# Patient Record
Sex: Female | Born: 1935 | Race: White | Hispanic: No | Marital: Single | Smoking: Never smoker
Health system: Southern US, Community
[De-identification: ages and names within clinical notes are randomized; demographics above are authoritative.]

## PROBLEM LIST (undated history)

## (undated) DIAGNOSIS — I4891 Unspecified atrial fibrillation: Secondary | ICD-10-CM

## (undated) DIAGNOSIS — E785 Hyperlipidemia, unspecified: Secondary | ICD-10-CM

## (undated) DIAGNOSIS — I10 Essential (primary) hypertension: Secondary | ICD-10-CM

## (undated) DIAGNOSIS — G4733 Obstructive sleep apnea (adult) (pediatric): Secondary | ICD-10-CM

---

## 2018-01-17 ENCOUNTER — Encounter (HOSPITAL_COMMUNITY): Payer: Self-pay | Admitting: Internal Medicine

## 2018-01-17 ENCOUNTER — Emergency Department (HOSPITAL_COMMUNITY): Payer: Medicare Other

## 2018-01-17 ENCOUNTER — Inpatient Hospital Stay (HOSPITAL_COMMUNITY)
Admission: EM | Admit: 2018-01-17 | Discharge: 2018-01-30 | DRG: 291 | Disposition: A | Discharge status: Skilled Nursing Facility | Payer: Medicare Other | Attending: Internal Medicine | Admitting: Internal Medicine

## 2018-01-17 DIAGNOSIS — I4819 Other persistent atrial fibrillation: Secondary | ICD-10-CM | POA: Diagnosis present

## 2018-01-17 DIAGNOSIS — I110 Hypertensive heart disease with heart failure: Secondary | ICD-10-CM | POA: Diagnosis not present

## 2018-01-17 DIAGNOSIS — Z79899 Other long term (current) drug therapy: Secondary | ICD-10-CM

## 2018-01-17 DIAGNOSIS — I6529 Occlusion and stenosis of unspecified carotid artery: Secondary | ICD-10-CM | POA: Diagnosis present

## 2018-01-17 DIAGNOSIS — Z7401 Bed confinement status: Secondary | ICD-10-CM

## 2018-01-17 DIAGNOSIS — I878 Other specified disorders of veins: Secondary | ICD-10-CM | POA: Diagnosis present

## 2018-01-17 DIAGNOSIS — I4891 Unspecified atrial fibrillation: Secondary | ICD-10-CM | POA: Diagnosis present

## 2018-01-17 DIAGNOSIS — Z6835 Body mass index (BMI) 35.0-35.9, adult: Secondary | ICD-10-CM

## 2018-01-17 DIAGNOSIS — I493 Ventricular premature depolarization: Secondary | ICD-10-CM | POA: Diagnosis present

## 2018-01-17 DIAGNOSIS — D649 Anemia, unspecified: Secondary | ICD-10-CM | POA: Diagnosis present

## 2018-01-17 DIAGNOSIS — I509 Heart failure, unspecified: Secondary | ICD-10-CM

## 2018-01-17 DIAGNOSIS — I10 Essential (primary) hypertension: Secondary | ICD-10-CM | POA: Diagnosis present

## 2018-01-17 DIAGNOSIS — I6930 Unspecified sequelae of cerebral infarction: Secondary | ICD-10-CM

## 2018-01-17 DIAGNOSIS — I2729 Other secondary pulmonary hypertension: Secondary | ICD-10-CM | POA: Diagnosis present

## 2018-01-17 DIAGNOSIS — I69392 Facial weakness following cerebral infarction: Secondary | ICD-10-CM

## 2018-01-17 DIAGNOSIS — Z8249 Family history of ischemic heart disease and other diseases of the circulatory system: Secondary | ICD-10-CM

## 2018-01-17 DIAGNOSIS — G8929 Other chronic pain: Secondary | ICD-10-CM | POA: Diagnosis present

## 2018-01-17 DIAGNOSIS — Z7901 Long term (current) use of anticoagulants: Secondary | ICD-10-CM

## 2018-01-17 DIAGNOSIS — Z885 Allergy status to narcotic agent status: Secondary | ICD-10-CM

## 2018-01-17 DIAGNOSIS — E785 Hyperlipidemia, unspecified: Secondary | ICD-10-CM | POA: Diagnosis present

## 2018-01-17 DIAGNOSIS — J90 Pleural effusion, not elsewhere classified: Secondary | ICD-10-CM | POA: Diagnosis present

## 2018-01-17 DIAGNOSIS — I69354 Hemiplegia and hemiparesis following cerebral infarction affecting left non-dominant side: Secondary | ICD-10-CM

## 2018-01-17 DIAGNOSIS — R001 Bradycardia, unspecified: Secondary | ICD-10-CM | POA: Clinically undetermined

## 2018-01-17 DIAGNOSIS — Z79891 Long term (current) use of opiate analgesic: Secondary | ICD-10-CM

## 2018-01-17 DIAGNOSIS — J9601 Acute respiratory failure with hypoxia: Secondary | ICD-10-CM | POA: Diagnosis present

## 2018-01-17 DIAGNOSIS — E669 Obesity, unspecified: Secondary | ICD-10-CM | POA: Diagnosis present

## 2018-01-17 DIAGNOSIS — I5082 Biventricular heart failure: Secondary | ICD-10-CM | POA: Diagnosis present

## 2018-01-17 DIAGNOSIS — K5900 Constipation, unspecified: Secondary | ICD-10-CM | POA: Diagnosis not present

## 2018-01-17 DIAGNOSIS — I5033 Acute on chronic diastolic (congestive) heart failure: Secondary | ICD-10-CM | POA: Diagnosis present

## 2018-01-17 DIAGNOSIS — G4733 Obstructive sleep apnea (adult) (pediatric): Secondary | ICD-10-CM | POA: Diagnosis present

## 2018-01-17 DIAGNOSIS — Z9981 Dependence on supplemental oxygen: Secondary | ICD-10-CM

## 2018-01-17 DIAGNOSIS — I080 Rheumatic disorders of both mitral and aortic valves: Secondary | ICD-10-CM | POA: Diagnosis present

## 2018-01-17 DIAGNOSIS — R0602 Shortness of breath: Secondary | ICD-10-CM | POA: Diagnosis not present

## 2018-01-17 DIAGNOSIS — E876 Hypokalemia: Secondary | ICD-10-CM | POA: Diagnosis present

## 2018-01-17 DIAGNOSIS — Z888 Allergy status to other drugs, medicaments and biological substances status: Secondary | ICD-10-CM

## 2018-01-17 HISTORY — DX: Hyperlipidemia, unspecified: E78.5

## 2018-01-17 HISTORY — DX: Essential (primary) hypertension: I10

## 2018-01-17 HISTORY — DX: Unspecified atrial fibrillation: I48.91

## 2018-01-17 HISTORY — DX: Obstructive sleep apnea (adult) (pediatric): G47.33

## 2018-01-17 LAB — CBC WITH DIFFERENTIAL/PLATELET
Abs Immature Granulocytes: 0.07 10*3/uL (ref 0.00–0.07)
Basophils Absolute: 0 10*3/uL (ref 0.0–0.1)
Basophils Relative: 0 %
Eosinophils Absolute: 0.4 10*3/uL (ref 0.0–0.5)
Eosinophils Relative: 4 %
HCT: 35.7 % — ABNORMAL LOW (ref 36.0–46.0)
Hemoglobin: 11 g/dL — ABNORMAL LOW (ref 12.0–15.0)
IMMATURE GRANULOCYTES: 1 %
Lymphocytes Relative: 14 %
Lymphs Abs: 1.3 10*3/uL (ref 0.7–4.0)
MCH: 27.2 pg (ref 26.0–34.0)
MCHC: 30.8 g/dL (ref 30.0–36.0)
MCV: 88.1 fL (ref 80.0–100.0)
Monocytes Absolute: 0.9 10*3/uL (ref 0.1–1.0)
Monocytes Relative: 10 %
Neutro Abs: 6.9 10*3/uL (ref 1.7–7.7)
Neutrophils Relative %: 71 %
Platelets: 239 10*3/uL (ref 150–400)
RBC: 4.05 MIL/uL (ref 3.87–5.11)
RDW: 14.2 % (ref 11.5–15.5)
WBC: 9.6 10*3/uL (ref 4.0–10.5)
nRBC: 0 % (ref 0.0–0.2)

## 2018-01-17 LAB — BASIC METABOLIC PANEL
ANION GAP: 12 (ref 5–15)
BUN: 15 mg/dL (ref 8–23)
CALCIUM: 8.5 mg/dL — AB (ref 8.9–10.3)
CO2: 24 mmol/L (ref 22–32)
Chloride: 107 mmol/L (ref 98–111)
Creatinine, Ser: 0.64 mg/dL (ref 0.44–1.00)
GFR calc Af Amer: >60 mL/min (ref >60)
GFR calc non Af Amer: >60 mL/min (ref >60)
Glucose, Bld: 85 mg/dL (ref 70–99)
Potassium: 3.8 mmol/L (ref 3.5–5.1)
Sodium: 143 mmol/L (ref 135–145)

## 2018-01-17 LAB — I-STAT TROPONIN, ED: Troponin i, poc: 0 ng/mL (ref 0.00–0.08)

## 2018-01-17 LAB — BRAIN NATRIURETIC PEPTIDE: B Natriuretic Peptide: 295.6 pg/mL — ABNORMAL HIGH (ref 0.0–100.0)

## 2018-01-17 MED ORDER — ATORVASTATIN CALCIUM 40 MG PO TABS
40.0000 mg | ORAL_TABLET | Freq: Every evening | ORAL | Status: DC
  Administered 2018-01-18 – 2018-01-29 (×10): 40 mg via ORAL
  Filled 2018-01-17 (×11): qty 1

## 2018-01-17 MED ORDER — SODIUM CHLORIDE 0.9% FLUSH
3.0000 mL | Freq: Two times a day (BID) | INTRAVENOUS | Status: DC
  Administered 2018-01-17 – 2018-01-28 (×19): 3 mL via INTRAVENOUS

## 2018-01-17 MED ORDER — ACETAMINOPHEN 650 MG RE SUPP: 650.0000 mg | Freq: Four times a day (QID) | RECTAL | Status: DC | PRN

## 2018-01-17 MED ORDER — ACETAMINOPHEN 325 MG PO TABS
650.0000 mg | ORAL_TABLET | Freq: Four times a day (QID) | ORAL | Status: DC | PRN
  Administered 2018-01-21 – 2018-01-29 (×2): 650 mg via ORAL
  Filled 2018-01-17 (×3): qty 2

## 2018-01-17 MED ORDER — FUROSEMIDE 10 MG/ML IJ SOLN
80.0000 mg | Freq: Once | INTRAMUSCULAR | Status: AC
  Administered 2018-01-17: 80 mg via INTRAVENOUS
  Filled 2018-01-17: qty 8

## 2018-01-17 MED ORDER — PANTOPRAZOLE SODIUM 40 MG PO TBEC
40.0000 mg | DELAYED_RELEASE_TABLET | Freq: Every day | ORAL | Status: DC
  Administered 2018-01-18 – 2018-01-30 (×13): 40 mg via ORAL
  Filled 2018-01-17 (×13): qty 1

## 2018-01-17 MED ORDER — POTASSIUM CHLORIDE CRYS ER 20 MEQ PO TBCR
20.0000 meq | EXTENDED_RELEASE_TABLET | Freq: Every day | ORAL | Status: DC
  Administered 2018-01-18 – 2018-01-20 (×3): 20 meq via ORAL
  Filled 2018-01-17 (×4): qty 1

## 2018-01-17 MED ORDER — FUROSEMIDE 10 MG/ML IJ SOLN
80.0000 mg | Freq: Two times a day (BID) | INTRAMUSCULAR | Status: DC
  Administered 2018-01-18 – 2018-01-20 (×6): 80 mg via INTRAVENOUS
  Filled 2018-01-17 (×7): qty 8

## 2018-01-17 MED ORDER — APIXABAN 2.5 MG PO TABS
2.5000 mg | ORAL_TABLET | Freq: Two times a day (BID) | ORAL | Status: DC
  Administered 2018-01-18 – 2018-01-21 (×8): 2.5 mg via ORAL
  Filled 2018-01-17 (×8): qty 1

## 2018-01-17 MED ORDER — PREGABALIN 25 MG PO CAPS
25.0000 mg | ORAL_CAPSULE | Freq: Two times a day (BID) | ORAL | Status: DC
  Administered 2018-01-18 – 2018-01-30 (×26): 25 mg via ORAL
  Filled 2018-01-17 (×26): qty 1

## 2018-01-17 MED ORDER — ALBUTEROL SULFATE (2.5 MG/3ML) 0.083% IN NEBU: 3.0000 mL | INHALATION_SOLUTION | RESPIRATORY_TRACT | Status: DC | PRN

## 2018-01-17 MED ORDER — OXYCODONE-ACETAMINOPHEN 10-325 MG PO TABS: 1.0000 | ORAL_TABLET | Freq: Two times a day (BID) | ORAL | Status: DC

## 2018-01-17 MED ORDER — AMITRIPTYLINE HCL 10 MG PO TABS
10.0000 mg | ORAL_TABLET | Freq: Every day | ORAL | Status: DC
  Administered 2018-01-18 – 2018-01-29 (×13): 10 mg via ORAL
  Filled 2018-01-17 (×13): qty 1

## 2018-01-17 MED ORDER — AMLODIPINE BESYLATE 5 MG PO TABS
5.0000 mg | ORAL_TABLET | Freq: Every day | ORAL | Status: DC
  Administered 2018-01-18 – 2018-01-22 (×5): 5 mg via ORAL
  Filled 2018-01-17 (×5): qty 1

## 2018-01-17 NOTE — H&P (Signed)
History and Physical    Margaret Collier WJX:[id number] DOB: Sep 17, 1931 DOA: 01/17/2018  PCP: System, Pcp Not In  Patient coming from: [facility] nursing facility  I have personally briefly reviewed patient's old medical records in [facility]  Chief Complaint: Shortness of breath  HPI: Margaret Collier is a 84 y.o. female with medical history significant for history of CVA with residual left-sided paralysis, atrial fibrillation on Eliquis, hypertension, hyperlipidemia, reported OSA who presents from her nursing facility with progressive dyspnea and peripheral edema not improved with oral Lasix.  Patient reportedly had a chest x-ray done at her nursing facility which was suggestive of pneumonia.  She was started on Levaquin yesterday however transferred to ED due to continued shortness of breath.  Patient reports about 1 week of progressive shortness of breath associated with dry cough.  She says she has been receiving Lasix and reports good urine output without significant improvement.  She has had worsening peripheral edema.  She denies any chest pain, palpitations, fever, chills, diaphoresis, abdominal pain, or dysuria.  ED Course:  Initial vitals showed BP 121/39, pulse 62, RR 15, temp 80F, SPO2 99% on room air.  Labs are notable for BNP 295.6, negative i-STAT troponin.  CBC and BMP were largely unremarkable.  Patient became hypoxic in the ED requiring supplemental oxygen with improvement.  Chest x-ray showed right hemidiaphragm elevation with possible small left pleural effusion.  She was given Lasix 80 mg once and the hospital service was consulted for suspected CHF exacerbation.  Review of Systems: As per HPI otherwise 10 point review of systems negative.    Past Medical History:  Diagnosis Date  . Atrial fibrillation (HCC)   . Hyperlipidemia   . Hypertension   . OSA (obstructive sleep apnea)     History reviewed. No pertinent surgical history.   reports that she has never  smoked. She has never used smokeless tobacco. No history on file for alcohol and drug.  Allergies  Allergen Reactions  . Chlorthalidone     On MAR  . Dilaudid [Hydromorphone Hcl]     On MAR  . Fosamax [Alendronate Sodium]     On MAR  . Zanaflex [Tizanidine Hcl]     On MAR    Family History  Problem Relation Age of Onset  . Heart disease Mother      Prior to Admission medications   Medication Sig Start Date End Date Taking? Authorizing Provider  albuterol (PROVENTIL HFA;VENTOLIN HFA) 108 (90 Base) MCG/ACT inhaler Inhale 2 puffs into the lungs every 4 (four) hours as needed for wheezing or shortness of breath.   Yes [provider]  amitriptyline (ELAVIL) 10 MG tablet Take 10 mg by mouth at bedtime.   Yes [provider]  amLODipine (NORVASC) 5 MG tablet Take 5 mg by mouth 2 (two) times daily.   Yes [provider]  apixaban (ELIQUIS) 2.5 MG TABS tablet Take 2.5 mg by mouth 2 (two) times daily.   Yes [provider]  atorvastatin (LIPITOR) 40 MG tablet Take 40 mg by mouth every evening.   Yes [provider]  b complex vitamins capsule Take 1 capsule by mouth daily.   Yes [provider]  calcium carbonate (TUMS - DOSED IN MG ELEMENTAL CALCIUM) 500 MG chewable tablet Chew 1 tablet by mouth 3 (three) times daily before meals.   Yes [provider]  chlorhexidine (PERIDEX) 0.12 % solution Use as directed 15 mLs in the mouth or throat 2 (two) times daily.  Yes [provider]  cholecalciferol (VITAMIN D3) 25 MCG (1000 UT) tablet Take 1,000 Units by mouth daily.   Yes [provider]  furosemide (LASIX) 40 MG tablet Take 40 mg by mouth daily.   Yes [provider]  ipratropium-albuterol (DUONEB) 0.5-2.5 (3) MG/3ML SOLN Take 3 mLs by nebulization 3 (three) times daily. x7 days.   Yes [provider]  levofloxacin (LEVAQUIN) 500 MG tablet Take 500 mg by mouth daily. x7 days.   Yes [provider]  oxyCODONE-acetaminophen (PERCOCET) 10-325 MG tablet Take 1 tablet by mouth every 12 (twelve) hours.   Yes [provider]  OXYGEN Inhale 2 L/min into the lungs at bedtime.   Yes [provider]  pantoprazole (PROTONIX) 40 MG tablet Take 40 mg by mouth daily.   Yes [provider]  potassium chloride SA (K-DUR,KLOR-CON) 20 MEQ tablet Take 20 mEq by mouth daily.   Yes [provider]  pregabalin (LYRICA) 25 MG capsule Take 25 mg by mouth 2 (two) times daily.   Yes [provider]    Physical Exam: Vitals:   01/17/18 1746 01/17/18 2000 01/17/18 2239 01/18/18 0019  BP: (!) 121/39 110/72 (!) 123/58 (!) 125/46  Pulse: 62 65 68 68  Resp: 15 16 18 17   Temp: 98 F (36.7 C)   98.3 F (36.8 C)  TempSrc: Oral   Oral  SpO2: 99% 100% 97% 98%  Weight:    90.8 kg  Height:    5\' 2"  (1.575 m)    Constitutional: NAD, calm, comfortable Eyes: PERRL, lids and conjunctivae normal ENMT: Mucous membranes are moist. Posterior pharynx clear of any exudate or lesions.Normal dentition.  Neck: normal, supple, no masses. Respiratory: clear to auscultation anteriorly, normal respiratory effort. No accessory muscle use.  Cardiovascular: Regular rate and rhythm, soft systolic murmur. +3 pitting edema both legs. Abdomen: no tenderness, no masses palpated. No hepatosplenomegaly. Bowel sounds positive.  Musculoskeletal: Left-sided paralysis, range of motion right side intact Skin: Chronic venous stasis changes Neurologic: Chronic left-sided paralysis with left facial droop, able to move right upper and lower extremities.  Sensation intact bilaterally. Psychiatric:. Alert and oriented x 3, tangential speech    Labs on Admission: I have personally reviewed following labs and imaging studies  CBC: Recent Labs  Lab 01/17/18 1647  WBC 9.6  NEUTROABS 6.9  HGB 11.0*  HCT 35.7*  MCV 88.1  PLT 239   Basic Metabolic Panel: Recent Labs  Lab  01/17/18 1647  NA 143  K 3.8  CL 107  CO2 24  GLUCOSE 85  BUN 15  CREATININE 0.64  CALCIUM 8.5*   GFR: Estimated Creatinine Clearance: 54.9 mL/min (by C-G formula based on SCr of 0.64 mg/dL). Liver Function Tests: No results for input(s): AST, ALT, ALKPHOS, BILITOT, PROT, ALBUMIN in the last 168 hours. No results for input(s): LIPASE, AMYLASE in the last 168 hours. No results for input(s): AMMONIA in the last 168 hours. Coagulation Profile: No results for input(s): INR, PROTIME in the last 168 hours. Cardiac Enzymes: No results for input(s): CKTOTAL, CKMB, CKMBINDEX, TROPONINI in the last 168 hours. BNP (last 3 results) No results for input(s): PROBNP in the last 8760 hours. HbA1C: No results for input(s): HGBA1C in the last 72 hours. CBG: No results for input(s): GLUCAP in the last 168 hours. Lipid Profile: No results for input(s): CHOL, HDL, LDLCALC, TRIG, CHOLHDL, LDLDIRECT in the last 72 hours. Thyroid Function Tests: No results for input(s): TSH, T4TOTAL, FREET4, T3FREE, THYROIDAB  in the last 72 hours. Anemia Panel: No results for input(s): VITAMINB12, FOLATE, FERRITIN, TIBC, IRON, RETICCTPCT in the last 72 hours. Urine analysis: No results found for: COLORURINE, APPEARANCEUR, LABSPEC, PHURINE, GLUCOSEU, HGBUR, BILIRUBINUR, KETONESUR, PROTEINUR, UROBILINOGEN, NITRITE, LEUKOCYTESUR  Radiological Exams on Admission: Dg Chest 2 View  Result Date: 01/17/2018 CLINICAL DATA:  Shortness of breath. EXAM: CHEST - 2 VIEW COMPARISON:  None. FINDINGS: Cardiomegaly. Normal pulmonary vascularity. Atherosclerotic calcification of the aortic arch. Possible small left pleural effusion. No consolidation or pneumothorax. Elevation of the right hemidiaphragm. No acute osseous abnormality. IMPRESSION: 1. Cardiomegaly.  Possible small left pleural effusion. Electronically Signed   By: Obie DredgeWilliam T Derry M.D.   On: 01/17/2018 17:45    EKG: Independently reviewed.  Sinus arrhythmia, rate 62 bpm,  IVCD, motion artifact  Assessment/Plan Principal Problem:   Acute exacerbation of CHF (congestive heart failure) (HCC) Active Problems:   Atrial fibrillation (HCC)   Hypertension   Hyperlipidemia   OSA (obstructive sleep apnea)   History of CVA (cerebrovascular accident) without residual deficits   Arlina RobesDorothy Almendariz is a 84 y.o. female with medical history significant for history of CVA with residual left-sided paralysis, atrial fibrillation on Eliquis, hypertension, hyperlipidemia, reported OSA who presents from her nursing facility with progressive dyspnea and peripheral edema.  Acute exacerbation of suspected CHF: Progressive shortness of peripheral edema suspicious for underlying CHF.  Likely also has chronic venous stasis.  No obvious infection, will hold antibiotics. -Continue IV Lasix 80 mg daily -Strict I&O's, daily weights -Echocardiogram -Monitor electrolytes and renal function  Atrial fibrillation on Eliquis: Currently rate controlled. -Continue Eliquis  Hypertension: -Continue amlodipine  Hyperlipidemia: -Continue atorvastatin  History of CVA with residual left-sided paralysis and chronic pain: -Continue home oxycodone-APAP, Lyrica  OSA: -CPAP nightly   DVT prophylaxis: Eliquis Code Status: Full code Family Communication: None present on admission Disposition Plan: Pending improvement in respiratory status and cardiac work-up Consults called: None Admission status: Observation   Darreld McleanVishal Guido Comp MD [facility] Pager [phone]  If 7PM-7AM, please contact night-coverage [email or web address] Password [email or web address]  01/18/2018, 2:00 AM

## 2018-01-17 NOTE — ED Provider Notes (Signed)
[facility] EMERGENCY DEPARTMENT Provider Note   CSN: [id number] Arrival date & time: 01/17/18  1640     History   Chief Complaint Chief Complaint  Patient presents with  . Shortness of Breath    HPI Margaret Collier is a 84 y.o. female.  The history is provided by the patient. No language interpreter was used.  Shortness of Breath  This is a new problem. The average episode lasts 2 days. Associated symptoms include cough, leg pain and leg swelling. Pertinent negatives include no fever, no sore throat, no ear pain, no sputum production, no wheezing, no chest pain, no vomiting, no abdominal pain and no rash. Treatments tried: daily lasix. The treatment provided no relief. Associated medical issues include heart failure and DVT (on blood thinner).    Past Medical History:  Diagnosis Date  . Atrial fibrillation (HCC)   . Hyperlipidemia   . Hypertension   . OSA (obstructive sleep apnea)     Patient Active Problem List   Diagnosis Date Noted  . Acute exacerbation of CHF (congestive heart failure) (HCC) 01/17/2018  . Atrial fibrillation (HCC) 01/17/2018  . Hypertension 01/17/2018  . Hyperlipidemia 01/17/2018  . OSA (obstructive sleep apnea) 01/17/2018  . History of CVA (cerebrovascular accident) without residual deficits 01/17/2018    OB History   No obstetric history on file.      Home Medications    Prior to Admission medications   Medication Sig Start Date End Date Taking? Authorizing Provider  albuterol (PROVENTIL HFA;VENTOLIN HFA) 108 (90 Base) MCG/ACT inhaler Inhale 2 puffs into the lungs every 4 (four) hours as needed for wheezing or shortness of breath.   Yes [provider]  amitriptyline (ELAVIL) 10 MG tablet Take 10 mg by mouth at bedtime.   Yes [provider]  amLODipine (NORVASC) 5 MG tablet Take 5 mg by mouth 2 (two) times daily.   Yes [provider]  apixaban (ELIQUIS) 2.5 MG TABS tablet Take 2.5 mg by  mouth 2 (two) times daily.   Yes [provider]  atorvastatin (LIPITOR) 40 MG tablet Take 40 mg by mouth every evening.   Yes [provider]  b complex vitamins capsule Take 1 capsule by mouth daily.   Yes [provider]  calcium carbonate (TUMS - DOSED IN MG ELEMENTAL CALCIUM) 500 MG chewable tablet Chew 1 tablet by mouth 3 (three) times daily before meals.   Yes [provider]  chlorhexidine (PERIDEX) 0.12 % solution Use as directed 15 mLs in the mouth or throat 2 (two) times daily.   Yes [provider]  cholecalciferol (VITAMIN D3) 25 MCG (1000 UT) tablet Take 1,000 Units by mouth daily.   Yes [provider]  furosemide (LASIX) 40 MG tablet Take 40 mg by mouth daily.   Yes [provider]  ipratropium-albuterol (DUONEB) 0.5-2.5 (3) MG/3ML SOLN Take 3 mLs by nebulization 3 (three) times daily. x7 days.   Yes [provider]  levofloxacin (LEVAQUIN) 500 MG tablet Take 500 mg by mouth daily. x7 days.   Yes [provider]  oxyCODONE-acetaminophen (PERCOCET) 10-325 MG tablet Take 1 tablet by mouth every 12 (twelve) hours.   Yes [provider]  OXYGEN Inhale 2 L/min into the lungs at bedtime.   Yes [provider]  pantoprazole (PROTONIX) 40 MG tablet Take 40 mg by mouth daily.   Yes [provider]  potassium chloride SA (K-DUR,KLOR-CON) 20 MEQ tablet Take 20 mEq by mouth daily.  Yes [provider]  pregabalin (LYRICA) 25 MG capsule Take 25 mg by mouth 2 (two) times daily.   Yes [provider]    Family History Family History  Problem Relation Age of Onset  . Heart disease Mother     Social History Social History   Tobacco Use  . Smoking status: Never Smoker  . Smokeless tobacco: Never Used  Substance Use Topics  . Alcohol use: Not on file  . Drug use: Not on file     Allergies   Chlorthalidone; Dilaudid [hydromorphone hcl]; Fosamax [alendronate  sodium]; and Zanaflex [tizanidine hcl]   Review of Systems Review of Systems  Constitutional: Positive for activity change. Negative for chills, fatigue and fever.  HENT: Negative for ear pain and sore throat.   Eyes: Negative for pain and visual disturbance.  Respiratory: Positive for cough and shortness of breath. Negative for sputum production and wheezing.   Cardiovascular: Positive for leg swelling. Negative for chest pain and palpitations.  Gastrointestinal: Negative for abdominal pain and vomiting.  Genitourinary: Negative for dysuria and hematuria.  Musculoskeletal: Negative for arthralgias and back pain.  Skin: Negative for color change and rash.  Neurological: Negative for seizures and syncope.  All other systems reviewed and are negative.    Physical Exam Updated Vital Signs BP (!) 123/58   Pulse 68   Temp 98 F (36.7 C) (Oral)   Resp 18   SpO2 97%   Physical Exam Vitals signs and nursing note reviewed.  Constitutional:      General: She is not in acute distress.    Appearance: She is well-developed.  HENT:     Head: Normocephalic and atraumatic.  Eyes:     Conjunctiva/sclera: Conjunctivae normal.  Neck:     Musculoskeletal: Neck supple.  Cardiovascular:     Rate and Rhythm: Normal rate and regular rhythm.     Heart sounds: No murmur.  Pulmonary:     Effort: Pulmonary effort is normal. No respiratory distress.     Breath sounds: Normal breath sounds.  Abdominal:     Palpations: Abdomen is soft.     Tenderness: There is no abdominal tenderness.  Musculoskeletal:     Right lower leg: 2+ Pitting Edema present.     Left lower leg: 2+ Pitting Edema present.  Skin:    General: Skin is warm and dry.  Neurological:     Mental Status: She is alert.      ED Treatments / Results  Labs (all labs ordered are listed, but only abnormal results are displayed) Labs Reviewed  BASIC METABOLIC PANEL - Abnormal; Notable for the following components:      Result  Value   Calcium 8.5 (*)    All other components within normal limits  CBC WITH DIFFERENTIAL/PLATELET - Abnormal; Notable for the following components:   Hemoglobin 11.0 (*)    HCT 35.7 (*)    All other components within normal limits  BRAIN NATRIURETIC PEPTIDE - Abnormal; Notable for the following components:   B Natriuretic Peptide 295.6 (*)    All other components within normal limits  BASIC METABOLIC PANEL  CBC  I-STAT TROPONIN, ED    EKG None  Radiology Dg Chest 2 View  Result Date: 01/17/2018 CLINICAL DATA:  Shortness of breath. EXAM: CHEST - 2 VIEW COMPARISON:  None. FINDINGS: Cardiomegaly. Normal pulmonary vascularity. Atherosclerotic calcification of the aortic arch. Possible small left pleural effusion. No consolidation or pneumothorax. Elevation of the right hemidiaphragm. No acute osseous abnormality.  IMPRESSION: 1. Cardiomegaly.  Possible small left pleural effusion. Electronically Signed   By: Obie Dredge M.D.   On: 01/17/2018 17:45    Procedures Procedures (including critical care time)  Medications Ordered in ED Medications  sodium chloride flush (NS) 0.9 % injection 3 mL (3 mLs Intravenous Given 01/17/18 2257)  acetaminophen (TYLENOL) tablet 650 mg (has no administration in time range)    Or  acetaminophen (TYLENOL) suppository 650 mg (has no administration in time range)  furosemide (LASIX) injection 80 mg (has no administration in time range)  albuterol (PROVENTIL) (2.5 MG/3ML) 0.083% nebulizer solution 3 mL (has no administration in time range)  amitriptyline (ELAVIL) tablet 10 mg (has no administration in time range)  amLODipine (NORVASC) tablet 5 mg (has no administration in time range)  apixaban (ELIQUIS) tablet 2.5 mg (has no administration in time range)  atorvastatin (LIPITOR) tablet 40 mg (has no administration in time range)  oxyCODONE-acetaminophen (PERCOCET) 10-325 MG per tablet 1 tablet (has no administration in time range)  pantoprazole  (PROTONIX) EC tablet 40 mg (has no administration in time range)  potassium chloride SA (K-DUR,KLOR-CON) CR tablet 20 mEq (has no administration in time range)  pregabalin (LYRICA) capsule 25 mg (has no administration in time range)  furosemide (LASIX) injection 80 mg (80 mg Intravenous Given 01/17/18 2236)     Initial Impression / Assessment and Plan / ED Course  I have reviewed the triage vital signs and the nursing notes.  Pertinent labs & imaging results that were available during my care of the patient were reviewed by me and considered in my medical decision making (see chart for details).    Patient is a 84 year old female with past medical history significant for CVA, A. fib, DVT on Xarelto presenting for respiratory distress and difficulty with shortness of breath for the past few days.  Physical exam shows that patient was hypoxic on room air to 88%, placed on 2 L nasal cannula with good result, bilateral lower extremity 2+ pitting edema.  Chest x-ray shows small pleural effusion and cardiomegaly.  BNP elevated.  Patient will be admitted for CHF exacerbation.  80 of IV Lasix was given while in the ED.  Final Clinical Impressions(s) / ED Diagnoses   Final diagnoses:  Acute on chronic congestive heart failure, unspecified heart failure type [facility])    ED Discharge Orders    None       Joaquin Courts, MD 01/17/18 1610    Blane Ohara, MD 01/19/18 Kandee Keen    Blane Ohara, MD 02/10/18 1534

## 2018-01-17 NOTE — ED Triage Notes (Addendum)
[facility] after staff report pt being SOB w/ increased LE edema x3 weeks. Pt is alert and oriented but occasionally becomes confused. Per facility, this is not the pt's baseline. Has hx of stroke w/ left sided paralysis. Pitting edema noted to both LE at triage. Pt also has a-fib. EMS v/s 97% 2lpm [place], hr 70, 119/51, cbg 134.

## 2018-01-17 NOTE — ED Notes (Signed)
Purewick placed on patient.

## 2018-01-18 ENCOUNTER — Other Ambulatory Visit: Payer: Self-pay

## 2018-01-18 ENCOUNTER — Encounter (HOSPITAL_COMMUNITY): Payer: Self-pay | Admitting: Internal Medicine

## 2018-01-18 ENCOUNTER — Other Ambulatory Visit (HOSPITAL_COMMUNITY): Payer: Medicare Other

## 2018-01-18 DIAGNOSIS — I5033 Acute on chronic diastolic (congestive) heart failure: Secondary | ICD-10-CM | POA: Diagnosis present

## 2018-01-18 DIAGNOSIS — R0602 Shortness of breath: Secondary | ICD-10-CM | POA: Diagnosis present

## 2018-01-18 DIAGNOSIS — D649 Anemia, unspecified: Secondary | ICD-10-CM | POA: Diagnosis present

## 2018-01-18 DIAGNOSIS — I4891 Unspecified atrial fibrillation: Secondary | ICD-10-CM | POA: Diagnosis not present

## 2018-01-18 DIAGNOSIS — Z9981 Dependence on supplemental oxygen: Secondary | ICD-10-CM | POA: Diagnosis not present

## 2018-01-18 DIAGNOSIS — E669 Obesity, unspecified: Secondary | ICD-10-CM | POA: Diagnosis present

## 2018-01-18 DIAGNOSIS — Z8673 Personal history of transient ischemic attack (TIA), and cerebral infarction without residual deficits: Secondary | ICD-10-CM | POA: Diagnosis not present

## 2018-01-18 DIAGNOSIS — I5043 Acute on chronic combined systolic (congestive) and diastolic (congestive) heart failure: Secondary | ICD-10-CM | POA: Diagnosis not present

## 2018-01-18 DIAGNOSIS — I878 Other specified disorders of veins: Secondary | ICD-10-CM | POA: Diagnosis present

## 2018-01-18 DIAGNOSIS — I6529 Occlusion and stenosis of unspecified carotid artery: Secondary | ICD-10-CM | POA: Diagnosis present

## 2018-01-18 DIAGNOSIS — I69392 Facial weakness following cerebral infarction: Secondary | ICD-10-CM | POA: Diagnosis not present

## 2018-01-18 DIAGNOSIS — J9601 Acute respiratory failure with hypoxia: Secondary | ICD-10-CM | POA: Diagnosis present

## 2018-01-18 DIAGNOSIS — I080 Rheumatic disorders of both mitral and aortic valves: Secondary | ICD-10-CM | POA: Diagnosis present

## 2018-01-18 DIAGNOSIS — J90 Pleural effusion, not elsewhere classified: Secondary | ICD-10-CM | POA: Diagnosis present

## 2018-01-18 DIAGNOSIS — I509 Heart failure, unspecified: Secondary | ICD-10-CM | POA: Diagnosis not present

## 2018-01-18 DIAGNOSIS — Z7401 Bed confinement status: Secondary | ICD-10-CM | POA: Diagnosis not present

## 2018-01-18 DIAGNOSIS — E785 Hyperlipidemia, unspecified: Secondary | ICD-10-CM | POA: Diagnosis present

## 2018-01-18 DIAGNOSIS — I493 Ventricular premature depolarization: Secondary | ICD-10-CM | POA: Diagnosis present

## 2018-01-18 DIAGNOSIS — E7800 Pure hypercholesterolemia, unspecified: Secondary | ICD-10-CM | POA: Diagnosis not present

## 2018-01-18 DIAGNOSIS — I4819 Other persistent atrial fibrillation: Secondary | ICD-10-CM | POA: Diagnosis present

## 2018-01-18 DIAGNOSIS — I5023 Acute on chronic systolic (congestive) heart failure: Secondary | ICD-10-CM | POA: Diagnosis not present

## 2018-01-18 DIAGNOSIS — I482 Chronic atrial fibrillation, unspecified: Secondary | ICD-10-CM | POA: Diagnosis not present

## 2018-01-18 DIAGNOSIS — E876 Hypokalemia: Secondary | ICD-10-CM | POA: Diagnosis present

## 2018-01-18 DIAGNOSIS — Z8249 Family history of ischemic heart disease and other diseases of the circulatory system: Secondary | ICD-10-CM | POA: Diagnosis not present

## 2018-01-18 DIAGNOSIS — Z6835 Body mass index (BMI) 35.0-35.9, adult: Secondary | ICD-10-CM | POA: Diagnosis not present

## 2018-01-18 DIAGNOSIS — G8929 Other chronic pain: Secondary | ICD-10-CM | POA: Diagnosis present

## 2018-01-18 DIAGNOSIS — I10 Essential (primary) hypertension: Secondary | ICD-10-CM | POA: Diagnosis not present

## 2018-01-18 DIAGNOSIS — I110 Hypertensive heart disease with heart failure: Secondary | ICD-10-CM | POA: Diagnosis present

## 2018-01-18 DIAGNOSIS — I5082 Biventricular heart failure: Secondary | ICD-10-CM | POA: Diagnosis present

## 2018-01-18 DIAGNOSIS — G4733 Obstructive sleep apnea (adult) (pediatric): Secondary | ICD-10-CM | POA: Diagnosis present

## 2018-01-18 DIAGNOSIS — I480 Paroxysmal atrial fibrillation: Secondary | ICD-10-CM | POA: Diagnosis not present

## 2018-01-18 DIAGNOSIS — I4811 Longstanding persistent atrial fibrillation: Secondary | ICD-10-CM | POA: Diagnosis not present

## 2018-01-18 DIAGNOSIS — K5900 Constipation, unspecified: Secondary | ICD-10-CM | POA: Diagnosis not present

## 2018-01-18 DIAGNOSIS — I69354 Hemiplegia and hemiparesis following cerebral infarction affecting left non-dominant side: Secondary | ICD-10-CM | POA: Diagnosis not present

## 2018-01-18 DIAGNOSIS — R001 Bradycardia, unspecified: Secondary | ICD-10-CM | POA: Diagnosis not present

## 2018-01-18 DIAGNOSIS — I2729 Other secondary pulmonary hypertension: Secondary | ICD-10-CM | POA: Diagnosis present

## 2018-01-18 LAB — BASIC METABOLIC PANEL
Anion gap: 14 (ref 5–15)
BUN: 14 mg/dL (ref 8–23)
CO2: 28 mmol/L (ref 22–32)
Calcium: 9 mg/dL (ref 8.9–10.3)
Chloride: 100 mmol/L (ref 98–111)
Creatinine, Ser: 0.76 mg/dL (ref 0.44–1.00)
GFR calc Af Amer: >60 mL/min (ref >60)
GFR calc non Af Amer: >60 mL/min (ref >60)
Glucose, Bld: 98 mg/dL (ref 70–99)
Potassium: 3.2 mmol/L — ABNORMAL LOW (ref 3.5–5.1)
Sodium: 142 mmol/L (ref 135–145)

## 2018-01-18 LAB — CBC
HCT: 34.9 % — ABNORMAL LOW (ref 36.0–46.0)
Hemoglobin: 10.9 g/dL — ABNORMAL LOW (ref 12.0–15.0)
MCH: 26.8 pg (ref 26.0–34.0)
MCHC: 31.2 g/dL (ref 30.0–36.0)
MCV: 86 fL (ref 80.0–100.0)
Platelets: 270 10*3/uL (ref 150–400)
RBC: 4.06 MIL/uL (ref 3.87–5.11)
RDW: 14.3 % (ref 11.5–15.5)
WBC: 9.5 10*3/uL (ref 4.0–10.5)
nRBC: 0 % (ref 0.0–0.2)

## 2018-01-18 LAB — MRSA PCR SCREENING: MRSA by PCR: NEGATIVE

## 2018-01-18 LAB — GLUCOSE, CAPILLARY
GLUCOSE-CAPILLARY: 94 mg/dL (ref 70–99)
Glucose-Capillary: 124 mg/dL — ABNORMAL HIGH (ref 70–99)
Glucose-Capillary: 115 mg/dL — ABNORMAL HIGH (ref 70–99)

## 2018-01-18 MED ORDER — OXYCODONE-ACETAMINOPHEN 5-325 MG PO TABS
1.0000 | ORAL_TABLET | Freq: Two times a day (BID) | ORAL | Status: DC
  Administered 2018-01-18 – 2018-01-30 (×26): 1 via ORAL
  Filled 2018-01-18 (×26): qty 1

## 2018-01-18 MED ORDER — OXYCODONE HCL 5 MG PO TABS
5.0000 mg | ORAL_TABLET | Freq: Two times a day (BID) | ORAL | Status: DC
  Administered 2018-01-18 – 2018-01-30 (×26): 5 mg via ORAL
  Filled 2018-01-18 (×26): qty 1

## 2018-01-18 NOTE — H&P (Signed)
History and Physical    Margaret Collier UEA:[id number] DOB: Dec 14, 1931 DOA: 01/17/2018  PCP: System, Pcp Not In  Patient coming from: Observation status to inpatient status  I have personally briefly reviewed patient's old medical records in [facility]  Chief Complaint: Shortness of breath  HPI: Margaret Collier is a 84 y.o. female with medical history significant for history of CVA with residual left-sided paralysis, atrial fibrillation on Eliquis, hypertension, hyperlipidemia, reported OSA who presents from her nursing facility with progressive dyspnea and peripheral edema not improved with oral Lasix.  Patient reportedly had a chest x-ray done at her nursing facility which was suggestive of pneumonia.  She was started on Levaquin yesterday however transferred to ED due to continued shortness of breath.  Patient reports about 1 week of progressive shortness of breath associated with dry cough.  She says she has been receiving Lasix and reports good urine output without significant improvement.  She has had worsening peripheral edema.  She denies any chest pain, palpitations, fever, chills, diaphoresis, abdominal pain, or dysuria.  ED Course:  Initial vitals showed BP 121/39, pulse 62, RR 15, temp 62F, SPO2 99% on room air.  Labs are notable for BNP 295.6, negative i-STAT troponin.  CBC and BMP were largely unremarkable.  Patient became hypoxic in the ED requiring supplemental oxygen with improvement.  Chest x-ray showed right hemidiaphragm elevation with possible small left pleural effusion.  She was given Lasix 80 mg once and the hospital service was consulted for suspected CHF exacerbation.  Observation course: Received IV Lasix but continues to have massive amounts of anasarca.  She is extremely edematous up to her thighs.  She also has doughy edema of the hips.  She will require IV Lasix for at least another 36 to 42 hours.  He is avidly drinking and I have started a fluid restriction  of 1200 mL per 24 hours.  Echocardiogram as noted in my previous progress note.  Review of Systems: As per HPI otherwise all other systems reviewed and  negative.    Past Medical History:  Diagnosis Date  . Atrial fibrillation (HCC)   . Hyperlipidemia   . Hypertension   . OSA (obstructive sleep apnea)     History reviewed. No pertinent surgical history.  Social History   Social History Narrative  . Not on file     reports that she has never smoked. She has never used smokeless tobacco. No history on file for alcohol and drug.  Allergies  Allergen Reactions  . Chlorthalidone     On MAR  . Dilaudid [Hydromorphone Hcl]     On MAR  . Fosamax [Alendronate Sodium]     On MAR  . Zanaflex [Tizanidine Hcl]     On MAR    Family History  Problem Relation Age of Onset  . Heart disease Mother     Prior to Admission medications   Medication Sig Start Date End Date Taking? Authorizing Provider  albuterol (PROVENTIL HFA;VENTOLIN HFA) 108 (90 Base) MCG/ACT inhaler Inhale 2 puffs into the lungs every 4 (four) hours as needed for wheezing or shortness of breath.   Yes [provider]  amitriptyline (ELAVIL) 10 MG tablet Take 10 mg by mouth at bedtime.   Yes [provider]  amLODipine (NORVASC) 5 MG tablet Take 5 mg by mouth 2 (two) times daily.   Yes [provider]  apixaban (ELIQUIS) 2.5 MG TABS tablet Take 2.5 mg by mouth 2 (two) times daily.   Yes  [provider]  atorvastatin (LIPITOR) 40 MG tablet Take 40 mg by mouth every evening.   Yes [provider]  b complex vitamins capsule Take 1 capsule by mouth daily.   Yes [provider]  calcium carbonate (TUMS - DOSED IN MG ELEMENTAL CALCIUM) 500 MG chewable tablet Chew 1 tablet by mouth 3 (three) times daily before meals.   Yes [provider]  chlorhexidine (PERIDEX) 0.12 % solution Use as directed 15 mLs in the mouth or throat 2 (two) times daily.   Yes [provider]  cholecalciferol (VITAMIN D3) 25 MCG (1000 UT) tablet Take 1,000 Units by mouth daily.   Yes [provider]  furosemide (LASIX) 40 MG tablet Take 40 mg by mouth daily.   Yes [provider]  ipratropium-albuterol (DUONEB) 0.5-2.5 (3) MG/3ML SOLN Take 3 mLs by nebulization 3 (three) times daily. x7 days.   Yes [provider]  levofloxacin (LEVAQUIN) 500 MG tablet Take 500 mg by mouth daily. x7 days.   Yes [provider]  oxyCODONE-acetaminophen (PERCOCET) 10-325 MG tablet Take 1 tablet by mouth every 12 (twelve) hours.   Yes [provider]  OXYGEN Inhale 2 L/min into the lungs at bedtime.   Yes [provider]  pantoprazole (PROTONIX) 40 MG tablet Take 40 mg by mouth daily.   Yes [provider]  potassium chloride SA (K-DUR,KLOR-CON) 20 MEQ tablet Take 20 mEq by mouth daily.   Yes [provider]  pregabalin (LYRICA) 25 MG capsule Take 25 mg by mouth 2 (two) times daily.   Yes [provider]    Physical Exam:  Constitutional: Dyspnea with minimal exertion, calm, comfortable Vitals:   01/18/18 0019 01/18/18 0425 01/18/18 0900 01/18/18 1130  BP: (!) 125/46 (!) 105/47 (!) 112/92 127/81  Pulse: 68 63  62  Resp: 17 14 15 15   Temp: 98.3 F (36.8 C) 98 F (36.7 C) 97.8 F (36.6 C) 98 F (36.7 C)  TempSrc: Oral Oral Oral Oral  SpO2: 98%  94% 95%  Weight: 90.8 kg     Height: 5\' 2"  (1.575 m)      Eyes: PERRL, lids and conjunctivae normal ENMT: Mucous membranes are moist. Posterior pharynx clear of any exudate or lesions.Normal dentition.  Neck: normal, supple, no masses, no thyromegaly Respiratory: Basilar rales bilaterally, no wheezing. Normal respiratory effort. No accessory muscle use.  Cardiovascular: Regular rate and rhythm, no murmurs / rubs / gallops.  3+ extremity edema with anasarca to the hips with doughy edema of the bilateral hips. 2+ pedal pulses. No carotid bruits.  Abdomen: no  tenderness, no masses palpated. No hepatosplenomegaly. Bowel sounds positive.  Musculoskeletal: no clubbing / cyanosis. No joint deformity upper and lower extremities. Good ROM, no contractures. Normal muscle tone.  Skin: no rashes, lesions, ulcers. No induration Neurologic: CN 2-12 grossly intact. Sensation intact, DTR normal. Strength 5/5 in all 4.  Psychiatric: Normal judgment and insight. Alert and oriented x 3. Normal mood.    Labs on Admission: I have personally reviewed following labs and imaging studies  CBC: Recent Labs  Lab 01/17/18 1647 01/18/18 0211  WBC 9.6 9.5  NEUTROABS 6.9  --   HGB 11.0* 10.9*  HCT 35.7* 34.9*  MCV 88.1 86.0  PLT 239 270   Basic Metabolic Panel: Recent Labs  Lab 01/17/18 1647 01/18/18 0211  NA 143 142  K 3.8 3.2*  CL 107 100  CO2 24 28  GLUCOSE 85 98  BUN 15 14  CREATININE 0.64 0.76  CALCIUM 8.5* 9.0   GFR: Estimated Creatinine Clearance: 54.9 mL/min (by C-G formula based on SCr of 0.76 mg/dL). CBG: Recent Labs  Lab 01/18/18 1132  GLUCAP 124*    Radiological Exams on Admission: Dg Chest 2 View  Result Date: 01/17/2018 CLINICAL DATA:  Shortness of breath. EXAM: CHEST - 2 VIEW COMPARISON:  None. FINDINGS: Cardiomegaly. Normal pulmonary vascularity. Atherosclerotic calcification of the aortic arch. Possible small left pleural effusion. No consolidation or pneumothorax. Elevation of the right hemidiaphragm. No acute osseous abnormality. IMPRESSION: 1. Cardiomegaly.  Possible small left pleural effusion. Electronically Signed   By: Obie Dredge M.D.   On: 01/17/2018 17:45    EKG: Independently reviewed.  Sinus rhythm at 62 bpm  Assessment/Plan Principal Problem:   Acute exacerbation of CHF (congestive heart failure) (HCC) Active Problems:   Atrial fibrillation (HCC)   Hypertension   Hyperlipidemia   OSA (obstructive sleep apnea)   History of CVA (cerebrovascular accident) without residual deficits   Keimora Swartout is a  84 y.o. female with medical history significant for history of CVA with residual left-sided paralysis, atrial fibrillation on Eliquis, hypertension, hyperlipidemia, reported OSA who presents from her nursing facility with progressive dyspnea and peripheral edema.  Acute exacerbation of suspected CHF: Progressive shortness of peripheral edema suspicious for underlying CHF.  Likely also has chronic venous stasis.  No obvious infection, will hold antibiotics. -Continue IV Lasix 80 mg q 12 hours -Strict I&O's, daily weights, fluid restriction 1553ml/24hrs -Echocardiogram D/C'd as recent echo done -Monitor electrolytes and renal function  Atrial fibrillation on Eliquis: Currently rate controlled. -Continue Eliquis  Hypertension: -Continue amlodipine  Hyperlipidemia: -Continue atorvastatin  History of CVA with residual left-sided paralysis and chronic pain: -Continue home oxycodone-APAP, Lyrica  OSA: -CPAP nightly   DVT prophylaxis: Eliquis Code Status: Full code Family Communication: None present on evaluation Disposition Plan: Pending improvement in respiratory status and cardiac work-up, likely 48 to 72 hours Consults called: None Admission status: Admit from OBS    Lahoma Crocker MD FACP [facility] Pager [phone]  If 7PM-7AM, please contact night-coverage [email or web address] Password [email or web address]  01/18/2018, 1:58 PM

## 2018-01-18 NOTE — Progress Notes (Signed)
[facility]:  Normal left ventricular size and systolic function with no appreciable segmental abnormality. Ejection fraction is estimated at 65-70% Diastolic function appears indeterminate Normal left ventricular wall thickness Mild mitral annular calcification. Mild mitral regurgitation. Mild AI Mild aortic regurgitation. Estimated RVSP 55 mm Hg.  Will cancel echo for today.

## 2018-01-18 NOTE — Plan of Care (Signed)
  Problem: Education: Goal: Knowledge of General Education information will improve Description Including pain rating scale, medication(s)/side effects and non-pharmacologic comfort measures Outcome: Progressing   Problem: Health Behavior/Discharge Planning: Goal: Ability to manage health-related needs will improve Outcome: Progressing   Problem: Clinical Measurements: Goal: Ability to maintain clinical measurements within normal limits will improve Outcome: Progressing Goal: Will remain free from infection Outcome: Progressing Goal: Diagnostic test results will improve Outcome: Progressing Goal: Respiratory complications will improve Outcome: Progressing Goal: Cardiovascular complication will be avoided Outcome: Progressing   Problem: Nutrition: Goal: Adequate nutrition will be maintained Outcome: Progressing   Problem: Coping: Goal: Level of anxiety will decrease Outcome: Progressing   Problem: Elimination: Goal: Will not experience complications related to bowel motility Outcome: Progressing Goal: Will not experience complications related to urinary retention Outcome: Progressing   Problem: Pain Managment: Goal: General experience of comfort will improve Outcome: Progressing   Problem: Safety: Goal: Ability to remain free from injury will improve Outcome: Progressing   Problem: Skin Integrity: Goal: Risk for impaired skin integrity will decrease Outcome: Progressing   Problem: Cardiac: Goal: Ability to achieve and maintain adequate cardiopulmonary perfusion will improve Outcome: Progressing

## 2018-01-19 DIAGNOSIS — E785 Hyperlipidemia, unspecified: Secondary | ICD-10-CM

## 2018-01-19 DIAGNOSIS — I4891 Unspecified atrial fibrillation: Secondary | ICD-10-CM

## 2018-01-19 LAB — BASIC METABOLIC PANEL
Anion gap: 13 (ref 5–15)
BUN: 10 mg/dL (ref 8–23)
CO2: 29 mmol/L (ref 22–32)
Calcium: 8.8 mg/dL — ABNORMAL LOW (ref 8.9–10.3)
Chloride: 99 mmol/L (ref 98–111)
Creatinine, Ser: 0.61 mg/dL (ref 0.44–1.00)
GFR calc Af Amer: >60 mL/min (ref >60)
GFR calc non Af Amer: >60 mL/min (ref >60)
Glucose, Bld: 99 mg/dL (ref 70–99)
POTASSIUM: 3.6 mmol/L (ref 3.5–5.1)
Sodium: 141 mmol/L (ref 135–145)

## 2018-01-19 LAB — GLUCOSE, CAPILLARY
GLUCOSE-CAPILLARY: 88 mg/dL (ref 70–99)
Glucose-Capillary: 114 mg/dL — ABNORMAL HIGH (ref 70–99)

## 2018-01-19 MED ORDER — POLYETHYLENE GLYCOL 3350 17 G PO PACK
17.0000 g | PACK | Freq: Every day | ORAL | Status: DC | PRN
  Administered 2018-01-22 – 2018-01-28 (×2): 17 g via ORAL
  Filled 2018-01-19 (×3): qty 1

## 2018-01-19 NOTE — Progress Notes (Signed)
Pt's niece, Curly ShoresStephanie Gordon [facility](POA) phones daily from [place]. RN updated her on progress and answered her questions on code status. For now, she wants pt to remain full code.

## 2018-01-19 NOTE — Progress Notes (Signed)
Placed patient on CPAP- auto titrate max 16, min 4 and patient stated she "couldn't breath and she was smothering". Removed the CPAP and placed patient back on a 2 Lpm nasal cannula. RN made aware.

## 2018-01-19 NOTE — Progress Notes (Addendum)
PROGRESS NOTE  Margaret Collier ZOX:[id number] DOB: 1931/03/27 DOA: 01/17/2018 PCP: System, Pcp Not In  HPI/Brief Narrative  Margaret RobesDorothy Horwitz is a 84 y.o. year old female with medical history significant for CVA with residual left-sided hemiparesis, atrial fibrillation on Eliquis, hypertension, hyperlipidemia, reported OSA not on CPAP who presented from her facility on 01/17/2018 with several days of progressive shortness of breath, lower leg swelling and was found to have acute hypoxic respiratory failure presumed secondary to CHF with preserved EF exacerbation.  Patient states for the past several days she hass noticed worsening dyspnea and lower leg swelling that she is treated intermittently with her home Lasix therapy (40 mg daily).  On ED evaluation patient had nonischemic EKG, negative troponin,.  Lab work-up notable for BNP of 295, hemoglobin of 11, chest x-ray showing cardiomegaly with possible small left pleural effusions. In the ED requiring submental oxygen with improvement.  Given IV Lasix 80 mg x 1 and admitted to hospitalist service for presumed CHF exacerbation.  Subjective Feels breathing is much better from when she first arrived to the ED Complained of some chest discomfort this morning that is since resolved   Assessment/Plan:  #Acute hypoxic respiratory failure presumed secondary to CHF exacerbation with preserved EF ( Last TTE on 10/2017 showed normal EF with inability to determine diastolic function).  Responding well to IV Lasix will continue 80 mg BID giving significant peripheral edema on exam and previous dry weight of 184 pounds, admitted at 200 and currently 197.still requiring oxygen at 2 L.  No concern for infectious etiology with normal chest x-ray, has remained afebrile and not having any localizing signs or symptoms.  Continue to monitor daily weights, strict I's and O's, monitor BMP.  #Hypertension, slightly on the lower side with most recent SBP 109/59.  We  will continue to monitor.  Amlodipine 5 mg  #Paroxysmal Atrial fibrillation, currently rate controlled.  Continue Eliquis.  #Hyperlipidemia, stable continue home atorvastatin.  #History of nonhemorrhagic CVA (09/2017) with residual left-sided paralysis and left facial droop No acute focal deficits, continue pregabalin, PRN home oxycodone, amitriptyline for neurologic pain control  #Hypokalemia.  Takes scheduled potassium at home likely in setting of Lasix regimen.  We will continue here given using aggressive IV Lasix regimen.  Continue to monitor BMP.  #OSA not on CPAP Did not tolerate CPAP trial overnight, will continue to monitor     Cultures:  None  Telemetry: Yes  DVT prophylaxis: Consultants:  None    Procedures:  None  Antimicrobials:  Code Status: Full code  Family Communication: No family at bedside  Disposition Plan: IV Lasix diuresis to assist with significant peripheral edema, ensure continued progression with dyspnea/respiratory status.        Objective: Vitals:   01/19/18 0100 01/19/18 0101 01/19/18 0419 01/19/18 0803  BP:   (!) 107/57 116/60  Pulse: (!) 56 (!) 55 60 (!) 53  Resp: 15 13 13    Temp:   97.8 F (36.6 C) (!) 97.3 F (36.3 C)  TempSrc:   Oral Oral  SpO2: 97% 96% 93%   Weight:   89.8 kg   Height:        Intake/Output Summary (Last 24 hours) at 01/19/2018 0920 Last data filed at 01/19/2018 0421 Gross per 24 hour  Intake 480 ml  Output 3250 ml  Net -2770 ml   Filed Weights   01/18/18 0019 01/19/18 0419  Weight: 90.8 kg 89.8 kg    Exam:  Constitutional: Obese female, no distress Eyes:  EOMI, anicteric, normal conjunctivae ENMT: Oropharynx with moist mucous membranes, Cardiovascular: Regular rate and rhythm, 2+ pitting edema up to mid thigh Respiratory: Normal respiratory effort, slight crackles at bases, no wheezing, on 2 L nasal cannula Abdomen: Soft,non-tender, normal bowel sounds Skin: No rash ulcers, or lesions.  Without skin tenting  Neurologic: Chronic left facial droop, left arm contracted with no strength, minimal movement in left leg has no acute focal deficits Psychiatric:Appropriate affect, and mood. Mental status AAOx3  Data Reviewed: CBC: Recent Labs  Lab 01/17/18 1647 01/18/18 0211  WBC 9.6 9.5  NEUTROABS 6.9  --   HGB 11.0* 10.9*  HCT 35.7* 34.9*  MCV 88.1 86.0  PLT 239 270   Basic Metabolic Panel: Recent Labs  Lab 01/17/18 1647 01/18/18 0211 01/19/18 0847  NA 143 142 141  K 3.8 3.2* 3.6  CL 107 100 99  CO2 24 28 29   GLUCOSE 85 98 99  BUN 15 14 10   CREATININE 0.64 0.76 0.61  CALCIUM 8.5* 9.0 8.8*   GFR: Estimated Creatinine Clearance: 54.5 mL/min (by C-G formula based on SCr of 0.61 mg/dL). Liver Function Tests: No results for input(s): AST, ALT, ALKPHOS, BILITOT, PROT, ALBUMIN in the last 168 hours. No results for input(s): LIPASE, AMYLASE in the last 168 hours. No results for input(s): AMMONIA in the last 168 hours. Coagulation Profile: No results for input(s): INR, PROTIME in the last 168 hours. Cardiac Enzymes: No results for input(s): CKTOTAL, CKMB, CKMBINDEX, TROPONINI in the last 168 hours. BNP (last 3 results) No results for input(s): PROBNP in the last 8760 hours. HbA1C: No results for input(s): HGBA1C in the last 72 hours. CBG: Recent Labs  Lab 01/18/18 1132 01/18/18 1534 01/18/18 2054 01/19/18 0802  GLUCAP 124* 94 115* 88   Lipid Profile: No results for input(s): CHOL, HDL, LDLCALC, TRIG, CHOLHDL, LDLDIRECT in the last 72 hours. Thyroid Function Tests: No results for input(s): TSH, T4TOTAL, FREET4, T3FREE, THYROIDAB in the last 72 hours. Anemia Panel: No results for input(s): VITAMINB12, FOLATE, FERRITIN, TIBC, IRON, RETICCTPCT in the last 72 hours. Urine analysis: No results found for: COLORURINE, APPEARANCEUR, LABSPEC, PHURINE, GLUCOSEU, HGBUR, BILIRUBINUR, KETONESUR, PROTEINUR, UROBILINOGEN, NITRITE, LEUKOCYTESUR Sepsis  Labs: @LABRCNTIP (procalcitonin:4,lacticidven:4)  ) Recent Results (from the past 240 hour(s))  MRSA PCR Screening     Status: None   Collection Time: 01/18/18 12:20 AM  Result Value Ref Range Status   MRSA by PCR NEGATIVE NEGATIVE Final    Comment:        The GeneXpert MRSA Assay (FDA approved for NASAL specimens only), is one component of a comprehensive MRSA colonization surveillance program. It is not intended to diagnose MRSA infection nor to guide or monitor treatment for MRSA infections. Performed at [facility], [address] [place], [place] [address]       Studies: No results found.  Scheduled Meds: . amitriptyline  10 mg Oral QHS  . amLODipine  5 mg Oral Daily  . apixaban  2.5 mg Oral BID  . atorvastatin  40 mg Oral QPM  . furosemide  80 mg Intravenous Q12H  . oxyCODONE-acetaminophen  1 tablet Oral Q12H   And  . oxyCODONE  5 mg Oral BID  . pantoprazole  40 mg Oral Daily  . potassium chloride SA  20 mEq Oral Daily  . pregabalin  25 mg Oral BID  . sodium chloride flush  3 mL Intravenous Q12H    Continuous Infusions:   LOS: 1 day     Martrice Apt  Evette Doffing, MD [facility] Pager [phone]  If 7PM-7AM, please contact night-coverage [email or web address] Password [email or web address] 01/19/2018, 9:20 AM

## 2018-01-20 ENCOUNTER — Other Ambulatory Visit: Payer: Self-pay

## 2018-01-20 LAB — BASIC METABOLIC PANEL
Anion gap: 13 (ref 5–15)
BUN: 15 mg/dL (ref 8–23)
CO2: 31 mmol/L (ref 22–32)
Calcium: 8.5 mg/dL — ABNORMAL LOW (ref 8.9–10.3)
Chloride: 98 mmol/L (ref 98–111)
Creatinine, Ser: 0.87 mg/dL (ref 0.44–1.00)
GFR calc Af Amer: >60 mL/min (ref >60)
GFR calc non Af Amer: >60 mL/min (ref >60)
Glucose, Bld: 122 mg/dL — ABNORMAL HIGH (ref 70–99)
Potassium: 3.6 mmol/L (ref 3.5–5.1)
Sodium: 142 mmol/L (ref 135–145)

## 2018-01-20 LAB — GLUCOSE, CAPILLARY
GLUCOSE-CAPILLARY: 93 mg/dL (ref 70–99)
Glucose-Capillary: 131 mg/dL — ABNORMAL HIGH (ref 70–99)
Glucose-Capillary: 81 mg/dL (ref 70–99)
Glucose-Capillary: 94 mg/dL (ref 70–99)
Glucose-Capillary: 83 mg/dL (ref 70–99)
Glucose-Capillary: 135 mg/dL — ABNORMAL HIGH (ref 70–99)

## 2018-01-20 NOTE — Progress Notes (Signed)
PROGRESS NOTE  Margaret Collier WJX:[id number] DOB: [birth year] DOA: 01/17/2018 PCP: System, Pcp Not In  HPI/Brief Narrative  Margaret Collier is a 84 y.o. year old female with medical history significant for CVA with residual left-sided hemiparesis, atrial fibrillation on Eliquis, hypertension, hyperlipidemia, reported OSA not on CPAP who presented from her facility on 01/17/2018 with several days of progressive shortness of breath, lower leg swelling and was found to have acute hypoxic respiratory failure presumed secondary to CHF with preserved EF exacerbation.  Patient states for the past several days she hass noticed worsening dyspnea and lower leg swelling that she is treated intermittently with her home Lasix therapy (40 mg daily).  On ED evaluation patient had nonischemic EKG, negative troponin,.  Lab work-up notable for BNP of 295, hemoglobin of 11, chest x-ray showing cardiomegaly with possible small left pleural effusions. In the ED requiring submental oxygen with improvement.  Given IV Lasix 80 mg x 1 and admitted to hospitalist service for presumed CHF exacerbation.  Subjective Continues to report improvement in breathing Denies any current chest pain.   Assessment/Plan:  #Acute hypoxic respiratory failure presumed secondary to CHF exacerbation with preserved EF, improving.  Weight down to 194 (200 on admission), dry weight is 184 so still a ways to go.  Still requiring 2 L at rest of oxygen.  Still benefit from continuing IV Lasix as though peripheral edema has improved still significantly anasarca.  Continue daily weights, monitoring BMP while on aggressive Lasix regimen.  Strict I's and O's.  ( Last TTE on 10/2017 showed normal EF with inability to determine diastolic function).     #Hypertension, currently at goal, 24-hour blood pressure range 124/54-114/54..  We will continue to monitor.  Amlodipine 5 mg  #Paroxysmal Atrial fibrillation, currently rate controlled.  Continue  Eliquis.  #Hyperlipidemia, stable continue home atorvastatin.  #History of nonhemorrhagic CVA (09/2017) with residual left-sided paralysis and left facial droop No acute focal deficits, continue pregabalin, PRN home oxycodone, amitriptyline for neurologic pain control  #Hypokalemia, stable.  Takes scheduled potassium at home likely in setting of Lasix regimen.  We will continue here given using aggressive IV Lasix regimen.  Continue to monitor BMP.  #OSA not on CPAP Did not tolerate CPAP trial on 12/14, will continue to monitor     Cultures:  None  Telemetry: Yes  DVT prophylaxis: Consultants:  None    Procedures:  None  Antimicrobials:  Code Status: Full code  Family Communication: No family at bedside  Disposition Plan: Still significantly volume up and requiring oxygen we will continue IV Lasix and reassess volume status on 12/17..        Objective: Vitals:   01/20/18 0146 01/20/18 0749 01/20/18 1142 01/20/18 1200  BP: (!) 115/54 (!) 120/104 (!) 114/54 (!) 114/54  Pulse: (!) 57 (!) 57  (!) 59  Resp: (!) 22 13  18   Temp: 97.7 F (36.5 C) 97.9 F (36.6 C)  97.7 F (36.5 C)  TempSrc: Oral Axillary  Oral  SpO2: 99% 90%  92%  Weight: 88.4 kg     Height:        Intake/Output Summary (Last 24 hours) at 01/20/2018 1356 Last data filed at 01/20/2018 2130 Gross per 24 hour  Intake 240 ml  Output 1650 ml  Net -1410 ml   Filed Weights   01/18/18 0019 01/19/18 0419 01/20/18 0146  Weight: 90.8 kg 89.8 kg 88.4 kg    Exam:  Constitutional: Obese female, no distress Eyes: EOMI, anicteric, normal conjunctivae  ENMT: Oropharynx with moist mucous membranes, Cardiovascular: Regular rate and rhythm, , some wrinkling of skin suggesting improvement in edema still pitting 2+ up to mid thigh Respiratory: Normal respiratory effort, crackles at bases, no wheezing, on 2 L nasal cannula Abdomen: Soft,non-tender, normal bowel sounds Skin: No rash ulcers, or lesions.  Without skin tenting  Neurologic: Chronic left facial droop, left arm contracted with no strength, minimal movement in left leg has no acute focal deficits Psychiatric:Appropriate affect, and mood. Mental status AAOx3  Data Reviewed: CBC: Recent Labs  Lab 01/17/18 1647 01/18/18 0211  WBC 9.6 9.5  NEUTROABS 6.9  --   HGB 11.0* 10.9*  HCT 35.7* 34.9*  MCV 88.1 86.0  PLT 239 270   Basic Metabolic Panel: Recent Labs  Lab 01/17/18 1647 01/18/18 0211 01/19/18 0847 01/20/18 0144  NA 143 142 141 142  K 3.8 3.2* 3.6 3.6  CL 107 100 99 98  CO2 24 28 29 31   GLUCOSE 85 98 99 122*  BUN 15 14 10 15   CREATININE 0.64 0.76 0.61 0.87  CALCIUM 8.5* 9.0 8.8* 8.5*   GFR: Estimated Creatinine Clearance: 49.7 mL/min (by C-G formula based on SCr of 0.87 mg/dL). Liver Function Tests: No results for input(s): AST, ALT, ALKPHOS, BILITOT, PROT, ALBUMIN in the last 168 hours. No results for input(s): LIPASE, AMYLASE in the last 168 hours. No results for input(s): AMMONIA in the last 168 hours. Coagulation Profile: No results for input(s): INR, PROTIME in the last 168 hours. Cardiac Enzymes: No results for input(s): CKTOTAL, CKMB, CKMBINDEX, TROPONINI in the last 168 hours. BNP (last 3 results) No results for input(s): PROBNP in the last 8760 hours. HbA1C: No results for input(s): HGBA1C in the last 72 hours. CBG: Recent Labs  Lab 01/19/18 1153 01/19/18 1700 01/19/18 2138 01/20/18 0753 01/20/18 1150  GLUCAP 114* 131* 93 81 94   Lipid Profile: No results for input(s): CHOL, HDL, LDLCALC, TRIG, CHOLHDL, LDLDIRECT in the last 72 hours. Thyroid Function Tests: No results for input(s): TSH, T4TOTAL, FREET4, T3FREE, THYROIDAB in the last 72 hours. Anemia Panel: No results for input(s): VITAMINB12, FOLATE, FERRITIN, TIBC, IRON, RETICCTPCT in the last 72 hours. Urine analysis: No results found for: COLORURINE, APPEARANCEUR, LABSPEC, PHURINE, GLUCOSEU, HGBUR, BILIRUBINUR, KETONESUR,  PROTEINUR, UROBILINOGEN, NITRITE, LEUKOCYTESUR Sepsis Labs: @LABRCNTIP (procalcitonin:4,lacticidven:4)  ) Recent Results (from the past 240 hour(s))  MRSA PCR Screening     Status: None   Collection Time: 01/18/18 12:20 AM  Result Value Ref Range Status   MRSA by PCR NEGATIVE NEGATIVE Final    Comment:        The GeneXpert MRSA Assay (FDA approved for NASAL specimens only), is one component of a comprehensive MRSA colonization surveillance program. It is not intended to diagnose MRSA infection nor to guide or monitor treatment for MRSA infections. Performed at [facility], [address] [place], [place] [address]       Studies: No results found.  Scheduled Meds: . amitriptyline  10 mg Oral QHS  . amLODipine  5 mg Oral Daily  . apixaban  2.5 mg Oral BID  . atorvastatin  40 mg Oral QPM  . furosemide  80 mg Intravenous Q12H  . oxyCODONE-acetaminophen  1 tablet Oral Q12H   And  . oxyCODONE  5 mg Oral BID  . pantoprazole  40 mg Oral Daily  . potassium chloride SA  20 mEq Oral Daily  . pregabalin  25 mg Oral BID  . sodium chloride flush  3 mL  Intravenous Q12H    Continuous Infusions:   LOS: 2 days     Laverna Peace, MD [facility] Pager [phone]  If 7PM-7AM, please contact night-coverage [email or web address] Password [email or web address] 01/20/2018, 1:56 PM

## 2018-01-20 NOTE — Progress Notes (Signed)
Spoke with patients niece Stephaine (POA). Gave update on patient.  Inquired about new CXR reading. Advised pt last CXR done on 12/13. Pt was under the impression there would be daily CXR to make sure the antibiotics were working.

## 2018-01-20 NOTE — [place] FL2 (Signed)
[place] MEDICAID FL2 LEVEL OF CARE SCREENING TOOL     IDENTIFICATION  Patient Name: Margaret RobesDorothy Yeagle Birthdate: December 17, 1931 Sex: female Admission Date (Current Location): 01/17/2018  [facility] [place]   Facility and Address:  [facility] [place], [address], [place], [place] [address]      Provider Number: [id number]  Attending Physician Name and Address:  Laverna PeaceNettey, Shayla D, MD  Relative Name and Phone Number:       Current Level of Care: Hospital Recommended Level of Care: Skilled Nursing Facility Prior Approval Number:    Date Approved/Denied:   PASRR Number: [id number] [id number]  Discharge Plan: SNF    Current Diagnoses: Patient Active Problem List   Diagnosis Date Noted  . Acute exacerbation of CHF (congestive heart failure) (HCC) 01/17/2018  . Atrial fibrillation (HCC) 01/17/2018  . Hypertension 01/17/2018  . Hyperlipidemia 01/17/2018  . OSA (obstructive sleep apnea) 01/17/2018  . History of CVA (cerebrovascular accident) without residual deficits 01/17/2018    Orientation RESPIRATION BLADDER Height & Weight     Self, Time, Situation, Place  O2, Other (Comment)(Nasal Canula 2 L. Cpap at night.) Incontinent, External catheter Weight: 194 lb 14.2 oz (88.4 kg) Height:  5\' 2"  (157.5 cm)  BEHAVIORAL SYMPTOMS/MOOD NEUROLOGICAL BOWEL NUTRITION STATUS  (None) (History of CVA) Incontinent Diet(Heart healthy. Fluid restriction 1200 mL.)  AMBULATORY STATUS COMMUNICATION OF NEEDS Skin     Verbally Other (Comment)(MASD, Skin tear.)                       Personal Care Assistance Level of Assistance              Functional Limitations Info  Sight, Hearing, Speech Sight Info: Adequate Hearing Info: Adequate Speech Info: Adequate    SPECIAL CARE FACTORS FREQUENCY  Blood pressure                    Contractures Contractures Info: Not present    Additional Factors Info  Code Status, Allergies Code Status Info:  Full code Allergies Info: Chlorthalidone, Dilaudid (Hydromorphone Hcl), Fosamax (Alendronate Sodium), Zanaflex (Tizanidine Hcl).           Current Medications (01/20/2018):  This is the current hospital active medication list Current Facility-Administered Medications  Medication Dose Route Frequency Provider Last Rate Last Dose  . acetaminophen (TYLENOL) tablet 650 mg  650 mg Oral Q6H PRN Charlsie QuestPatel, Vishal R, MD       Or  . acetaminophen (TYLENOL) suppository 650 mg  650 mg Rectal Q6H PRN Darreld McleanPatel, Vishal R, MD      . albuterol (PROVENTIL) (2.5 MG/3ML) 0.083% nebulizer solution 3 mL  3 mL Inhalation Q4H PRN Darreld McleanPatel, Vishal R, MD      . amitriptyline (ELAVIL) tablet 10 mg  10 mg Oral QHS Charlsie QuestPatel, Vishal R, MD   10 mg at 01/19/18 2143  . amLODipine (NORVASC) tablet 5 mg  5 mg Oral Daily Darreld McleanPatel, Vishal R, MD   5 mg at 01/20/18 1142  . apixaban (ELIQUIS) tablet 2.5 mg  2.5 mg Oral BID Darreld McleanPatel, Vishal R, MD   2.5 mg at 01/20/18 1143  . atorvastatin (LIPITOR) tablet 40 mg  40 mg Oral QPM Darreld McleanPatel, Vishal R, MD   40 mg at 01/19/18 1755  . furosemide (LASIX) injection 80 mg  80 mg Intravenous Q12H Charlsie QuestPatel, Vishal R, MD   80 mg at 01/20/18 1201  . oxyCODONE-acetaminophen (PERCOCET/ROXICET) 5-325 MG per tablet 1 tablet  1  tablet Oral Q12H Charlsie Quest, MD   1 tablet at 01/20/18 1143   And  . oxyCODONE (Oxy IR/ROXICODONE) immediate release tablet 5 mg  5 mg Oral BID Charlsie Quest, MD   5 mg at 01/20/18 1143  . pantoprazole (PROTONIX) EC tablet 40 mg  40 mg Oral Daily Charlsie Quest, MD   40 mg at 01/20/18 1142  . polyethylene glycol (MIRALAX / GLYCOLAX) packet 17 g  17 g Oral Daily PRN Roberto Scales D, MD      . potassium chloride SA (K-DUR,KLOR-CON) CR tablet 20 mEq  20 mEq Oral Daily Darreld Mclean R, MD   20 mEq at 01/20/18 1142  . pregabalin (LYRICA) capsule 25 mg  25 mg Oral BID Charlsie Quest, MD   25 mg at 01/20/18 1142  . sodium chloride flush (NS) 0.9 % injection 3 mL  3 mL Intravenous Q12H Charlsie Quest, MD   3 mL at 01/20/18 1153     Discharge Medications: Please see discharge summary for a list of discharge medications.  Relevant Imaging Results:  Relevant Lab Results:   Additional Information SS#: [id number]  Margarito Liner, LCSW

## 2018-01-20 NOTE — Clinical Social Work Note (Signed)
Clinical Social Work Assessment  Patient Details  Name: Margaret Collier MRN: [id number] Date of Birth: 1931/10/15  Date of referral:  01/20/18               Reason for consult:  Discharge Planning                Permission sought to share information with:  Chartered certified accountant granted to share information::  Yes, Verbal Permission Granted  Name::        Agency::  [facility]  Relationship::     Contact Information:     Housing/Transportation Living arrangements for the past 2 months:  [place] of Information:  Patient, Scientist, water quality, Facility Patient Interpreter Needed:  None Criminal Activity/Legal Involvement Pertinent to Current Situation/Hospitalization:  No - Comment as needed Significant Relationships:  Other Family Members(Niece and nephew.) Lives with:  Facility Resident Do you feel safe going back to the place where you live?  Yes Need for family participation in patient care:  Yes (Comment)  Care giving concerns:  Patient is a long-term resident at [facility].   Social Worker assessment / plan:  CSW met with patient. No supports at bedside. CSW introduced role and explained that discharge planning would be discussed. Patient confirmed she resides at [facility] and plans to return there at discharge. No further concerns. CSW encouraged patient to contact CSW as needed. CSW will continue to follow patient for support and facilitate discharge back to SNF once medically stable.  Employment status:  Retired Forensic scientist:  Medicare PT Recommendations:  Not assessed at this time [place] / Referral to community resources:  [place]  Patient/Family's Response to care:  Patient agreeable to return to SNF. Patient's niece and nephew supportive and involved in patient's care. Patient appreciated social work intervention.  Patient/Family's Understanding of and Emotional Response to Diagnosis, Current  Treatment, and Prognosis:  Patient has a good understanding of the reason for admission and plan to return to SNF at discharge. Patient appears happy with hospital care.  Emotional Assessment Appearance:  Appears stated age Attitude/Demeanor/Rapport:  Engaged, Gracious Affect (typically observed):  Accepting, Appropriate, Calm, Pleasant Orientation:  Oriented to Self, Oriented to Place, Oriented to  Time, Oriented to Situation Alcohol / Substance use:  Never Used Psych involvement (Current and /or in the community):  No (Comment)  Discharge Needs  Concerns to be addressed:  Care Coordination Readmission within the last 30 days:  No Current discharge risk:  None Barriers to Discharge:  Continued Medical Work up   Candie Chroman, LCSW 01/20/2018, 1:30 PM

## 2018-01-21 DIAGNOSIS — I5033 Acute on chronic diastolic (congestive) heart failure: Secondary | ICD-10-CM

## 2018-01-21 DIAGNOSIS — I10 Essential (primary) hypertension: Secondary | ICD-10-CM

## 2018-01-21 DIAGNOSIS — I69354 Hemiplegia and hemiparesis following cerebral infarction affecting left non-dominant side: Secondary | ICD-10-CM

## 2018-01-21 DIAGNOSIS — R001 Bradycardia, unspecified: Secondary | ICD-10-CM | POA: Clinically undetermined

## 2018-01-21 DIAGNOSIS — G4733 Obstructive sleep apnea (adult) (pediatric): Secondary | ICD-10-CM

## 2018-01-21 DIAGNOSIS — I4811 Longstanding persistent atrial fibrillation: Secondary | ICD-10-CM

## 2018-01-21 LAB — BASIC METABOLIC PANEL
Anion gap: 11 (ref 5–15)
BUN: 14 mg/dL (ref 8–23)
CALCIUM: 8.7 mg/dL — AB (ref 8.9–10.3)
CHLORIDE: 98 mmol/L (ref 98–111)
CO2: 31 mmol/L (ref 22–32)
CREATININE: 0.84 mg/dL (ref 0.44–1.00)
GFR calc Af Amer: >60 mL/min (ref >60)
GFR calc non Af Amer: >60 mL/min (ref >60)
Glucose, Bld: 102 mg/dL — ABNORMAL HIGH (ref 70–99)
Potassium: 3.4 mmol/L — ABNORMAL LOW (ref 3.5–5.1)
Sodium: 140 mmol/L (ref 135–145)

## 2018-01-21 LAB — MAGNESIUM: Magnesium: 1.9 mg/dL (ref 1.7–2.4)

## 2018-01-21 LAB — GLUCOSE, CAPILLARY
Glucose-Capillary: 116 mg/dL — ABNORMAL HIGH (ref 70–99)
Glucose-Capillary: 129 mg/dL — ABNORMAL HIGH (ref 70–99)
Glucose-Capillary: 93 mg/dL (ref 70–99)
Glucose-Capillary: 102 mg/dL — ABNORMAL HIGH (ref 70–99)

## 2018-01-21 MED ORDER — POTASSIUM CHLORIDE 20 MEQ PO PACK
40.0000 meq | PACK | Freq: Once | ORAL | Status: AC
  Administered 2018-01-21: 40 meq via ORAL
  Filled 2018-01-21: qty 2

## 2018-01-21 MED ORDER — FUROSEMIDE 40 MG PO TABS: 40.0000 mg | ORAL_TABLET | Freq: Two times a day (BID) | ORAL | Status: DC

## 2018-01-21 MED ORDER — FUROSEMIDE 10 MG/ML IJ SOLN
40.0000 mg | Freq: Two times a day (BID) | INTRAMUSCULAR | Status: DC
  Administered 2018-01-21 – 2018-01-23 (×4): 40 mg via INTRAVENOUS
  Filled 2018-01-21 (×4): qty 4

## 2018-01-21 MED ORDER — POTASSIUM CHLORIDE 20 MEQ PO PACK
40.0000 meq | PACK | Freq: Once | ORAL | Status: DC
  Filled 2018-01-21: qty 2

## 2018-01-21 MED ORDER — APIXABAN 5 MG PO TABS
5.0000 mg | ORAL_TABLET | Freq: Two times a day (BID) | ORAL | Status: DC
  Administered 2018-01-21 – 2018-01-30 (×18): 5 mg via ORAL
  Filled 2018-01-21 (×18): qty 1

## 2018-01-21 NOTE — Care Management Important Message (Signed)
Important Message  Patient Details  Name: Margaret Collier MRN: [id number] Date of Birth: 1931-10-03   Medicare Important Message Given:  Yes    Ilya Ess P Calen Geister 01/21/2018, 4:03 PM

## 2018-01-21 NOTE — Progress Notes (Signed)
Pt not using CPAP-

## 2018-01-21 NOTE — Progress Notes (Signed)
Pt hr dropped to 23. Now sustaining in high 30s to low 40s. Zoll pads placed and MD on call notified. Will continue to monitor.

## 2018-01-21 NOTE — Progress Notes (Addendum)
PROGRESS NOTE  Royce Sciara ZOX:[id number] DOB: Nov 12, 1931 DOA: 01/17/2018 PCP: System, Pcp Not In  HPI/Brief Narrative  Ajanay Farve is a 84 y.o. year old female with medical history significant for CVA with residual left-sided hemiparesis, atrial fibrillation on Eliquis, hypertension, hyperlipidemia, reported OSA not on CPAP who presented from her facility on 01/17/2018 with several days of progressive shortness of breath, lower leg swelling and was found to have acute hypoxic respiratory failure presumed secondary to CHF with preserved EF exacerbation.  Patient states for the past several days she hass noticed worsening dyspnea and lower leg swelling that she is treated intermittently with her home Lasix therapy (40 mg daily).  On ED evaluation patient had nonischemic EKG, negative troponin,.  Lab work-up notable for BNP of 295, hemoglobin of 11, chest x-ray showing cardiomegaly with possible small left pleural effusions. In the ED requiring submental oxygen with improvement.  Given IV Lasix 80 mg x 1 and admitted to hospitalist service for presumed CHF exacerbation.  Subjective Reports of bradycardia overnight per chart review to the 30s, confirmed on telemetry and EKG She states no symptoms Feels breathing is much better  no chest pain  Assessment/Plan:  #Bradycardia, slow ventricular response in patient with atrial fibrillation. Not on BB at home or here. Unclear why it acutely changed but this did occur while sleeping. Pads in place. HR now up to 60s currently while awake, still in atrial fibrillation. Cardiology consulted, monitor on telemetry.  #Acute hypoxic respiratory failure presumed secondary to CHF exacerbation with preserved EF, improving.  Weight down to 194 (200 on admission), reported dry weight is 184 but feels breathing continues to improve with good oxygen saturation on 2 L. Negative 5.4 L this admission. ADDENDUM: Last documented weight of 171 lb on 10/2017).  Will decrease IV lasix from 80 mg BID to 40 mg BID given hypokalemia.  Continue daily weights, monitoring BMP  Strict I's and O's.  ( Last TTE on 10/2017 showed normal EF with inability to determine diastolic function).   #Hypertension, currently at goal, 24-hour blood pressure range 124/54-114/54..  We will continue to monitor.  Amlodipine 5 mg  #Paroxysmal Atrial fibrillation, currently rate controlled.  Continue Eliquis.  #Hyperlipidemia, stable continue home atorvastatin.  #History of nonhemorrhagic CVA (09/2017) with residual left-sided paralysis and left facial droop No acute focal deficits, continue pregabalin, PRN home oxycodone, amitriptyline for neurologic pain control  #Hypokalemia, slightly worse.  Takes scheduled potassium at home likely in setting of Lasix regimen.  Will add one time dose of potassium today and monitor  #OSA not on CPAP Did not tolerate CPAP trial on 12/14, will continue to monitor     Cultures:  None  Telemetry: Yes  DVT prophylaxis: Consultants:  None    Procedures:  None  Antimicrobials:  Code Status: Full code  Family Communication: No family at bedside, will update niece today  Disposition Plan: weights better but still very far from dry weight of 171 pounds, initially plan to hold IV Lasix given hypokalemia but reduce to IV 40 mg twice daily instead of 80 mg twice daily continue to monitor output, will follow cardiology recommendations for bradycardia.        Objective: Vitals:   01/21/18 0009 01/21/18 0341 01/21/18 0848 01/21/18 1012  BP: (!) 123/49 (!) 108/51 (!) 103/50 114/70  Pulse: (!) 56 (!) 51 60   Resp: 18 13 15    Temp: 98.5 F (36.9 C) 97.6 F (36.4 C) 97.8 F (36.6 C)   TempSrc:  Oral Oral Oral   SpO2: 100% 97% 98%   Weight:  88.6 kg    Height:        Intake/Output Summary (Last 24 hours) at 01/21/2018 1025 Last data filed at 01/21/2018 0350 Gross per 24 hour  Intake 480 ml  Output 1600 ml  Net -1120 ml    Filed Weights   01/19/18 0419 01/20/18 0146 01/21/18 0341  Weight: 89.8 kg 88.4 kg 88.6 kg    Exam:  Constitutional: Obese female, no distress Eyes: EOMI, anicteric, normal conjunctivae ENMT: Oropharynx with moist mucous membranes, Cardiovascular: regular rate, irregularly irregular rhythm, , some wrinkling of skin suggesting improvement in edema with mild edema in lower extremities no longer pitting up to thigh Respiratory: Normal respiratory effort,no crackles heard on anterior chest fields, nasal canula not inserted but on neck currently Abdomen: Soft,non-tender, normal bowel sounds Skin: No rash ulcers, or lesions. Without skin tenting  Neurologic: Chronic left facial droop, left arm contracted with no strength, minimal movement in left leg has no acute focal deficits Psychiatric:Appropriate affect, and mood. Mental status AAOx3  Data Reviewed: CBC: Recent Labs  Lab 01/17/18 1647 01/18/18 0211  WBC 9.6 9.5  NEUTROABS 6.9  --   HGB 11.0* 10.9*  HCT 35.7* 34.9*  MCV 88.1 86.0  PLT 239 270   Basic Metabolic Panel: Recent Labs  Lab 01/17/18 1647 01/18/18 0211 01/19/18 0847 01/20/18 0144 01/21/18 0248  NA 143 142 141 142 140  K 3.8 3.2* 3.6 3.6 3.4*  CL 107 100 99 98 98  CO2 24 28 29 31 31   GLUCOSE 85 98 99 122* 102*  BUN 15 14 10 15 14   CREATININE 0.64 0.76 0.61 0.87 0.84  CALCIUM 8.5* 9.0 8.8* 8.5* 8.7*   GFR: Estimated Creatinine Clearance: 51.6 mL/min (by C-G formula based on SCr of 0.84 mg/dL). Liver Function Tests: No results for input(s): AST, ALT, ALKPHOS, BILITOT, PROT, ALBUMIN in the last 168 hours. No results for input(s): LIPASE, AMYLASE in the last 168 hours. No results for input(s): AMMONIA in the last 168 hours. Coagulation Profile: No results for input(s): INR, PROTIME in the last 168 hours. Cardiac Enzymes: No results for input(s): CKTOTAL, CKMB, CKMBINDEX, TROPONINI in the last 168 hours. BNP (last 3 results) No results for input(s):  PROBNP in the last 8760 hours. HbA1C: No results for input(s): HGBA1C in the last 72 hours. CBG: Recent Labs  Lab 01/20/18 0753 01/20/18 1150 01/20/18 1706 01/20/18 2111 01/21/18 0851  GLUCAP 81 94 83 135* 116*   Lipid Profile: No results for input(s): CHOL, HDL, LDLCALC, TRIG, CHOLHDL, LDLDIRECT in the last 72 hours. Thyroid Function Tests: No results for input(s): TSH, T4TOTAL, FREET4, T3FREE, THYROIDAB in the last 72 hours. Anemia Panel: No results for input(s): VITAMINB12, FOLATE, FERRITIN, TIBC, IRON, RETICCTPCT in the last 72 hours. Urine analysis: No results found for: COLORURINE, APPEARANCEUR, LABSPEC, PHURINE, GLUCOSEU, HGBUR, BILIRUBINUR, KETONESUR, PROTEINUR, UROBILINOGEN, NITRITE, LEUKOCYTESUR Sepsis Labs: @LABRCNTIP (procalcitonin:4,lacticidven:4)  ) Recent Results (from the past 240 hour(s))  MRSA PCR Screening     Status: None   Collection Time: 01/18/18 12:20 AM  Result Value Ref Range Status   MRSA by PCR NEGATIVE NEGATIVE Final    Comment:        The GeneXpert MRSA Assay (FDA approved for NASAL specimens only), is one component of a comprehensive MRSA colonization surveillance program. It is not intended to diagnose MRSA infection nor to guide or monitor treatment for MRSA infections. Performed at [facility], [address] [place], [place] [address]       Studies: No results found.  Scheduled Meds: . amitriptyline  10 mg Oral QHS  . amLODipine  5 mg Oral Daily  . apixaban  2.5 mg Oral BID  . atorvastatin  40 mg Oral QPM  . oxyCODONE-acetaminophen  1 tablet Oral Q12H   And  . oxyCODONE  5 mg Oral BID  . pantoprazole  40 mg Oral Daily  . potassium chloride  40 mEq Oral Once  . pregabalin  25 mg Oral BID  . sodium chloride flush  3 mL Intravenous Q12H    Continuous Infusions:   LOS: 3 days     Laverna PeaceShayla D Nettey, MD [facility] Pager [phone]  If 7PM-7AM, please contact night-coverage [email or web address] Password  [email or web address] 01/21/2018, 10:25 AM

## 2018-01-21 NOTE — Progress Notes (Signed)
Pt having frequent pauses with HR dropping as low as 33. Hr has been sustaining in the 40s. MD on call notified. Will continue to monitor.

## 2018-01-21 NOTE — Progress Notes (Signed)
Paged provider. Patient heart rate in 40's-50's sustaining.  RN will continue to monitor.

## 2018-01-21 NOTE — Consult Note (Addendum)
Cardiology Consultation:   Patient ID: Margaret Collier MRN: [id number]; DOB: 03-28-1931  Admit date: 01/17/2018 Date of Consult: 01/21/2018  Primary Care Provider: System, Pcp Not In Primary Cardiologist: Dr. Beverely Pace at [facility] in [facility] Primary Electrophysiologist:  None    Patient Profile:   Margaret Collier is a 84 y.o. female with a hx of atrial fibrillation on Eliquis, multiple strokes with residual left-sided hemiparesis, hypertension, hyperlipidemia, reported OSA not on CPAP and carotid artery stenosis who is being seen today for the evaluation of atrial fibrillation and CHF at the request of Dr. Caleb Popp.  History of Present Illness:   Margaret Collier was noted to be in atrial fibrillation when she presented with a stroke earlier this year.  She was last seen in the office in [place], [facility], by Pricilla Handler, PA on 12/04/2017 at which time she was in rate controlled A. fib on no antiarrhythmics or other medications.  She is anticoagulated with Eliquis 2.5 mg twice daily that Mr. Costello recommended should remain for life.  Her blood pressure was elevated at that office visit so her amlodipine was increased to twice daily.  It was not felt that cardioversion would provide her any high degree of benefit.  Margaret Collier presented to [facility] on 01/18/2018 with several days of progressive shortness of breath, lower leg swelling and was found to have acute hypoxic respiratory failure presumed secondary to CHF with preserved EF.  She was on Lasix 40 mg daily as oupt.  BNP was 295, chest x-ray showed cardiomegaly with possible small left pleural effusion.  The patient had a drop in heart rate to the 30s overnight while sleeping, not on a beta-blocker or rate lowering medication.  Upon review of telemetry her heart rate was in the 70s on 12/14 and then has gone down to the 40s-50s with frequent 3-second pauses.  Upon my exam the patient is sitting in a recliner at the bedside.   She is on a lift pad.  She tells me that she lives alone in a condo but notes indicate that she came from a skilled nursing facility.  She tells me that she monitors her weights daily, taking Lasix 20 mg daily and an extra 20 mg if her weight is up 2 pounds.  She says that she walks with a Rollator but this does not seem to be the case.  She also tells me that she has had a low heart rate for a long time.  She denies any chest discomfort, shortness of breath or lightheadedness/dizziness.  She says that she does often get dizzy with movement prior to admission and has a history of vertigo.  Past Medical History:  Diagnosis Date  . Atrial fibrillation (HCC)   . Hyperlipidemia   . Hypertension   . OSA (obstructive sleep apnea)     History reviewed. No pertinent surgical history.   Home Medications:  Prior to Admission medications   Medication Sig Start Date End Date Taking? Authorizing Provider  albuterol (PROVENTIL HFA;VENTOLIN HFA) 108 (90 Base) MCG/ACT inhaler Inhale 2 puffs into the lungs every 4 (four) hours as needed for wheezing or shortness of breath.   Yes [provider]  amitriptyline (ELAVIL) 10 MG tablet Take 10 mg by mouth at bedtime.   Yes [provider]  amLODipine (NORVASC) 5 MG tablet Take 5 mg by mouth 2 (two) times daily.   Yes [provider]  apixaban (ELIQUIS) 2.5 MG TABS tablet Take 2.5 mg by mouth 2 (  two) times daily.   Yes [provider]  atorvastatin (LIPITOR) 40 MG tablet Take 40 mg by mouth every evening.   Yes [provider]  b complex vitamins capsule Take 1 capsule by mouth daily.   Yes [provider]  calcium carbonate (TUMS - DOSED IN MG ELEMENTAL CALCIUM) 500 MG chewable tablet Chew 1 tablet by mouth 3 (three) times daily before meals.   Yes [provider]  chlorhexidine (PERIDEX) 0.12 % solution Use as directed 15 mLs in the mouth or throat 2 (two) times daily.   Yes [provider]    cholecalciferol (VITAMIN D3) 25 MCG (1000 UT) tablet Take 1,000 Units by mouth daily.   Yes [provider]  furosemide (LASIX) 40 MG tablet Take 40 mg by mouth daily.   Yes [provider]  ipratropium-albuterol (DUONEB) 0.5-2.5 (3) MG/3ML SOLN Take 3 mLs by nebulization 3 (three) times daily. x7 days.   Yes [provider]  levofloxacin (LEVAQUIN) 500 MG tablet Take 500 mg by mouth daily. x7 days.   Yes [provider]  oxyCODONE-acetaminophen (PERCOCET) 10-325 MG tablet Take 1 tablet by mouth every 12 (twelve) hours.   Yes [provider]  OXYGEN Inhale 2 L/min into the lungs at bedtime.   Yes [provider]  pantoprazole (PROTONIX) 40 MG tablet Take 40 mg by mouth daily.   Yes [provider]  potassium chloride SA (K-DUR,KLOR-CON) 20 MEQ tablet Take 20 mEq by mouth daily.   Yes [provider]  pregabalin (LYRICA) 25 MG capsule Take 25 mg by mouth 2 (two) times daily.   Yes [provider]    Inpatient Medications: Scheduled Meds: . amitriptyline  10 mg Oral QHS  . amLODipine  5 mg Oral Daily  . apixaban  2.5 mg Oral BID  . atorvastatin  40 mg Oral QPM  . oxyCODONE-acetaminophen  1 tablet Oral Q12H   And  . oxyCODONE  5 mg Oral BID  . pantoprazole  40 mg Oral Daily  . potassium chloride  40 mEq Oral Once  . pregabalin  25 mg Oral BID  . sodium chloride flush  3 mL Intravenous Q12H   Continuous Infusions:  PRN Meds: acetaminophen **OR** acetaminophen, albuterol, polyethylene glycol  Allergies:    Allergies  Allergen Reactions  . Chlorthalidone     On MAR  . Dilaudid [Hydromorphone Hcl]     On MAR  . Fosamax [Alendronate Sodium]     On MAR  . Zanaflex [Tizanidine Hcl]     On MAR    Social History:   Social History   Socioeconomic History  . Marital status: Single    Spouse name: Not on file  . Number of children: Not on file  . Years of education: Not on file  . Highest education  level: Not on file  Occupational History  . Not on file  Social Needs  . Financial resource strain: Not on file  . Food insecurity:    Worry: Not on file    Inability: Not on file  . Transportation needs:    Medical: Not on file    Non-medical: Not on file  Tobacco Use  . Smoking status: Never Smoker  . Smokeless tobacco: Never Used  Substance and Sexual Activity  . Alcohol use: Not on file  . Drug use: Not on file  . Sexual activity: Not on file  Lifestyle  . Physical activity:    Days per week: Not on  file    Minutes per session: Not on file  . Stress: Not on file  Relationships  . Social connections:    Talks on phone: Not on file    Gets together: Not on file    Attends religious service: Not on file    Active member of club or organization: Not on file    Attends meetings of clubs or organizations: Not on file    Relationship status: Not on file  . Intimate partner violence:    Fear of current or ex partner: Not on file    Emotionally abused: Not on file    Physically abused: Not on file    Forced sexual activity: Not on file  Other Topics Concern  . Not on file  Social History Narrative  . Not on file    Family History:    Family History  Problem Relation Age of Onset  . Heart disease Mother      ROS:  Please see the history of present illness.   All other ROS reviewed and negative.     Physical Exam/Data:   Vitals:   01/21/18 0341 01/21/18 0848 01/21/18 1012 01/21/18 1100  BP: (!) 108/51 (!) 103/50 114/70 100/69  Pulse: (!) 51 60  (!) 53  Resp: 13 15  20   Temp: 97.6 F (36.4 C) 97.8 F (36.6 C)  (!) 97.5 F (36.4 C)  TempSrc: Oral Oral  Oral  SpO2: 97% 98%  95%  Weight: 88.6 kg     Height:        Intake/Output Summary (Last 24 hours) at 01/21/2018 1302 Last data filed at 01/21/2018 1200 Gross per 24 hour  Intake 480 ml  Output 1600 ml  Net -1120 ml   Filed Weights   01/19/18 0419 01/20/18 0146 01/21/18 0341  Weight: 89.8 kg 88.4 kg  88.6 kg   Body mass index is 35.73 kg/m.  General: Obese, elderly female HEENT: normal Neck: no JVD Endocrine:  No thryomegaly Vascular: No carotid bruits; FA pulses 2+ bilaterally without bruits  Cardiac:  normal S1, S2; RRR; no murmur  Lungs:  clear to auscultation bilaterally, no wheezing, rhonchi or rales  Abd: soft, nontender, no hepatomegaly  Ext: 1+ pitting edema of the ankles and trace lower legs.  Nonpitting, dependent edema of the left elbow area.  Musculoskeletal:  No deformities, BUE and BLE strength normal and equal Skin: warm and dry  Neuro: Left facial droop, clear speech, left hemiparesis Psych:  Normal affect   EKG:  The EKG was personally reviewed and demonstrates:  Atrial fibrillation with slow ventricular response, 42 bpm, LAD, QTC 457 Telemetry:  Telemetry was personally reviewed and demonstrates: A. fib in the 40s-50s with frequent 3-second pauses  Relevant CV Studies:  Echocardiogram 10/07/2017 Summary Normal left ventricular size and systolic function with no appreciable segmental abnormality. Ejection fraction is estimated at 65-70% Diastolic function appears indeterminate Normal left ventricular wall thickness Mild mitral annular calcification. Mild mitral regurgitation. Mild AI Mild aortic regurgitation. Estimated RVSP 55 mm Hg.  Laboratory Data:  Chemistry Recent Labs  Lab 01/19/18 0847 01/20/18 0144 01/21/18 0248  NA 141 142 140  K 3.6 3.6 3.4*  CL 99 98 98  CO2 29 31 31   GLUCOSE 99 122* 102*  BUN 10 15 14   CREATININE 0.61 0.87 0.84  CALCIUM 8.8* 8.5* 8.7*  GFRNONAA >60 >60 >60  GFRAA >60 >60 >60  ANIONGAP 13 13 11     No results for input(s): PROT, ALBUMIN, AST,  ALT, ALKPHOS, BILITOT in the last 168 hours. Hematology Recent Labs  Lab 01/17/18 1647 01/18/18 0211  WBC 9.6 9.5  RBC 4.05 4.06  HGB 11.0* 10.9*  HCT 35.7* 34.9*  MCV 88.1 86.0  MCH 27.2 26.8  MCHC 30.8 31.2  RDW 14.2 14.3  PLT 239 270   Cardiac EnzymesNo  results for input(s): TROPONINI in the last 168 hours.  Recent Labs  Lab 01/17/18 1850  TROPIPOC 0.00    BNP Recent Labs  Lab 01/17/18 1647  BNP 295.6*    DDimer No results for input(s): DDIMER in the last 168 hours.  Radiology/Studies:  Dg Chest 2 View  Result Date: 01/17/2018 CLINICAL DATA:  Shortness of breath. EXAM: CHEST - 2 VIEW COMPARISON:  None. FINDINGS: Cardiomegaly. Normal pulmonary vascularity. Atherosclerotic calcification of the aortic arch. Possible small left pleural effusion. No consolidation or pneumothorax. Elevation of the right hemidiaphragm. No acute osseous abnormality. IMPRESSION: 1. Cardiomegaly.  Possible small left pleural effusion. Electronically Signed   By: Obie DredgeWilliam T Derry M.D.   On: 01/17/2018 17:45    Assessment and Plan:   Persistent atrial fibrillation -First noted in September of this year in the setting of stroke.  Rates have been controlled, not on antiarrhythmic or other medications for rate. -The patient is anticoagulated with Eliquis 2.5 mg twice daily.  The patient is over 84 years of age, but her renal function is normal and her weight is above 60 kg, therefore she should be on 5 mg twice daily.  -She continues to be in A. fib with slow ventricular response 40s-50s  Bradycardia -Heart rate was noted to be down in the 30s overnight while sleeping.   -Patient has OSA but did not tolerate CPAP.  May be contributing to nocturnal bradycardia. -She is mildly hypokalemic with potassium 3.4.  Will check magnesium.  Recommend keeping potassium around 4 and magnesium around 2. -Review of medication list does not reveal any offending medications to cause bradycardia -The patient has left-sided hemiparesis related to stroke and is essentially total care.  She does not appear to have any significant symptoms with the little amount of activity that she does. No current indication for pacemaker.  -Can check thyroid function.  Acute on chronic diastolic  CHF -EF 65-70% by echo in 10/2017 in [place] -BNP 295.6- likely underrepresented due to large body habitus.  -Chest x-ray with cardiomegaly and possible small left pleural effusion -Patient has been diuresed with Lasix 80 mg IV twice daily, currently on hold.  She has had good urine output with net -5.4 L overall fluid balance since admission.  Weight is down from 200 pounds to 195 pounds. -Patient had acute respiratory failure, now improved on nasal cannula. -Wt in September was ~78 kg. Now 88.6 kg. Continue to diurese until fluid status improves and Wt is down some more.  -Needs daily wts at SNF and diuretics as indicated for increased wt.   Hypertension -Management with amlodipine 5 mg daily.  Blood pressure is well controlled  Hyperlipidemia -On atorvastatin 40 mg daily  Carotid artery stenosis -Right ICA 40-59%, left ICA 1-39% by ultrasound 10/06/2017      For questions or updates, please contact [facility] Please consult [email or web address] for contact info under     Signed, Berton BonJanine Hammond, NP  01/21/2018 1:02 PM  I have seen and examined the patient along with Berton BonJanine Hammond, NP.   I have reviewed the chart, notes and new data.  I agree with NP's note.  Key new  complaints: She clearly has memory difficulties and I do not think we can rely too much on her symptom report.  She thinks that she still gets in and out of bed by herself, weighs himself every day, prepares her own meals and goes by herself to the bathroom.  As far as I can tell she has not been able to do that since she had her stroke. Key examination changes: Dense left hemiparesis.  Obesity limits her exam but there is evidence of substantial volume gain that is improving (she has residual leg edema but now with some skin wrinkles).  Very difficult to see her jugular veins.  Weight is reported as 88.6 kg today, which is 10 kg higher than it was at the discharge from [facility] after her stroke. Key new  findings / data: Creatinine 0.84, mild hypokalemia, mild anemia.   PLAN: CHF: She appears to still have substantial volume overload, although improving since admission. Even making allowances for different facilities and a 42-month period during which she could have gained real weight due to inactivity, a 10 kg weight difference compared to her last hospital discharge suggest that she is still markedly volume overloaded. Continue diuresis. AFib w slow VR: She appears to be asymptomatic, her heart rate requirements are lower since she is essentially bedbound.  Pacemaker is not indicated at this time unless she has symptomatic pauses in excess of 3 seconds or asymptomatic pauses over 5 seconds.  Avoid all medications with negative chronotropic effect such as beta-blockers, verapamil, diltiazem.  Also use caution with medications that could slow the heart rate such as cholinesterase inhibitors and certain glaucoma drops.  Notes normal TSH in August, but would be worthwhile rechecking. Anticoagulation: I do not think the current dose of apixaban is appropriate.  The only reason to put on a low-dose would be advanced age, but she has large body habitus and normal renal function.  The right dose of apixaban would be 5 mg twice daily in my opinion.  Thurmon Fair, MD, [facility] [phone] 01/21/2018, 3:22 PM

## 2018-01-22 DIAGNOSIS — K766 Portal hypertension: Secondary | ICD-10-CM

## 2018-01-22 DIAGNOSIS — E7800 Pure hypercholesterolemia, unspecified: Secondary | ICD-10-CM

## 2018-01-22 DIAGNOSIS — I2721 Secondary pulmonary arterial hypertension: Secondary | ICD-10-CM

## 2018-01-22 LAB — BASIC METABOLIC PANEL
Anion gap: 12 (ref 5–15)
BUN: 17 mg/dL (ref 8–23)
CO2: 31 mmol/L (ref 22–32)
Calcium: 8.7 mg/dL — ABNORMAL LOW (ref 8.9–10.3)
Chloride: 100 mmol/L (ref 98–111)
Creatinine, Ser: 0.74 mg/dL (ref 0.44–1.00)
GFR calc Af Amer: >60 mL/min (ref >60)
GFR calc non Af Amer: >60 mL/min (ref >60)
Glucose, Bld: 105 mg/dL — ABNORMAL HIGH (ref 70–99)
Potassium: 4 mmol/L (ref 3.5–5.1)
Sodium: 143 mmol/L (ref 135–145)

## 2018-01-22 LAB — GLUCOSE, CAPILLARY
GLUCOSE-CAPILLARY: 109 mg/dL — AB (ref 70–99)
Glucose-Capillary: 113 mg/dL — ABNORMAL HIGH (ref 70–99)
Glucose-Capillary: 102 mg/dL — ABNORMAL HIGH (ref 70–99)
Glucose-Capillary: 95 mg/dL (ref 70–99)

## 2018-01-22 LAB — TSH: TSH: 3.075 u[IU]/mL (ref 0.350–4.500)

## 2018-01-22 MED ORDER — HYDROCODONE-ACETAMINOPHEN 5-325 MG PO TABS: 2.0000 | ORAL_TABLET | Freq: Once | ORAL | Status: DC

## 2018-01-22 NOTE — Progress Notes (Signed)
Progress Note  Patient Name: Margaret RobesDorothy Kulesza Date of Encounter: 01/22/2018  Primary Cardiologist: No primary care provider on file.   Subjective   No CV complaints. Net diuresis 830 mL/24h, 6.2L since admission. However, reported weight up 1.4 kg since yesterday and equal to admission - doubt accuracy. BUN and creat remain low,  Inpatient Medications    Scheduled Meds: . amitriptyline  10 mg Oral QHS  . amLODipine  5 mg Oral Daily  . apixaban  5 mg Oral BID  . atorvastatin  40 mg Oral QPM  . furosemide  40 mg Intravenous BID  . HYDROcodone-acetaminophen  2 tablet Oral Once  . oxyCODONE-acetaminophen  1 tablet Oral Q12H   And  . oxyCODONE  5 mg Oral BID  . pantoprazole  40 mg Oral Daily  . potassium chloride  40 mEq Oral Once  . pregabalin  25 mg Oral BID  . sodium chloride flush  3 mL Intravenous Q12H   Continuous Infusions:  PRN Meds: acetaminophen **OR** acetaminophen, albuterol, polyethylene glycol   Vital Signs    Vitals:   01/21/18 2300 01/22/18 0352 01/22/18 0458 01/22/18 0800  BP: (!) 114/46 (!) 119/46  (!) (P) 124/56  Pulse:    (!) (P) 52  Resp: 13 14  (P) 17  Temp: 97.6 F (36.4 C) (!) 97.5 F (36.4 C)  (P) 97.7 F (36.5 C)  TempSrc: Axillary Oral  (P) Oral  SpO2:    (P) 98%  Weight:   90 kg   Height:        Intake/Output Summary (Last 24 hours) at 01/22/2018 0850 Last data filed at 01/22/2018 0624 Gross per 24 hour  Intake 720 ml  Output 1550 ml  Net -830 ml   Filed Weights   01/20/18 0146 01/21/18 0341 01/22/18 0458  Weight: 88.4 kg 88.6 kg 90 kg    Telemetry    AFib with controlled rate, sometimes slow. "Pauses" and "asystole" related to near-isoelectric PVCs  - Personally Reviewed  ECG    AFib with slow ventricular rate - Personally Reviewed  Physical Exam  Obese GEN: No acute distress.  Sleepy, easily awoken Neck: No JVD Cardiac: irrregular, no murmurs, rubs, or gallops.  Respiratory: Clear to auscultation  bilaterally. GI: Soft, nontender, non-distended  MS: No edema; No deformity. Neuro:  L hemiparesis Psych: Normal affect   Labs    Chemistry Recent Labs  Lab 01/20/18 0144 01/21/18 0248 01/22/18 0129  NA 142 140 143  K 3.6 3.4* 4.0  CL 98 98 100  CO2 31 31 31   GLUCOSE 122* 102* 105*  BUN 15 14 17   CREATININE 0.87 0.84 0.74  CALCIUM 8.5* 8.7* 8.7*  GFRNONAA >60 >60 >60  GFRAA >60 >60 >60  ANIONGAP 13 11 12      Hematology Recent Labs  Lab 01/17/18 1647 01/18/18 0211  WBC 9.6 9.5  RBC 4.05 4.06  HGB 11.0* 10.9*  HCT 35.7* 34.9*  MCV 88.1 86.0  MCH 27.2 26.8  MCHC 30.8 31.2  RDW 14.2 14.3  PLT 239 270    Cardiac EnzymesNo results for input(s): TROPONINI in the last 168 hours.  Recent Labs  Lab 01/17/18 1850  TROPIPOC 0.00     BNP Recent Labs  Lab 01/17/18 1647  BNP 295.6*     DDimer No results for input(s): DDIMER in the last 168 hours.   Radiology    No results found.  Cardiac Studies   Echocardiogram 10/07/2017 Summary Normal left ventricular size and systolic function  with no appreciable segmental abnormality. Ejection fraction is estimated at 65-70% Diastolic function appears indeterminate Normal left ventricular wall thickness Mild mitral annular calcification. Mild mitral regurgitation. Mild AI Mild aortic regurgitation. Estimated RVSP 55 mm Hg.  Patient Profile     84 y.o. female with severe L hemiparesis following ischemic CVA, persistent atrial fibrillation with slow ventricular response and acute on chronic diastolic HF with mostly R heart failure findings  Assessment & Plan    1. CHF, R>L: related to pulmonary HTN, in turn probably related to obesity and OSA. Continue diuresis. Needs accurate weights. Will need daily weights after DC as well. 2. AFib: bradycardia is not symptomatic. Many of the reported pauses are artifactual due to near-isoelectric PVCs that are not recognized as QRS complexes by the monitor. None of the pauses  is severe enough to warrant pacemaker implantation at this time.  TSH normal. Avoid beta-blockers, verapamil, diltiazem and non-cardiac drugs with negative chronotropic effect (e.g. cholinesterase inhibitors). Appropriate Eliquis dose is 5 mg BID. CHADSVasc at least 8 (age 22, gender, HTN, CHF, PAD, CVA 2). 3. Moderate RICA stenosis: on statin. 4. HTN: controlled     For questions or updates, please contact [facility] Please consult [email or web address] for contact info under        Signed, Thurmon Fair, MD  01/22/2018, 8:50 AM

## 2018-01-22 NOTE — Progress Notes (Signed)
Pt wanted pain medication. After Pain med was ordered she was asleep.

## 2018-01-22 NOTE — Progress Notes (Signed)
PROGRESS NOTE    Margaret Collier  ZOX:[id number] DOB: Jun 14, 1931 DOA: 01/17/2018 PCP: System, Pcp Not In    Brief Narrative:   84 y.o. year old female with medical history significant for CVA with residual left-sided hemiparesis, atrial fibrillation on Eliquis, hypertension, hyperlipidemia, reported OSA not on CPAP who presented from her facility on 01/17/2018 with several days of progressive shortness of breath, lower leg swelling and was found to have acute hypoxic respiratory failure presumed secondary to CHF with preserved EF exacerbation.  Patient states for the past several days she hass noticed worsening dyspnea and lower leg swelling that she is treated intermittently with her home Lasix therapy (40 mg daily).  On ED evaluation patient had nonischemic EKG, negative troponin,.  Lab work-up notable for BNP of 295, hemoglobin of 11, chest x-ray showing cardiomegaly with possible small left pleural effusions. In the ED requiring submental oxygen with improvement.  Given IV Lasix 80 mg x 1 and admitted to hospitalist service for presumed CHF exacerbation.  Assessment & Plan:   Principal Problem:   Acute exacerbation of CHF (congestive heart failure) (HCC) Active Problems:   Atrial fibrillation (HCC)   Hypertension   Hyperlipidemia   OSA (obstructive sleep apnea)   History of CVA (cerebrovascular accident) without residual deficits   Bradycardia  #Bradycardia, slow ventricular response in patient with atrial fibrillation. Not on BB at home or here.  -Cardilogy following -Noted to be worse at night. Pt with known OSA, refusing CPAP, suspect leading to worsening bradycardia at night.  #Acute hypoxic respiratory failure presumed secondary to CHF exacerbation with preserved EF, improving.  Cardiology following. Continuing to diurese. Negative 6.2 L this admission. -Renal function stable -Continue to monitor I/o. Repeat bmet in AM  #Hypertension, currently at goal, controlled.  Continue norvasc as tolerated  #Paroxysmal Atrial fibrillation, currently rate controlled.  Continue Eliquis. No evidence of acute bleed  #Hyperlipidemia, stable continue home atorvastatin. Stable at this time  #History of nonhemorrhagic CVA (09/2017) with residual left-sided paralysis and left facial droop -No acute focal deficits, continue pregabalin, PRN home oxycodone, amitriptyline for neurologic pain control -Stable at present  #Hypokalemia, slightly worse.   -Suspect secondary to diuresis -Repeat bmet in AM, continue to replace as needed  #OSA not on CPAP Not tolerating CPAP  DVT prophylaxis: Eliquis Code Status: Full Family Communication: Pt in room, family not at bedside Disposition Plan: Uncertain at this time  Consultants:   Cardiology  Procedures:     Antimicrobials: Anti-infectives (From admission, onward)   None       Subjective: Feels better. Complaining of continued chronic pain  Objective: Vitals:   01/22/18 0352 01/22/18 0458 01/22/18 0800 01/22/18 1212  BP: (!) 119/46  (!) 124/56 121/63  Pulse:   (!) 52 (!) 58  Resp: 14  17 18   Temp: (!) 97.5 F (36.4 C)  97.7 F (36.5 C) 97.7 F (36.5 C)  TempSrc: Oral  Oral Axillary  SpO2:   98% 96%  Weight:  90 kg    Height:        Intake/Output Summary (Last 24 hours) at 01/22/2018 1530 Last data filed at 01/22/2018 1300 Gross per 24 hour  Intake 960 ml  Output 2275 ml  Net -1315 ml   Filed Weights   01/20/18 0146 01/21/18 0341 01/22/18 0458  Weight: 88.4 kg 88.6 kg 90 kg    Examination:  General exam: Appears calm and comfortable  Respiratory system: Clear to auscultation. Respiratory effort normal. Cardiovascular system: S1 & S2  heard, RRR Gastrointestinal system: Abdomen is nondistended, soft and nontender. No organomegaly or masses felt. Normal bowel sounds heard. Central nervous system: Alert and oriented. No focal neurological deficits. Extremities: Symmetric 5 x 5  power. Skin: No rashes, lesions Psychiatry: Judgement and insight appear normal. Mood & affect appropriate.   Data Reviewed: I have personally reviewed following labs and imaging studies  CBC: Recent Labs  Lab 01/17/18 1647 01/18/18 0211  WBC 9.6 9.5  NEUTROABS 6.9  --   HGB 11.0* 10.9*  HCT 35.7* 34.9*  MCV 88.1 86.0  PLT 239 270   Basic Metabolic Panel: Recent Labs  Lab 01/18/18 0211 01/19/18 0847 01/20/18 0144 01/21/18 0248 01/22/18 0129  NA 142 141 142 140 143  K 3.2* 3.6 3.6 3.4* 4.0  CL 100 99 98 98 100  CO2 28 29 31 31 31   GLUCOSE 98 99 122* 102* 105*  BUN 14 10 15 14 17   CREATININE 0.76 0.61 0.87 0.84 0.74  CALCIUM 9.0 8.8* 8.5* 8.7* 8.7*  MG  --   --   --  1.9  --    GFR: Estimated Creatinine Clearance: 54.6 mL/min (by C-G formula based on SCr of 0.74 mg/dL). Liver Function Tests: No results for input(s): AST, ALT, ALKPHOS, BILITOT, PROT, ALBUMIN in the last 168 hours. No results for input(s): LIPASE, AMYLASE in the last 168 hours. No results for input(s): AMMONIA in the last 168 hours. Coagulation Profile: No results for input(s): INR, PROTIME in the last 168 hours. Cardiac Enzymes: No results for input(s): CKTOTAL, CKMB, CKMBINDEX, TROPONINI in the last 168 hours. BNP (last 3 results) No results for input(s): PROBNP in the last 8760 hours. HbA1C: No results for input(s): HGBA1C in the last 72 hours. CBG: Recent Labs  Lab 01/21/18 1131 01/21/18 1731 01/21/18 2145 01/22/18 0804 01/22/18 1142  GLUCAP 129* 93 102* 113* 102*   Lipid Profile: No results for input(s): CHOL, HDL, LDLCALC, TRIG, CHOLHDL, LDLDIRECT in the last 72 hours. Thyroid Function Tests: Recent Labs    01/22/18 0129  TSH 3.075   Anemia Panel: No results for input(s): VITAMINB12, FOLATE, FERRITIN, TIBC, IRON, RETICCTPCT in the last 72 hours. Sepsis Labs: No results for input(s): PROCALCITON, LATICACIDVEN in the last 168 hours.  Recent Results (from the past 240 hour(s))   MRSA PCR Screening     Status: None   Collection Time: 01/18/18 12:20 AM  Result Value Ref Range Status   MRSA by PCR NEGATIVE NEGATIVE Final    Comment:        The GeneXpert MRSA Assay (FDA approved for NASAL specimens only), is one component of a comprehensive MRSA colonization surveillance program. It is not intended to diagnose MRSA infection nor to guide or monitor treatment for MRSA infections. Performed at [facility], [address] [place], [place] [address]      Radiology Studies: No results found.  Scheduled Meds: . amitriptyline  10 mg Oral QHS  . amLODipine  5 mg Oral Daily  . apixaban  5 mg Oral BID  . atorvastatin  40 mg Oral QPM  . furosemide  40 mg Intravenous BID  . HYDROcodone-acetaminophen  2 tablet Oral Once  . oxyCODONE-acetaminophen  1 tablet Oral Q12H   And  . oxyCODONE  5 mg Oral BID  . pantoprazole  40 mg Oral Daily  . potassium chloride  40 mEq Oral Once  . pregabalin  25 mg Oral BID  . sodium chloride flush  3 mL Intravenous Q12H  Continuous Infusions:   LOS: 4 days   Rickey Barbara, MD [facility] Pager On Amion  If 7PM-7AM, please contact night-coverage 01/22/2018, 3:30 PM

## 2018-01-22 NOTE — Progress Notes (Signed)
Pt's niece P.O.A. will be here from [place] on Friday 20th. She was updated on plan of care: lasix, CPap H/S and daily mobilizing with hoyer lift. Niece made aware of strict fluid restriction during hospital stay. If not regulated pt requests multiple drinks from multiple staff members.

## 2018-01-23 DIAGNOSIS — Z8673 Personal history of transient ischemic attack (TIA), and cerebral infarction without residual deficits: Secondary | ICD-10-CM

## 2018-01-23 DIAGNOSIS — I509 Heart failure, unspecified: Secondary | ICD-10-CM

## 2018-01-23 LAB — BASIC METABOLIC PANEL
Anion gap: 12 (ref 5–15)
BUN: 16 mg/dL (ref 8–23)
CO2: 30 mmol/L (ref 22–32)
CREATININE: 0.64 mg/dL (ref 0.44–1.00)
Calcium: 8.6 mg/dL — ABNORMAL LOW (ref 8.9–10.3)
Chloride: 101 mmol/L (ref 98–111)
GFR calc non Af Amer: >60 mL/min (ref >60)
Glucose, Bld: 102 mg/dL — ABNORMAL HIGH (ref 70–99)
Potassium: 3.4 mmol/L — ABNORMAL LOW (ref 3.5–5.1)
Sodium: 143 mmol/L (ref 135–145)

## 2018-01-23 LAB — GLUCOSE, CAPILLARY
Glucose-Capillary: 85 mg/dL (ref 70–99)
Glucose-Capillary: 87 mg/dL (ref 70–99)
Glucose-Capillary: 126 mg/dL — ABNORMAL HIGH (ref 70–99)
Glucose-Capillary: 97 mg/dL (ref 70–99)

## 2018-01-23 MED ORDER — POTASSIUM CHLORIDE CRYS ER 20 MEQ PO TBCR: 40.0000 meq | EXTENDED_RELEASE_TABLET | Freq: Once | ORAL | Status: DC

## 2018-01-23 MED ORDER — POTASSIUM CHLORIDE 20 MEQ/15ML (10%) PO SOLN
40.0000 meq | Freq: Once | ORAL | Status: AC
  Administered 2018-01-23: 40 meq via ORAL
  Filled 2018-01-23: qty 30

## 2018-01-23 NOTE — Progress Notes (Signed)
Progress Note  Patient Name: Margaret RobesDorothy Collier Date of Encounter: 01/23/2018  Primary Cardiologist: No primary care provider on file. New  Subjective   Still refusing CPAP. Continues to have bradycardia (persistent atrial fibrillation with slow ventricular response), mostly in the 50s, sometimes down into the low 40s especially at night.  Asymptomatic. Another liter of net diuresis since yesterday (total of 7 L since admission). No weight recorded today.  Inpatient Medications    Scheduled Meds: . amitriptyline  10 mg Oral QHS  . amLODipine  5 mg Oral Daily  . apixaban  5 mg Oral BID  . atorvastatin  40 mg Oral QPM  . furosemide  40 mg Intravenous BID  . HYDROcodone-acetaminophen  2 tablet Oral Once  . oxyCODONE-acetaminophen  1 tablet Oral Q12H   And  . oxyCODONE  5 mg Oral BID  . pantoprazole  40 mg Oral Daily  . pregabalin  25 mg Oral BID  . sodium chloride flush  3 mL Intravenous Q12H   Continuous Infusions:  PRN Meds: acetaminophen **OR** acetaminophen, albuterol, polyethylene glycol   Vital Signs    Vitals:   01/23/18 0251 01/23/18 0755 01/23/18 1039 01/23/18 1150  BP: (!) 96/54 (!) 106/50 (!) 100/55 (!) 121/54  Pulse: (!) 54 (!) 50  (!) 55  Resp: 13 15  14   Temp: 98.3 F (36.8 C) 97.6 F (36.4 C)  97.7 F (36.5 C)  TempSrc: Axillary Oral  Oral  SpO2: 98% 98%  97%  Weight:      Height:        Intake/Output Summary (Last 24 hours) at 01/23/2018 1203 Last data filed at 01/23/2018 0931 Gross per 24 hour  Intake 483 ml  Output 1925 ml  Net -1442 ml   Filed Weights   01/20/18 0146 01/21/18 0341 01/22/18 0458  Weight: 88.4 kg 88.6 kg 90 kg    Telemetry    Atrial fibrillation with slow ventricular response- Personally Reviewed  ECG    No new tracing- Personally Reviewed  Physical Exam  Obesity makes for difficult exam GEN: No acute distress.   Neck: No JVD Cardiac:  Irregular and slow, no murmurs, rubs, or gallops.  Respiratory: Clear to  auscultation bilaterally. GI: Soft, nontender, non-distended  MS: No edema; No deformity. Neuro:  Nonfocal  Psych: Normal affect   Labs    Chemistry Recent Labs  Lab 01/21/18 0248 01/22/18 0129 01/23/18 0201  NA 140 143 143  K 3.4* 4.0 3.4*  CL 98 100 101  CO2 31 31 30   GLUCOSE 102* 105* 102*  BUN 14 17 16   CREATININE 0.84 0.74 0.64  CALCIUM 8.7* 8.7* 8.6*  GFRNONAA >60 >60 >60  GFRAA >60 >60 >60  ANIONGAP 11 12 12      Hematology Recent Labs  Lab 01/17/18 1647 01/18/18 0211  WBC 9.6 9.5  RBC 4.05 4.06  HGB 11.0* 10.9*  HCT 35.7* 34.9*  MCV 88.1 86.0  MCH 27.2 26.8  MCHC 30.8 31.2  RDW 14.2 14.3  PLT 239 270    Cardiac EnzymesNo results for input(s): TROPONINI in the last 168 hours.  Recent Labs  Lab 01/17/18 1850  TROPIPOC 0.00     BNP Recent Labs  Lab 01/17/18 1647  BNP 295.6*     DDimer No results for input(s): DDIMER in the last 168 hours.   Radiology    No results found.  Cardiac Studies   Echocardiogram 10/07/2017 Summary Normal left ventricular size and systolic function with no appreciable segmental abnormality.  Ejection fraction is estimated at 65-70%. Diastolic function appears indeterminate. Normal left ventricular wall thickness Mild mitral annular calcification. Mild mitral regurgitation. Mild aortic regurgitation. Estimated RVSP 55 mm Hg.  Patient Profile     84 y.o. female with severe L hemiparesis following ischemic CVA, persistent atrial fibrillation with slow ventricular response and acute on chronic diastolic HF with mostly R heart failure findings  Assessment & Plan    1. CHF, R>L: related to pulmonary HTN, in turn probably related to obesity and OSA. Continue diuresis.  She requests drinks from multiple staff members and is not understand the need to comply with fluid restriction.  Hopefully all of her intake is been recorded.  We have to rely on accurate daily weights, unfortunately this have to be obtained with the bed  since she cannot stand on a scale.  Will need daily weights after DC as well. 2. AFib:  Slow ventricular response asymptomatic.. Many of the reported pauses are artifactual due to near-isoelectric PVCs that are not recognized as QRS complexes by the monitor. None of the pauses is severe enough to warrant pacemaker implantation at this time.  TSH normal. Avoid beta-blockers, verapamil, diltiazem and non-cardiac drugs with negative chronotropic effect (e.g. cholinesterase inhibitors). Appropriate Eliquis dose is 5 mg BID. CHADSVasc at least 8 (age 36, gender, HTN, CHF, PAD, CVA 2). 3. Moderate RICA stenosis: on statin.      For questions or updates, please contact [facility] Please consult [email or web address] for contact info under        Signed, Thurmon FairMihai Lyric Rossano, MD  01/23/2018, 12:03 PM

## 2018-01-23 NOTE — Progress Notes (Signed)
PROGRESS NOTE    Margaret RobesDorothy Hedgepeth  ZOX:[id number] DOB: 11/22/1931 DOA: 01/17/2018 PCP: System, Pcp Not In    Brief Narrative:   84 y.o. year old female with medical history significant for CVA with residual left-sided hemiparesis, atrial fibrillation on Eliquis, hypertension, hyperlipidemia, reported OSA not on CPAP who presented from her facility on 01/17/2018 with several days of progressive shortness of breath, lower leg swelling and was found to have acute hypoxic respiratory failure presumed secondary to CHF with preserved EF exacerbation.  Patient states for the past several days she hass noticed worsening dyspnea and lower leg swelling that she is treated intermittently with her home Lasix therapy (40 mg daily).  On ED evaluation patient had nonischemic EKG, negative troponin,.  Lab work-up notable for BNP of 295, hemoglobin of 11, chest x-ray showing cardiomegaly with possible small left pleural effusions. In the ED requiring submental oxygen with improvement.  Given IV Lasix 80 mg x 1 and admitted to hospitalist service for presumed CHF exacerbation.  Assessment & Plan:   Principal Problem:   Acute exacerbation of CHF (congestive heart failure) (HCC) Active Problems:   Atrial fibrillation (HCC)   Hypertension   Hyperlipidemia   OSA (obstructive sleep apnea)   History of CVA (cerebrovascular accident) without residual deficits   Bradycardia  #Bradycardia, slow ventricular response in patient with atrial fibrillation. Not on BB at home or here.  -Cardilogy following -Noted to be worse at night. Pt with known OSA, refusing CPAP, suspect leading to worsening bradycardia at night. -Asymptomatic. Discussed with Cardiology today. Cont to monitor for now  #Acute hypoxic respiratory failure presumed secondary to CHF exacerbation with preserved EF, improving.  Cardiology following. Continuing to diurese. Negative 7.2 L this admission. -Renal function remains stable -Continue to  monitor I/o. Repeat bmet in AM  #Hypertension, currently at goal, controlled. Had been continued on norvasc, on hold today secondary to soft BP  #Paroxysmal Atrial fibrillation, currently rate controlled.  Continue Eliquis. No evidence of acute bleed at this time  #Hyperlipidemia, stable continue home atorvastatin. Stable at this time  #History of nonhemorrhagic CVA (09/2017) with residual left-sided paralysis and left facial droop -No acute focal deficits, continue pregabalin, PRN home oxycodone, amitriptyline for neurologic pain control -Remains stable at present  #Hypokalemia, slightly worse.   -Suspect secondary to diuresis -Repeat bmet in AM, continue to replace as needed  #OSA not on CPAP Has not been tolerating CPAP  DVT prophylaxis: Eliquis Code Status: Full Family Communication: Pt in room, family not at bedside Disposition Plan: Uncertain at this time  Consultants:   Cardiology  Procedures:     Antimicrobials: Anti-infectives (From admission, onward)   None      Subjective: Without complaints. Still with leg swelling  Objective: Vitals:   01/23/18 0251 01/23/18 0755 01/23/18 1039 01/23/18 1150  BP: (!) 96/54 (!) 106/50 (!) 100/55 (!) 121/54  Pulse: (!) 54 (!) 50  (!) 55  Resp: 13 15  14   Temp: 98.3 F (36.8 C) 97.6 F (36.4 C)  97.7 F (36.5 C)  TempSrc: Axillary Oral  Oral  SpO2: 98% 98%  97%  Weight:      Height:        Intake/Output Summary (Last 24 hours) at 01/23/2018 1429 Last data filed at 01/23/2018 1300 Gross per 24 hour  Intake 945 ml  Output 1200 ml  Net -255 ml   Filed Weights   01/20/18 0146 01/21/18 0341 01/22/18 0458  Weight: 88.4 kg 88.6 kg 90 kg  Examination: General exam: Awake, laying in bed, in nad Respiratory system: Normal respiratory effort, no wheezing Cardiovascular system: regular rate, s1, s2 Gastrointestinal system: Soft, nondistended, positive BS Central nervous system: CN2-12 grossly intact,  strength intact Extremities: Perfused, no clubbing, BLE edema Skin: Normal skin turgor, no notable skin lesions seen Psychiatry: Mood normal // no visual hallucinations   Data Reviewed: I have personally reviewed following labs and imaging studies  CBC: Recent Labs  Lab 01/17/18 1647 01/18/18 0211  WBC 9.6 9.5  NEUTROABS 6.9  --   HGB 11.0* 10.9*  HCT 35.7* 34.9*  MCV 88.1 86.0  PLT 239 270   Basic Metabolic Panel: Recent Labs  Lab 01/19/18 0847 01/20/18 0144 01/21/18 0248 01/22/18 0129 01/23/18 0201  NA 141 142 140 143 143  K 3.6 3.6 3.4* 4.0 3.4*  CL 99 98 98 100 101  CO2 29 31 31 31 30   GLUCOSE 99 122* 102* 105* 102*  BUN 10 15 14 17 16   CREATININE 0.61 0.87 0.84 0.74 0.64  CALCIUM 8.8* 8.5* 8.7* 8.7* 8.6*  MG  --   --  1.9  --   --    GFR: Estimated Creatinine Clearance: 54.6 mL/min (by C-G formula based on SCr of 0.64 mg/dL). Liver Function Tests: No results for input(s): AST, ALT, ALKPHOS, BILITOT, PROT, ALBUMIN in the last 168 hours. No results for input(s): LIPASE, AMYLASE in the last 168 hours. No results for input(s): AMMONIA in the last 168 hours. Coagulation Profile: No results for input(s): INR, PROTIME in the last 168 hours. Cardiac Enzymes: No results for input(s): CKTOTAL, CKMB, CKMBINDEX, TROPONINI in the last 168 hours. BNP (last 3 results) No results for input(s): PROBNP in the last 8760 hours. HbA1C: No results for input(s): HGBA1C in the last 72 hours. CBG: Recent Labs  Lab 01/22/18 1142 01/22/18 1632 01/22/18 2112 01/23/18 0757 01/23/18 1147  GLUCAP 102* 109* 95 85 87   Lipid Profile: No results for input(s): CHOL, HDL, LDLCALC, TRIG, CHOLHDL, LDLDIRECT in the last 72 hours. Thyroid Function Tests: Recent Labs    01/22/18 0129  TSH 3.075   Anemia Panel: No results for input(s): VITAMINB12, FOLATE, FERRITIN, TIBC, IRON, RETICCTPCT in the last 72 hours. Sepsis Labs: No results for input(s): PROCALCITON, LATICACIDVEN in the  last 168 hours.  Recent Results (from the past 240 hour(s))  MRSA PCR Screening     Status: None   Collection Time: 01/18/18 12:20 AM  Result Value Ref Range Status   MRSA by PCR NEGATIVE NEGATIVE Final    Comment:        The GeneXpert MRSA Assay (FDA approved for NASAL specimens only), is one component of a comprehensive MRSA colonization surveillance program. It is not intended to diagnose MRSA infection nor to guide or monitor treatment for MRSA infections. Performed at [facility], [address], [place], [place] [address]      Radiology Studies: No results found.  Scheduled Meds: . amitriptyline  10 mg Oral QHS  . amLODipine  5 mg Oral Daily  . apixaban  5 mg Oral BID  . atorvastatin  40 mg Oral QPM  . furosemide  40 mg Intravenous BID  . HYDROcodone-acetaminophen  2 tablet Oral Once  . oxyCODONE-acetaminophen  1 tablet Oral Q12H   And  . oxyCODONE  5 mg Oral BID  . pantoprazole  40 mg Oral Daily  . pregabalin  25 mg Oral BID  . sodium chloride flush  3 mL Intravenous Q12H  Continuous Infusions:   LOS: 5 days   Rickey BarbaraStephen Chiu, MD [facility] Pager On Amion  If 7PM-7AM, please contact night-coverage 01/23/2018, 2:29 PM

## 2018-01-23 NOTE — Discharge Instructions (Signed)

## 2018-01-24 DIAGNOSIS — I4819 Other persistent atrial fibrillation: Secondary | ICD-10-CM

## 2018-01-24 LAB — GLUCOSE, CAPILLARY
GLUCOSE-CAPILLARY: 113 mg/dL — AB (ref 70–99)
Glucose-Capillary: 79 mg/dL (ref 70–99)
Glucose-Capillary: 92 mg/dL (ref 70–99)
Glucose-Capillary: 80 mg/dL (ref 70–99)

## 2018-01-24 LAB — BASIC METABOLIC PANEL
Anion gap: 12 (ref 5–15)
BUN: 18 mg/dL (ref 8–23)
CO2: 30 mmol/L (ref 22–32)
Calcium: 8.9 mg/dL (ref 8.9–10.3)
Chloride: 100 mmol/L (ref 98–111)
Creatinine, Ser: 0.8 mg/dL (ref 0.44–1.00)
GFR calc Af Amer: >60 mL/min (ref >60)
GFR calc non Af Amer: >60 mL/min (ref >60)
Glucose, Bld: 93 mg/dL (ref 70–99)
Potassium: 3.8 mmol/L (ref 3.5–5.1)
Sodium: 142 mmol/L (ref 135–145)

## 2018-01-24 MED ORDER — FUROSEMIDE 10 MG/ML IJ SOLN
80.0000 mg | Freq: Two times a day (BID) | INTRAMUSCULAR | Status: DC
  Administered 2018-01-24 – 2018-01-27 (×6): 80 mg via INTRAVENOUS
  Filled 2018-01-24 (×6): qty 8

## 2018-01-24 MED ORDER — SPIRONOLACTONE 25 MG PO TABS: 25.0000 mg | ORAL_TABLET | Freq: Every day | ORAL | Status: DC

## 2018-01-24 MED ORDER — SPIRONOLACTONE 12.5 MG HALF TABLET
12.5000 mg | ORAL_TABLET | Freq: Every day | ORAL | Status: DC
  Administered 2018-01-24 – 2018-01-30 (×7): 12.5 mg via ORAL
  Filled 2018-01-24 (×7): qty 1

## 2018-01-24 NOTE — Progress Notes (Signed)
OT Cancellation Note  Patient Details Name: Margaret RobesDorothy Collier MRN: [id number] DOB: [birth year]   Cancelled Treatment:    Reason Eval/Treat Not Completed: OT screened, no needs identified, will sign off(Pt with baseline dependence in ADL and mobility.)  Evern BioMayberry, [name] Lynn 01/24/2018, 11:57 AM  Martie RoundJulie Contrina Orona, OTR/L Acute Rehabilitation Services Pager: [phone] Office: [phone]

## 2018-01-24 NOTE — Progress Notes (Signed)
Progress Note  Patient Name: Margaret RobesDorothy Collier Date of Encounter: 01/24/2018  Primary Cardiologist: No primary care provider on file. new  Subjective   Alert, comfortable up in chair. Dry cough. No progress with diuresis last 24 h. Net neutral diuresis last 24 h. If weight is accurate, down 10 lb since admission, but still about 18 lb above her DC weight in September. BP lower.  Inpatient Medications    Scheduled Meds: . amitriptyline  10 mg Oral QHS  . apixaban  5 mg Oral BID  . atorvastatin  40 mg Oral QPM  . furosemide  80 mg Intravenous BID  . HYDROcodone-acetaminophen  2 tablet Oral Once  . oxyCODONE-acetaminophen  1 tablet Oral Q12H   And  . oxyCODONE  5 mg Oral BID  . pantoprazole  40 mg Oral Daily  . pregabalin  25 mg Oral BID  . sodium chloride flush  3 mL Intravenous Q12H  . spironolactone  12.5 mg Oral Daily   Continuous Infusions:  PRN Meds: acetaminophen **OR** acetaminophen, albuterol, polyethylene glycol   Vital Signs    Vitals:   01/23/18 2311 01/24/18 0314 01/24/18 0459 01/24/18 0745  BP: (!) 107/53 (!) 114/43  96/74  Pulse: (!) 48 (!) 53  (!) 52  Resp: 13 17  17   Temp: 97.7 F (36.5 C) 98 F (36.7 C)  (!) 97.4 F (36.3 C)  TempSrc: Oral Oral  Oral  SpO2: 100% 100%  94%  Weight:   86.4 kg   Height:        Intake/Output Summary (Last 24 hours) at 01/24/2018 0829 Last data filed at 01/24/2018 0315 Gross per 24 hour  Intake 705 ml  Output 1050 ml  Net -345 ml   Filed Weights   01/21/18 0341 01/22/18 0458 01/24/18 0459  Weight: 88.6 kg 90 kg 86.4 kg    Telemetry    AFib with mostly slow ventricular rate 35 minimum at night, 50s-60s during the day - Personally Reviewed  ECG    No new tracing - Personally Reviewed  Physical Exam  Alert GEN: No acute distress.   Neck: 6-8 cm JVD Cardiac: RRR, no murmurs, rubs, or gallops.  Respiratory: Clear to auscultation bilaterally. GI: Soft, nontender, non-distended  MS: Softer 2+  bilateral edema; No deformity. Neuro:  L hemiparesis Psych: Normal affect   Labs    Chemistry Recent Labs  Lab 01/22/18 0129 01/23/18 0201 01/24/18 0318  NA 143 143 142  K 4.0 3.4* 3.8  CL 100 101 100  CO2 31 30 30   GLUCOSE 105* 102* 93  BUN 17 16 18   CREATININE 0.74 0.64 0.80  CALCIUM 8.7* 8.6* 8.9  GFRNONAA >60 >60 >60  GFRAA >60 >60 >60  ANIONGAP 12 12 12      Hematology Recent Labs  Lab 01/17/18 1647 01/18/18 0211  WBC 9.6 9.5  RBC 4.05 4.06  HGB 11.0* 10.9*  HCT 35.7* 34.9*  MCV 88.1 86.0  MCH 27.2 26.8  MCHC 30.8 31.2  RDW 14.2 14.3  PLT 239 270    Cardiac EnzymesNo results for input(s): TROPONINI in the last 168 hours.  Recent Labs  Lab 01/17/18 1850  TROPIPOC 0.00     BNP Recent Labs  Lab 01/17/18 1647  BNP 295.6*     DDimer No results for input(s): DDIMER in the last 168 hours.   Radiology    No results found.  Cardiac Studies   Echocardiogram 10/07/2017 Summary Normal left ventricular size and systolic function with no appreciable  segmental abnormality. Ejection fraction is estimated at 65-70% Diastolic function appears indeterminate Normal left ventricular wall thickness Mild mitral annular calcification. Mild mitral regurgitation. Mild AI Mild aortic regurgitation. Estimated RVSP 55 mm Hg.  Patient Profile     84 y.o. female  with severe L hemiparesis following ischemic CVA, persistentatrial fibrillationwith slow ventricular response and acute on chronic diastolic HF with mostly R heart failure findings  Assessment & Plan    1. CHF, R>L:related to pulmonary HTN, in turn probably related to obesity and OSA. Increase loop diuretic, add low dose spironolactone.  Stop amlodipine to allow BP to rise.  Will need daily weights after DC as well. 2. AFib: Slow ventricular response asymptomatic. None of the pauses is severe enough to warrant pacemaker implantation at this time. TSH normal. Avoid beta-blockers, verapamil, diltiazem  and non-cardiac drugs with negative chronotropic effect (e.g. cholinesterase inhibitors). Appropriate Eliquis dose is 5 mg BID. CHADSVasc at least 8 (age 25, gender, HTN, CHF, PAD, CVA 2). 3. Moderate RICA stenosis:on statin. 4. Hemiparesis p CVA     For questions or updates, please contact [facility] Please consult [email or web address] for contact info under        Signed, Thurmon FairMihai Joani Cosma, MD  01/24/2018, 8:29 AM

## 2018-01-24 NOTE — Care Management Important Message (Signed)
Important Message  Patient Details  Name: Margaret RobesDorothy Kamiya MRN: [id number] Date of Birth: 10/12/1931   Medicare Important Message Given:  Yes    Metta Koranda P Colson Barco 01/24/2018, 3:41 PM

## 2018-01-24 NOTE — Plan of Care (Signed)
  Problem: Education: Goal: Knowledge of General Education information will improve Description: Including pain rating scale, medication(s)/side effects and non-pharmacologic comfort measures Outcome: Progressing   Problem: Health Behavior/Discharge Planning: Goal: Ability to manage health-related needs will improve Outcome: Progressing   Problem: Clinical Measurements: Goal: Ability to maintain clinical measurements within normal limits will improve Outcome: Progressing Goal: Will remain free from infection Outcome: Progressing Goal: Diagnostic test results will improve Outcome: Progressing Goal: Respiratory complications will improve Outcome: Progressing Goal: Cardiovascular complication will be avoided Outcome: Progressing   Problem: Nutrition: Goal: Adequate nutrition will be maintained Outcome: Progressing   Problem: Coping: Goal: Level of anxiety will decrease Outcome: Progressing   Problem: Elimination: Goal: Will not experience complications related to bowel motility Outcome: Progressing Goal: Will not experience complications related to urinary retention Outcome: Progressing   Problem: Pain Managment: Goal: General experience of comfort will improve Outcome: Progressing   Problem: Safety: Goal: Ability to remain free from injury will improve Outcome: Progressing   Problem: Skin Integrity: Goal: Risk for impaired skin integrity will decrease Outcome: Progressing   Problem: Education: Goal: Ability to demonstrate management of disease process will improve Outcome: Progressing Goal: Ability to verbalize understanding of medication therapies will improve Outcome: Progressing Goal: Individualized Educational Video(s) Outcome: Progressing   Problem: Activity: Goal: Capacity to carry out activities will improve Outcome: Progressing   Problem: Cardiac: Goal: Ability to achieve and maintain adequate cardiopulmonary perfusion will improve Outcome:  Progressing   

## 2018-01-24 NOTE — Progress Notes (Signed)
PT Cancellation Note  Patient Details Name: Margaret RobesDorothy Collier MRN: [id number] DOB: May 11, 1931   Cancelled Treatment:    Reason Eval/Treat Not Completed: PT screened, no needs identified, will sign off. Pt from long term care and is dependent with all mobility and uses hoyer lift at facility.    Angelina OkCary W [facility] 01/24/2018, 11:10 AM Skip Mayerary Napolean Sia PT Acute Rehabilitation Services Pager [phone] Office [phone]

## 2018-01-24 NOTE — Clinical Social Work Note (Signed)
CSW continues to follow for discharge. Per MD, patient is a potential weekend discharge. Notified SNF admissions coordinator.  Charlynn CourtSarah Lakin Romer, CSW [phone]

## 2018-01-24 NOTE — Progress Notes (Signed)
PROGRESS NOTE    Margaret RobesDorothy Collier  ZDG:[id number] DOB: [birth year] DOA: 01/17/2018 PCP: System, Pcp Not In    Brief Narrative:   84 y.o. year old female with medical history significant for CVA with residual left-sided hemiparesis, atrial fibrillation on Eliquis, hypertension, hyperlipidemia, reported OSA not on CPAP who presented from her facility on 01/17/2018 with several days of progressive shortness of breath, lower leg swelling and was found to have acute hypoxic respiratory failure presumed secondary to CHF with preserved EF exacerbation.  Patient states for the past several days she hass noticed worsening dyspnea and lower leg swelling that she is treated intermittently with her home Lasix therapy (40 mg daily).  On ED evaluation patient had nonischemic EKG, negative troponin,.  Lab work-up notable for BNP of 295, hemoglobin of 11, chest x-ray showing cardiomegaly with possible small left pleural effusions. In the ED requiring submental oxygen with improvement.  Given IV Lasix 80 mg x 1 and admitted to hospitalist service for presumed CHF exacerbation.  Assessment & Plan:   Principal Problem:   Acute exacerbation of CHF (congestive heart failure) (HCC) Active Problems:   Atrial fibrillation (HCC)   Hypertension   Hyperlipidemia   OSA (obstructive sleep apnea)   History of CVA (cerebrovascular accident) without residual deficits   Bradycardia  #Bradycardia, slow ventricular response in patient with atrial fibrillation. Not on BB at home or here.  -Cardilogy following -Noted to be worse at night. Pt with known OSA, refusing CPAP, suspect leading to worsening bradycardia at night. -HR remains stable, asymptomatic, monitoring on tele  #Acute hypoxic respiratory failure presumed secondary to CHF exacerbation with preserved EF, improving.  Cardiology following. Continuing to diurese. Negative 7.7 L this admission. -Still 18lbs over dry wt -previous dry wt noted to be  78kg -Renal function remains stable -Continue to monitor I/o. Repeat bmet in AM -Spironolactone added per Cardiology  #Hypertension, currently at goal, controlled. Had been continued on norvasc, on hold today secondary to soft BP  #Paroxysmal Atrial fibrillation, currently rate controlled.  Continue Eliquis. Without evidence of bleeding  #Hyperlipidemia, stable continue home atorvastatin. Stable at this time  #History of nonhemorrhagic CVA (09/2017) with residual left-sided paralysis and left facial droop -No acute focal deficits, continue pregabalin, PRN home oxycodone, amitriptyline for neurologic pain control -Remains stable at present  #Hypokalemia, slightly worse.   -Suspect secondary to diuresis -Stable. Repeat bmet in AM  #OSA not on CPAP Has not been tolerating CPAP  DVT prophylaxis: Eliquis Code Status: Full Family Communication: Pt in room, family not at bedside Disposition Plan: Uncertain at this time  Consultants:   Cardiology  Procedures:     Antimicrobials: Anti-infectives (From admission, onward)   None      Subjective: Swollen legs this AM, denies sob  Objective: Vitals:   01/24/18 0314 01/24/18 0459 01/24/18 0745 01/24/18 1202  BP: (!) 114/43  96/74 (!) 121/53  Pulse: (!) 53  (!) 52 (!) 47  Resp: 17  17 16   Temp: 98 F (36.7 C)  (!) 97.4 F (36.3 C) 97.8 F (36.6 C)  TempSrc: Oral  Oral Oral  SpO2: 100%  94% 97%  Weight:  86.4 kg    Height:        Intake/Output Summary (Last 24 hours) at 01/24/2018 1503 Last data filed at 01/24/2018 1200 Gross per 24 hour  Intake 300 ml  Output 1550 ml  Net -1250 ml   Filed Weights   01/21/18 0341 01/22/18 0458 01/24/18 0459  Weight: 88.6 kg  90 kg 86.4 kg    Examination: General exam: Conversant, in no acute distress Respiratory system: normal chest rise, clear, no audible wheezing Cardiovascular system: regular rhythm, s1-s2 Gastrointestinal system: Nondistended, nontender, pos  BS Central nervous system: No seizures, no tremors Extremities: No cyanosis, no joint deformities, BLE edema Skin: No rashes, no pallor Psychiatry: Affect normal // no auditory hallucinations   Data Reviewed: I have personally reviewed following labs and imaging studies  CBC: Recent Labs  Lab 01/17/18 1647 01/18/18 0211  WBC 9.6 9.5  NEUTROABS 6.9  --   HGB 11.0* 10.9*  HCT 35.7* 34.9*  MCV 88.1 86.0  PLT 239 270   Basic Metabolic Panel: Recent Labs  Lab 01/20/18 0144 01/21/18 0248 01/22/18 0129 01/23/18 0201 01/24/18 0318  NA 142 140 143 143 142  K 3.6 3.4* 4.0 3.4* 3.8  CL 98 98 100 101 100  CO2 31 31 31 30 30   GLUCOSE 122* 102* 105* 102* 93  BUN 15 14 17 16 18   CREATININE 0.87 0.84 0.74 0.64 0.80  CALCIUM 8.5* 8.7* 8.7* 8.6* 8.9  MG  --  1.9  --   --   --    GFR: Estimated Creatinine Clearance: 53.4 mL/min (by C-G formula based on SCr of 0.8 mg/dL). Liver Function Tests: No results for input(s): AST, ALT, ALKPHOS, BILITOT, PROT, ALBUMIN in the last 168 hours. No results for input(s): LIPASE, AMYLASE in the last 168 hours. No results for input(s): AMMONIA in the last 168 hours. Coagulation Profile: No results for input(s): INR, PROTIME in the last 168 hours. Cardiac Enzymes: No results for input(s): CKTOTAL, CKMB, CKMBINDEX, TROPONINI in the last 168 hours. BNP (last 3 results) No results for input(s): PROBNP in the last 8760 hours. HbA1C: No results for input(s): HGBA1C in the last 72 hours. CBG: Recent Labs  Lab 01/23/18 1147 01/23/18 1712 01/23/18 2121 01/24/18 0800 01/24/18 1151  GLUCAP 87 126* 97 79 92   Lipid Profile: No results for input(s): CHOL, HDL, LDLCALC, TRIG, CHOLHDL, LDLDIRECT in the last 72 hours. Thyroid Function Tests: Recent Labs    01/22/18 0129  TSH 3.075   Anemia Panel: No results for input(s): VITAMINB12, FOLATE, FERRITIN, TIBC, IRON, RETICCTPCT in the last 72 hours. Sepsis Labs: No results for input(s): PROCALCITON,  LATICACIDVEN in the last 168 hours.  Recent Results (from the past 240 hour(s))  MRSA PCR Screening     Status: None   Collection Time: 01/18/18 12:20 AM  Result Value Ref Range Status   MRSA by PCR NEGATIVE NEGATIVE Final    Comment:        The GeneXpert MRSA Assay (FDA approved for NASAL specimens only), is one component of a comprehensive MRSA colonization surveillance program. It is not intended to diagnose MRSA infection nor to guide or monitor treatment for MRSA infections. Performed at [facility], [address] [place], [place] [address]      Radiology Studies: No results found.  Scheduled Meds: . amitriptyline  10 mg Oral QHS  . apixaban  5 mg Oral BID  . atorvastatin  40 mg Oral QPM  . furosemide  80 mg Intravenous BID  . HYDROcodone-acetaminophen  2 tablet Oral Once  . oxyCODONE-acetaminophen  1 tablet Oral Q12H   And  . oxyCODONE  5 mg Oral BID  . pantoprazole  40 mg Oral Daily  . pregabalin  25 mg Oral BID  . sodium chloride flush  3 mL Intravenous Q12H  . spironolactone  12.5  mg Oral Daily   Continuous Infusions:   LOS: 6 days   Rickey Barbara, MD [facility] Pager On Amion  If 7PM-7AM, please contact night-coverage 01/24/2018, 3:03 PM

## 2018-01-25 LAB — BASIC METABOLIC PANEL
Anion gap: 12 (ref 5–15)
BUN: 18 mg/dL (ref 8–23)
CO2: 30 mmol/L (ref 22–32)
Calcium: 8.7 mg/dL — ABNORMAL LOW (ref 8.9–10.3)
Chloride: 100 mmol/L (ref 98–111)
Creatinine, Ser: 0.75 mg/dL (ref 0.44–1.00)
GFR calc Af Amer: >60 mL/min (ref >60)
GFR calc non Af Amer: >60 mL/min (ref >60)
Glucose, Bld: 88 mg/dL (ref 70–99)
Potassium: 3.5 mmol/L (ref 3.5–5.1)
Sodium: 142 mmol/L (ref 135–145)

## 2018-01-25 LAB — GLUCOSE, CAPILLARY
Glucose-Capillary: 89 mg/dL (ref 70–99)
Glucose-Capillary: 91 mg/dL (ref 70–99)
Glucose-Capillary: 98 mg/dL (ref 70–99)
Glucose-Capillary: 96 mg/dL (ref 70–99)

## 2018-01-25 MED ORDER — LACTULOSE 10 GM/15ML PO SOLN
10.0000 g | ORAL | Status: AC
  Administered 2018-01-25: 10 g via ORAL
  Filled 2018-01-25: qty 15

## 2018-01-25 NOTE — Progress Notes (Signed)
   Progress Note  Patient Name: Margaret RobesDorothy Collier Date of Encounter: 01/25/2018  Primary Cardiologist: [facility]  Subjective   No CP or dyspnea  Inpatient Medications    Scheduled Meds: . amitriptyline  10 mg Oral QHS  . apixaban  5 mg Oral BID  . atorvastatin  40 mg Oral QPM  . furosemide  80 mg Intravenous BID  . HYDROcodone-acetaminophen  2 tablet Oral Once  . oxyCODONE-acetaminophen  1 tablet Oral Q12H   And  . oxyCODONE  5 mg Oral BID  . pantoprazole  40 mg Oral Daily  . pregabalin  25 mg Oral BID  . sodium chloride flush  3 mL Intravenous Q12H  . spironolactone  12.5 mg Oral Daily   Continuous Infusions:  PRN Meds: acetaminophen **OR** acetaminophen, albuterol, polyethylene glycol   Vital Signs    Vitals:   01/24/18 2325 01/25/18 0329 01/25/18 0500 01/25/18 0825  BP: (!) 115/97 (!) 107/43  (!) 115/43  Pulse: (!) 55 (!) 48  (!) 38  Resp: 16 13  15   Temp: 97.7 F (36.5 C) 97.7 F (36.5 C)  98.1 F (36.7 C)  TempSrc: Oral Oral  Oral  SpO2: 98% 100%  91%  Weight:   86.9 kg   Height:        Intake/Output Summary (Last 24 hours) at 01/25/2018 1120 Last data filed at 01/24/2018 2329 Gross per 24 hour  Intake 450 ml  Output 2300 ml  Net -1850 ml   Filed Weights   01/22/18 0458 01/24/18 0459 01/25/18 0500  Weight: 90 kg 86.4 kg 86.9 kg    Telemetry    Atrial fibrillation - Personally Reviewed   Physical Exam   GEN: No acute distress.  Frail Neck: No JVD Cardiac: irregular Respiratory: Clear to auscultation bilaterally. GI: Soft, nontender, non-distended  MS: No edema Neuro:   Left hemiparesis Psych: Normal affect   Labs    Chemistry Recent Labs  Lab 01/23/18 0201 01/24/18 0318 01/25/18 0126  NA 143 142 142  K 3.4* 3.8 3.5  CL 101 100 100  CO2 30 30 30   GLUCOSE 102* 93 88  BUN 16 18 18   CREATININE 0.64 0.80 0.75  CALCIUM 8.6* 8.9 8.7*  GFRNONAA >60 >60 >60  GFRAA >60 >60 >60  ANIONGAP 12 12 12      Patient Profile     84 y.o. female with severe L hemiparesis following ischemic CVA, persistentatrial fibrillationwith slow ventricular response and acute on chronic diastolic HF with mostly R heart failure findings.   Assessment & Plan    1 acute on chronic diastolic congestive heart failure-I/O-2350. Wt 86.9 kg.  Volume status appears to be improving.  She still remains 10 pounds above dry weight.  Continue Lasix at present dose.  Follow renal function.  2 Atrial fibrillation-patient remains in atrial fibrillation.  She is bradycardic but predominantly at night.  No plans for pacemaker.  Continue apixaban.  3 Prior CVA-residual left hemiparesis.  Continue apixaban.  4 ICA stenosis-continue statin.  For questions or updates, please contact [facility] Please consult [email or web address] for contact info under        Signed, Olga MillersBrian Crenshaw, MD  01/25/2018, 11:20 AM

## 2018-01-25 NOTE — Progress Notes (Signed)
Pt placed on CPAP for the night with 2lpm oxygen- Pt tolerating well with nasal mask.

## 2018-01-25 NOTE — Progress Notes (Signed)
PROGRESS NOTE    Margaret Collier  ZOX:[id number] DOB: 10-01-1931 DOA: 01/17/2018 PCP: System, Pcp Not In    Brief Narrative:   84 y.o. year old female with medical history significant for CVA with residual left-sided hemiparesis, atrial fibrillation on Eliquis, hypertension, hyperlipidemia, reported OSA not on CPAP who presented from her facility on 01/17/2018 with several days of progressive shortness of breath, lower leg swelling and was found to have acute hypoxic respiratory failure presumed secondary to CHF with preserved EF exacerbation.  Patient states for the past several days she hass noticed worsening dyspnea and lower leg swelling that she is treated intermittently with her home Lasix therapy (40 mg daily).  On ED evaluation patient had nonischemic EKG, negative troponin,.  Lab work-up notable for BNP of 295, hemoglobin of 11, chest x-ray showing cardiomegaly with possible small left pleural effusions. In the ED requiring submental oxygen with improvement.  Given IV Lasix 80 mg x 1 and admitted to hospitalist service for presumed CHF exacerbation.  Assessment & Plan:   Principal Problem:   Acute exacerbation of CHF (congestive heart failure) (HCC) Active Problems:   Atrial fibrillation (HCC)   Hypertension   Hyperlipidemia   OSA (obstructive sleep apnea)   History of CVA (cerebrovascular accident) without residual deficits   Bradycardia  #Bradycardia, slow ventricular response in patient with atrial fibrillation. Not on BB at home or here.  -Cardilogy following -Noted to be worse at night. Pt with known OSA, refusing CPAP, suspect leading to worsening bradycardia at night. -HR remains stable, asymptomatic, monitoring on tele. HR in the 50's when seen today  #Acute hypoxic respiratory failure presumed secondary to CHF exacerbation with preserved EF, improving.  Cardiology following. Continuing to diurese. Negative 9.7 L this admission. -Wt remains largely unchanged. No  BM in 7 days. Question additional wt in form of stool. See below -previous dry wt noted to be 78kg -Renal function remains stable -Continue to monitor I/o. Repeat bmet in AM -Spironolactone added per Cardiology -Continue IV lasix as per Cardiology recs  #Hypertension, currently at goal, controlled. Had been continued on norvasc, now on hold secondary to soft BP -BP currently within normal range  #Paroxysmal Atrial fibrillation, currently rate controlled.  Continue Eliquis. Without evidence of bleeding  #Hyperlipidemia, stable continue home atorvastatin. Stable at this time  #History of nonhemorrhagic CVA (09/2017) with residual left-sided paralysis and left facial droop -No acute focal deficits, continue pregabalin, PRN home oxycodone, amitriptyline for neurologic pain control -Presently stable  #Hypokalemia, slightly worse.   -Suspect secondary to diuresis -Currently stable Will repeat bmet in AM  #OSA not on CPAP -Continue to encourage CPAP as tolerated  #Constipation -No significant bowel movement in nearly 7 days -Will give tral of lactulose  DVT prophylaxis: Eliquis Code Status: Full Family Communication: Pt in room, family not at bedside Disposition Plan: Uncertain at this time  Consultants:   Cardiology  Procedures:     Antimicrobials: Anti-infectives (From admission, onward)   None      Subjective: Still with swollen legs. Reports constipation  Objective: Vitals:   01/25/18 0329 01/25/18 0500 01/25/18 0825 01/25/18 1115  BP: (!) 107/43  (!) 115/43 (!) 108/53  Pulse: (!) 48  (!) 38   Resp: 13  15 12   Temp: 97.7 F (36.5 C)  98.1 F (36.7 C) 98 F (36.7 C)  TempSrc: Oral  Oral Oral  SpO2: 100%  91% 95%  Weight:  86.9 kg    Height:  Intake/Output Summary (Last 24 hours) at 01/25/2018 1517 Last data filed at 01/25/2018 1300 Gross per 24 hour  Intake 810 ml  Output 2750 ml  Net -1940 ml   Filed Weights   01/22/18 0458 01/24/18  0459 01/25/18 0500  Weight: 90 kg 86.4 kg 86.9 kg    Examination: General exam: Conversant, in no acute distress Respiratory system: normal chest rise, clear, no audible wheezing Cardiovascular system: regular rhythm, s1-s2 Gastrointestinal system: Mildly distended, nontender, pos BS Central nervous system: No seizures, no tremors Extremities: No cyanosis, no joint deformities, BLE edema, improving Skin: No rashes, no pallor Psychiatry: Affect normal // no auditory hallucinations   Data Reviewed: I have personally reviewed following labs and imaging studies  CBC: No results for input(s): WBC, NEUTROABS, HGB, HCT, MCV, PLT in the last 168 hours. Basic Metabolic Panel: Recent Labs  Lab 01/21/18 0248 01/22/18 0129 01/23/18 0201 01/24/18 0318 01/25/18 0126  NA 140 143 143 142 142  K 3.4* 4.0 3.4* 3.8 3.5  CL 98 100 101 100 100  CO2 31 31 30 30 30   GLUCOSE 102* 105* 102* 93 88  BUN 14 17 16 18 18   CREATININE 0.84 0.74 0.64 0.80 0.75  CALCIUM 8.7* 8.7* 8.6* 8.9 8.7*  MG 1.9  --   --   --   --    GFR: Estimated Creatinine Clearance: 53.6 mL/min (by C-G formula based on SCr of 0.75 mg/dL). Liver Function Tests: No results for input(s): AST, ALT, ALKPHOS, BILITOT, PROT, ALBUMIN in the last 168 hours. No results for input(s): LIPASE, AMYLASE in the last 168 hours. No results for input(s): AMMONIA in the last 168 hours. Coagulation Profile: No results for input(s): INR, PROTIME in the last 168 hours. Cardiac Enzymes: No results for input(s): CKTOTAL, CKMB, CKMBINDEX, TROPONINI in the last 168 hours. BNP (last 3 results) No results for input(s): PROBNP in the last 8760 hours. HbA1C: No results for input(s): HGBA1C in the last 72 hours. CBG: Recent Labs  Lab 01/24/18 1151 01/24/18 1654 01/24/18 2131 01/25/18 0831 01/25/18 1204  GLUCAP 92 80 113* 89 91   Lipid Profile: No results for input(s): CHOL, HDL, LDLCALC, TRIG, CHOLHDL, LDLDIRECT in the last 72 hours. Thyroid  Function Tests: No results for input(s): TSH, T4TOTAL, FREET4, T3FREE, THYROIDAB in the last 72 hours. Anemia Panel: No results for input(s): VITAMINB12, FOLATE, FERRITIN, TIBC, IRON, RETICCTPCT in the last 72 hours. Sepsis Labs: No results for input(s): PROCALCITON, LATICACIDVEN in the last 168 hours.  Recent Results (from the past 240 hour(s))  MRSA PCR Screening     Status: None   Collection Time: 01/18/18 12:20 AM  Result Value Ref Range Status   MRSA by PCR NEGATIVE NEGATIVE Final    Comment:        The GeneXpert MRSA Assay (FDA approved for NASAL specimens only), is one component of a comprehensive MRSA colonization surveillance program. It is not intended to diagnose MRSA infection nor to guide or monitor treatment for MRSA infections. Performed at [facility], [address] [place], [place] [address]      Radiology Studies: No results found.  Scheduled Meds: . amitriptyline  10 mg Oral QHS  . apixaban  5 mg Oral BID  . atorvastatin  40 mg Oral QPM  . furosemide  80 mg Intravenous BID  . HYDROcodone-acetaminophen  2 tablet Oral Once  . oxyCODONE-acetaminophen  1 tablet Oral Q12H   And  . oxyCODONE  5 mg Oral BID  .  pantoprazole  40 mg Oral Daily  . pregabalin  25 mg Oral BID  . sodium chloride flush  3 mL Intravenous Q12H  . spironolactone  12.5 mg Oral Daily   Continuous Infusions:   LOS: 7 days   Rickey BarbaraStephen Chiu, MD [facility] Pager On Amion  If 7PM-7AM, please contact night-coverage 01/25/2018, 3:17 PM

## 2018-01-26 LAB — BASIC METABOLIC PANEL
Anion gap: 17 — ABNORMAL HIGH (ref 5–15)
BUN: 18 mg/dL (ref 8–23)
CALCIUM: 8.9 mg/dL (ref 8.9–10.3)
CO2: 27 mmol/L (ref 22–32)
Chloride: 98 mmol/L (ref 98–111)
Creatinine, Ser: 0.99 mg/dL (ref 0.44–1.00)
GFR calc Af Amer: >60 mL/min (ref >60)
GFR calc non Af Amer: 52 mL/min — ABNORMAL LOW (ref >60)
Glucose, Bld: 95 mg/dL (ref 70–99)
Potassium: 3.3 mmol/L — ABNORMAL LOW (ref 3.5–5.1)
Sodium: 142 mmol/L (ref 135–145)

## 2018-01-26 LAB — GLUCOSE, CAPILLARY
Glucose-Capillary: 82 mg/dL (ref 70–99)
Glucose-Capillary: 99 mg/dL (ref 70–99)
Glucose-Capillary: 102 mg/dL — ABNORMAL HIGH (ref 70–99)
Glucose-Capillary: 109 mg/dL — ABNORMAL HIGH (ref 70–99)

## 2018-01-26 MED ORDER — POTASSIUM CHLORIDE 20 MEQ/15ML (10%) PO SOLN
40.0000 meq | Freq: Once | ORAL | Status: AC
  Administered 2018-01-26: 40 meq via ORAL
  Filled 2018-01-26: qty 30

## 2018-01-26 MED ORDER — POTASSIUM CHLORIDE CRYS ER 20 MEQ PO TBCR: 40.0000 meq | EXTENDED_RELEASE_TABLET | Freq: Once | ORAL | Status: DC

## 2018-01-26 MED ORDER — MAGNESIUM CITRATE PO SOLN
1.0000 | Freq: Once | ORAL | Status: DC
  Filled 2018-01-26: qty 296

## 2018-01-26 NOTE — Progress Notes (Signed)
PROGRESS NOTE    Margaret RobesDorothy Collier  WUJ:[id number] DOB: 1931/02/26 DOA: 01/17/2018 PCP: System, Pcp Not In    Brief Narrative:   84 y.o. year old female with medical history significant for CVA with residual left-sided hemiparesis, atrial fibrillation on Eliquis, hypertension, hyperlipidemia, reported OSA not on CPAP who presented from her facility on 01/17/2018 with several days of progressive shortness of breath, lower leg swelling and was found to have acute hypoxic respiratory failure presumed secondary to CHF with preserved EF exacerbation.  Patient states for the past several days she hass noticed worsening dyspnea and lower leg swelling that she is treated intermittently with her home Lasix therapy (40 mg daily).  On ED evaluation patient had nonischemic EKG, negative troponin,.  Lab work-up notable for BNP of 295, hemoglobin of 11, chest x-ray showing cardiomegaly with possible small left pleural effusions. In the ED requiring submental oxygen with improvement.  Given IV Lasix 80 mg x 1 and admitted to hospitalist service for presumed CHF exacerbation.  Assessment & Plan:   Principal Problem:   Acute exacerbation of CHF (congestive heart failure) (HCC) Active Problems:   Atrial fibrillation (HCC)   Hypertension   Hyperlipidemia   OSA (obstructive sleep apnea)   History of CVA (cerebrovascular accident) without residual deficits   Bradycardia  #Bradycardia, slow ventricular response in patient with atrial fibrillation. Not on BB at home or here.  -Cardilogy following -Noted to be worse at night. Pt with known OSA, refusing CPAP, suspect leading to worsening bradycardia at night. -HR remains stable, asymptomatic, monitoring on tele. HR remains stable at this time  #Acute hypoxic respiratory failure presumed secondary to CHF exacerbation with preserved EF, improving.  Cardiology following. Continuing to diurese. Negative 9.7 L this admission. -Wt remains largely unchanged. No  BM in 7 days. Question additional wt in form of stool. See below -previous dry wt noted to be 78kg -Renal function remains stable -Continue to monitor I/o. Repeat bmet in AM -Spironolactone added per Cardiology -Continue IV lasix as per Cardiology recs -Possible transition to PO tomorrow per Cardiolgy   #Hypertension, currently at goal, controlled. Had been continued on norvasc, now on hold secondary to soft BP -BP remains stable at this time  #Paroxysmal Atrial fibrillation, currently rate controlled.  Continue Eliquis. Without evidence of bleeding  #Hyperlipidemia, stable continue home atorvastatin. Stable at this time  #History of nonhemorrhagic CVA (09/2017) with residual left-sided paralysis and left facial droop -No acute focal deficits, continue pregabalin, PRN home oxycodone, amitriptyline for neurologic pain control -Remains stable  #Hypokalemia, slightly worse.   -Suspect secondary to diuresis -Currently stable Will repeat bmet in AM  #OSA not on CPAP -Continue to encourage CPAP as tolerated  #Constipation -No significant bowel movement in almost one week -no results with lactulose -Give tiral of mg citrate  DVT prophylaxis: Eliquis Code Status: Full Family Communication: Pt in room, family not at bedside Disposition Plan: Uncertain at this time  Consultants:   Cardiology  Procedures:     Antimicrobials: Anti-infectives (From admission, onward)   None      Subjective: Still feeling constipated  Objective: Vitals:   01/26/18 0746 01/26/18 1030 01/26/18 1142 01/26/18 1146  BP: (!) 113/42   (!) 109/40  Pulse: (!) 54 63 (!) 52 (!) 58  Resp: 19 15 19 12   Temp: 97.9 F (36.6 C)  (!) 97.5 F (36.4 C)   TempSrc: Oral  Oral   SpO2: 97% 96% 93% (!) 89%  Weight:  Height:        Intake/Output Summary (Last 24 hours) at 01/26/2018 1544 Last data filed at 01/26/2018 1323 Gross per 24 hour  Intake 1090 ml  Output 3450 ml  Net -2360 ml    Filed Weights   01/24/18 0459 01/25/18 0500 01/26/18 0625  Weight: 86.4 kg 86.9 kg 84.1 kg    Examination: General exam: Awake, laying in bed, in nad Respiratory system: Normal respiratory effort, no wheezing Cardiovascular system: regular rate, s1, s2 Gastrointestinal system: Soft, nondistended, positive BS Central nervous system: CN2-12 grossly intact, strength intact Extremities: Perfused, no clubbing, BLE edema Skin: Normal skin turgor, no notable skin lesions seen Psychiatry: Mood normal // no visual hallucinations   Data Reviewed: I have personally reviewed following labs and imaging studies  CBC: No results for input(s): WBC, NEUTROABS, HGB, HCT, MCV, PLT in the last 168 hours. Basic Metabolic Panel: Recent Labs  Lab 01/21/18 0248 01/22/18 0129 01/23/18 0201 01/24/18 0318 01/25/18 0126 01/26/18 0312  NA 140 143 143 142 142 142  K 3.4* 4.0 3.4* 3.8 3.5 3.3*  CL 98 100 101 100 100 98  CO2 31 31 30 30 30 27   GLUCOSE 102* 105* 102* 93 88 95  BUN 14 17 16 18 18 18   CREATININE 0.84 0.74 0.64 0.80 0.75 0.99  CALCIUM 8.7* 8.7* 8.6* 8.9 8.7* 8.9  MG 1.9  --   --   --   --   --    GFR: Estimated Creatinine Clearance: 42.5 mL/min (by C-G formula based on SCr of 0.99 mg/dL). Liver Function Tests: No results for input(s): AST, ALT, ALKPHOS, BILITOT, PROT, ALBUMIN in the last 168 hours. No results for input(s): LIPASE, AMYLASE in the last 168 hours. No results for input(s): AMMONIA in the last 168 hours. Coagulation Profile: No results for input(s): INR, PROTIME in the last 168 hours. Cardiac Enzymes: No results for input(s): CKTOTAL, CKMB, CKMBINDEX, TROPONINI in the last 168 hours. BNP (last 3 results) No results for input(s): PROBNP in the last 8760 hours. HbA1C: No results for input(s): HGBA1C in the last 72 hours. CBG: Recent Labs  Lab 01/25/18 1204 01/25/18 1629 01/25/18 2155 01/26/18 0801 01/26/18 1200  GLUCAP 91 98 96 82 99   Lipid Profile: No  results for input(s): CHOL, HDL, LDLCALC, TRIG, CHOLHDL, LDLDIRECT in the last 72 hours. Thyroid Function Tests: No results for input(s): TSH, T4TOTAL, FREET4, T3FREE, THYROIDAB in the last 72 hours. Anemia Panel: No results for input(s): VITAMINB12, FOLATE, FERRITIN, TIBC, IRON, RETICCTPCT in the last 72 hours. Sepsis Labs: No results for input(s): PROCALCITON, LATICACIDVEN in the last 168 hours.  Recent Results (from the past 240 hour(s))  MRSA PCR Screening     Status: None   Collection Time: 01/18/18 12:20 AM  Result Value Ref Range Status   MRSA by PCR NEGATIVE NEGATIVE Final    Comment:        The GeneXpert MRSA Assay (FDA approved for NASAL specimens only), is one component of a comprehensive MRSA colonization surveillance program. It is not intended to diagnose MRSA infection nor to guide or monitor treatment for MRSA infections. Performed at [facility], [address], [place], [place] [address]      Radiology Studies: No results found.  Scheduled Meds: . amitriptyline  10 mg Oral QHS  . apixaban  5 mg Oral BID  . atorvastatin  40 mg Oral QPM  . furosemide  80 mg Intravenous BID  . HYDROcodone-acetaminophen  2 tablet Oral  Once  . magnesium citrate  1 Bottle Oral Once  . oxyCODONE-acetaminophen  1 tablet Oral Q12H   And  . oxyCODONE  5 mg Oral BID  . pantoprazole  40 mg Oral Daily  . potassium chloride  40 mEq Oral Once  . pregabalin  25 mg Oral BID  . sodium chloride flush  3 mL Intravenous Q12H  . spironolactone  12.5 mg Oral Daily   Continuous Infusions:   LOS: 8 days   Rickey BarbaraStephen Detria Cummings, MD [facility] Pager On Amion  If 7PM-7AM, please contact night-coverage 01/26/2018, 3:44 PM

## 2018-01-26 NOTE — Progress Notes (Signed)
   Progress Note  Patient Name: Margaret RobesDorothy Collier Date of Encounter: 01/26/2018  Primary Cardiologist[facility]  Subjective   Denies CP or dyspnea  Inpatient Medications    Scheduled Meds: . amitriptyline  10 mg Oral QHS  . apixaban  5 mg Oral BID  . atorvastatin  40 mg Oral QPM  . furosemide  80 mg Intravenous BID  . HYDROcodone-acetaminophen  2 tablet Oral Once  . oxyCODONE-acetaminophen  1 tablet Oral Q12H   And  . oxyCODONE  5 mg Oral BID  . pantoprazole  40 mg Oral Daily  . pregabalin  25 mg Oral BID  . sodium chloride flush  3 mL Intravenous Q12H  . spironolactone  12.5 mg Oral Daily   Continuous Infusions:  PRN Meds: acetaminophen **OR** acetaminophen, albuterol, polyethylene glycol   Vital Signs    Vitals:   01/26/18 0215 01/26/18 0355 01/26/18 0625 01/26/18 0746  BP: 129/66 (!) 116/48  (!) 113/42  Pulse: (!) 48 (!) 56 (!) 56 (!) 54  Resp: 14 15 19 19   Temp: 98.4 F (36.9 C) 97.8 F (36.6 C)  97.9 F (36.6 C)  TempSrc: Axillary Oral  Oral  SpO2: 100% 96% 93% 97%  Weight:   84.1 kg   Height:        Intake/Output Summary (Last 24 hours) at 01/26/2018 1031 Last data filed at 01/26/2018 0824 Gross per 24 hour  Intake 790 ml  Output 2900 ml  Net -2110 ml   Filed Weights   01/24/18 0459 01/25/18 0500 01/26/18 0625  Weight: 86.4 kg 86.9 kg 84.1 kg    Telemetry    Atrial fibrillation with CVR - Personally Reviewed   Physical Exam   GEN: WD frail Neck: supple Cardiac: irregular with normal rate Respiratory: CTA GI: Soft, NT/ND MS: No edema Neuro:   Left hemiparesis; no new findings   Labs    Chemistry Recent Labs  Lab 01/24/18 0318 01/25/18 0126 01/26/18 0312  NA 142 142 142  K 3.8 3.5 3.3*  CL 100 100 98  CO2 30 30 27   GLUCOSE 93 88 95  BUN 18 18 18   CREATININE 0.80 0.75 0.99  CALCIUM 8.9 8.7* 8.9  GFRNONAA >60 >60 52*  GFRAA >60 >60 >60  ANIONGAP 12 12 17*     Patient Profile     84 y.o. female with severe  L hemiparesis following ischemic CVA, persistentatrial fibrillationwith slow ventricular response and acute on chronic diastolic HF with mostly R heart failure findings.   Assessment & Plan    1 acute on chronic diastolic congestive heart failure-I/O-1760 (-11660 since admission). Wt 84.1 kg.  Patient feels much better but not back to baseline.  We are nearing dry weight.  We will continue IV Lasix today.  Likely can transition to oral tomorrow.  Continue fluid restriction and low-sodium diet.  2 Atrial fibrillation-patient remains in atrial fibrillation.  She is bradycardic but predominantly at night.  No plans for pacemaker.  Continue apixaban.  3 Prior CVA-residual left hemiparesis.  Continue apixaban.  4 ICA stenosis-continue statin.  5 hypokalemia-supplement  For questions or updates, please contact [facility] Please consult [email or web address] for contact info under        Signed, Olga MillersBrian Crenshaw, MD  01/26/2018, 10:31 AM

## 2018-01-26 NOTE — Progress Notes (Signed)
Nurses aid told that patient had moderate to large amount of BM yesterday by another Nurses aid. Dr. Rhona Leavenshiu made aware of it and hold magnesium citrus at this time. HS McDonald's CorporationLee RN

## 2018-01-27 DIAGNOSIS — I480 Paroxysmal atrial fibrillation: Secondary | ICD-10-CM

## 2018-01-27 LAB — BASIC METABOLIC PANEL
ANION GAP: 9 (ref 5–15)
BUN: 20 mg/dL (ref 8–23)
CO2: 32 mmol/L (ref 22–32)
Calcium: 8.9 mg/dL (ref 8.9–10.3)
Chloride: 101 mmol/L (ref 98–111)
Creatinine, Ser: 0.85 mg/dL (ref 0.44–1.00)
GFR calc Af Amer: >60 mL/min (ref >60)
GFR calc non Af Amer: >60 mL/min (ref >60)
Glucose, Bld: 90 mg/dL (ref 70–99)
POTASSIUM: 3.7 mmol/L (ref 3.5–5.1)
Sodium: 142 mmol/L (ref 135–145)

## 2018-01-27 LAB — GLUCOSE, CAPILLARY
Glucose-Capillary: 80 mg/dL (ref 70–99)
Glucose-Capillary: 82 mg/dL (ref 70–99)
Glucose-Capillary: 89 mg/dL (ref 70–99)
Glucose-Capillary: 91 mg/dL (ref 70–99)

## 2018-01-27 MED ORDER — FUROSEMIDE 80 MG PO TABS
80.0000 mg | ORAL_TABLET | Freq: Two times a day (BID) | ORAL | Status: DC
  Administered 2018-01-27 – 2018-01-30 (×6): 80 mg via ORAL
  Filled 2018-01-27 (×6): qty 1

## 2018-01-27 NOTE — Progress Notes (Signed)
Progress Note  Patient Name: Margaret Collier Date of Encounter: 01/27/2018  Primary Cardiologist: [facility]  Subjective   She feels better today and denies any chest pain or shortness of breath.  Inpatient Medications    Scheduled Meds: . amitriptyline  10 mg Oral QHS  . apixaban  5 mg Oral BID  . atorvastatin  40 mg Oral QPM  . furosemide  80 mg Intravenous BID  . HYDROcodone-acetaminophen  2 tablet Oral Once  . magnesium citrate  1 Bottle Oral Once  . oxyCODONE-acetaminophen  1 tablet Oral Q12H   And  . oxyCODONE  5 mg Oral BID  . pantoprazole  40 mg Oral Daily  . pregabalin  25 mg Oral BID  . sodium chloride flush  3 mL Intravenous Q12H  . spironolactone  12.5 mg Oral Daily   Continuous Infusions:  PRN Meds: acetaminophen **OR** acetaminophen, albuterol, polyethylene glycol   Vital Signs    Vitals:   01/26/18 1645 01/26/18 1944 01/27/18 0500 01/27/18 0741  BP: (!) 123/52 (!) 114/54  (!) 116/42  Pulse: (!) 58 65    Resp: 16 17    Temp: 97.9 F (36.6 C) 97.7 F (36.5 C)  (!) 97.4 F (36.3 C)  TempSrc: Oral Oral  Oral  SpO2: 98% 98%  94%  Weight:   86.1 kg   Height:        Intake/Output Summary (Last 24 hours) at 01/27/2018 0939 Last data filed at 01/26/2018 2202 Gross per 24 hour  Intake 320 ml  Output 2000 ml  Net -1680 ml   Filed Weights   01/25/18 0500 01/26/18 0625 01/27/18 0500  Weight: 86.9 kg 84.1 kg 86.1 kg   Telemetry    Atrial fibrillation with CVR - Personally Reviewed  Physical Exam   GEN: WD frail Neck: supple Cardiac: irregular with normal rate Respiratory: CTA GI: Soft, NT/ND MS: No edema Neuro:   Left hemiparesis; no new findings   Labs    Chemistry Recent Labs  Lab 01/25/18 0126 01/26/18 0312 01/27/18 0225  NA 142 142 142  K 3.5 3.3* 3.7  CL 100 98 101  CO2 30 27 32  GLUCOSE 88 95 90  BUN 18 18 20   CREATININE 0.75 0.99 0.85  CALCIUM 8.7* 8.9 8.9  GFRNONAA >60 52* >60  GFRAA >60 >60 >60    ANIONGAP 12 17* 9     Patient Profile     84 y.o. female with severe L hemiparesis following ischemic CVA, persistentatrial fibrillationwith slow ventricular response and acute on chronic diastolic HF with mostly R heart failure findings.   Assessment & Plan    1 acute on chronic diastolic congestive heart failure-I/O-1680 (-13 L since admission). Wt 86 kg.  Patient feels much better but not back to baseline. She is euvolemic, will switch to oral Lasix today, I would start 80 in the morning and 40 in the afternoon milligrams p.o., previously she was only on 40 daily at home with no spironolactone.  Continue fluid restriction and low-sodium diet.  She appears deconditioned, I will start physical therapy today.  2 Atrial fibrillation-patient remains in atrial fibrillation.  She is bradycardic but predominantly at night.  No plans for pacemaker.  Continue apixaban.  3 Prior CVA-residual left hemiparesis.  Continue apixaban.  4 ICA stenosis-continue statin.  5 hypokalemia-supplement  For questions or updates, please contact [facility] Please consult [email or web address] for contact info under     Signed, Tobias AlexanderKatarina Jahmeir Geisen, MD  01/27/2018, 9:39  AM

## 2018-01-27 NOTE — Progress Notes (Signed)
PT Cancellation Note/Screen  Patient Details Name: Margaret RobesDorothy Tench MRN: [id number] DOB: [birth year]   Cancelled Treatment:    Reason Eval/Treat Not Completed: Other (comment).  We appreciate the PT order, however, pt is dependent in all of her care and mobility at baseline.  She is not appropriate to be seen acutely.  See PT note from 01/24/18  She is appropriate for return to SNF at discharge.    Thanks, Rollene Rotundaebecca B. Temeca Somma, PT, DPT  Acute Rehabilitation [phone] pager [phone] office      Lurena JoinerRebecca B Patrizia Paule 01/27/2018, 12:18 PM

## 2018-01-27 NOTE — Progress Notes (Addendum)
PROGRESS NOTE    Margaret Collier  ZOX:[id number] DOB: 1931/05/31 DOA: 01/17/2018 PCP: System, Pcp Not In    Brief Narrative:   84 y.o. year old female with medical history significant for CVA with residual left-sided hemiparesis, atrial fibrillation on Eliquis, hypertension, hyperlipidemia, reported OSA not on CPAP who presented from her facility on 01/17/2018 with several days of progressive shortness of breath, lower leg swelling and was found to have acute hypoxic respiratory failure presumed secondary to CHF with preserved EF exacerbation.  Patient states for the past several days she hass noticed worsening dyspnea and lower leg swelling that she is treated intermittently with her home Lasix therapy (40 mg daily).  On ED evaluation patient had nonischemic EKG, negative troponin,.  Lab work-up notable for BNP of 295, hemoglobin of 11, chest x-ray showing cardiomegaly with possible small left pleural effusions. In the ED requiring submental oxygen with improvement.  Given IV Lasix 80 mg x 1 and admitted to hospitalist service for presumed CHF exacerbation.  Assessment & Plan:   Principal Problem:   Acute exacerbation of CHF (congestive heart failure) (HCC) Active Problems:   Atrial fibrillation (HCC)   Hypertension   Hyperlipidemia   OSA (obstructive sleep apnea)   History of CVA (cerebrovascular accident) without residual deficits   Bradycardia  #Bradycardia, slow ventricular response in patient with atrial fibrillation. Not on BB at home or here.  -Cardilogy following -Noted to be worse at night. Pt with known OSA, refusing CPAP, suspect leading to worsening bradycardia at night. -HR remains stable, asymptomatic, monitoring on tele. HR remains stable at this time  #Acute hypoxic respiratory failure presumed secondary to CHF exacerbation with preserved EF, improving.  Cardiology following. Continuing to diurese. Negative 9.7 L this admission. -Wt remains largely unchanged. No  BM in 7 days. Question additional wt in form of stool. See below -previous dry wt noted to be 78kg -Renal function remains stable -Continue to monitor I/o. Repeat bmet in AM -Spironolactone added per Cardiology -Lasix transitioned to PO today. Will repeat bmet in AM  #Hypertension, currently at goal, controlled. Had been continued on norvasc, now on hold secondary to soft BP -BP stable currently   #Paroxysmal Atrial fibrillation, currently rate controlled.  Continue Eliquis. Without evidence of bleeding  #Hyperlipidemia, stable continue home atorvastatin. Stable at this time  #History of nonhemorrhagic CVA (09/2017) with residual left-sided paralysis and left facial droop -No acute focal deficits, continue pregabalin, PRN home oxycodone, amitriptyline for neurologic pain control -Remains stable  #Hypokalemia, slightly worse.   -Suspect secondary to diuresis -Currently stable Will repeat bmet in AM  #OSA not on CPAP -Continue to encourage CPAP as tolerated  #Constipation -No significant bowel movement in almost one week -no results with lactulose -Give tiral of mg citrate  DVT prophylaxis: Eliquis Code Status: Full Family Communication: Pt in room, family not at bedside Disposition Plan: Uncertain at this time  Consultants:   Cardiology  Procedures:     Antimicrobials: Anti-infectives (From admission, onward)   None      Subjective: Without sob. No complaints  Objective: Vitals:   01/26/18 1944 01/27/18 0500 01/27/18 0741 01/27/18 1119  BP: (!) 114/54  (!) 116/42 (!) 110/51  Pulse: 65   (!) 58  Resp: 17   14  Temp: 97.7 F (36.5 C)  (!) 97.4 F (36.3 C) 98 F (36.7 C)  TempSrc: Oral  Oral Oral  SpO2: 98%  94% 93%  Weight:  86.1 kg    Height:  Intake/Output Summary (Last 24 hours) at 01/27/2018 1627 Last data filed at 01/27/2018 1217 Gross per 24 hour  Intake 850 ml  Output 1000 ml  Net -150 ml   Filed Weights   01/25/18 0500  01/26/18 0625 01/27/18 0500  Weight: 86.9 kg 84.1 kg 86.1 kg    Examination: General exam: Awake, laying in bed, in nad Respiratory system: Normal respiratory effort, no wheezing Cardiovascular system: regular rate, s1, s2  Data Reviewed: I have personally reviewed following labs and imaging studies  CBC: No results for input(s): WBC, NEUTROABS, HGB, HCT, MCV, PLT in the last 168 hours. Basic Metabolic Panel: Recent Labs  Lab 01/21/18 0248  01/23/18 0201 01/24/18 0318 01/25/18 0126 01/26/18 0312 01/27/18 0225  NA 140   < > 143 142 142 142 142  K 3.4*   < > 3.4* 3.8 3.5 3.3* 3.7  CL 98   < > 101 100 100 98 101  CO2 31   < > 30 30 30 27  32  GLUCOSE 102*   < > 102* 93 88 95 90  BUN 14   < > 16 18 18 18 20   CREATININE 0.84   < > 0.64 0.80 0.75 0.99 0.85  CALCIUM 8.7*   < > 8.6* 8.9 8.7* 8.9 8.9  MG 1.9  --   --   --   --   --   --    < > = values in this interval not displayed.   GFR: Estimated Creatinine Clearance: 50.2 mL/min (by C-G formula based on SCr of 0.85 mg/dL). Liver Function Tests: No results for input(s): AST, ALT, ALKPHOS, BILITOT, PROT, ALBUMIN in the last 168 hours. No results for input(s): LIPASE, AMYLASE in the last 168 hours. No results for input(s): AMMONIA in the last 168 hours. Coagulation Profile: No results for input(s): INR, PROTIME in the last 168 hours. Cardiac Enzymes: No results for input(s): CKTOTAL, CKMB, CKMBINDEX, TROPONINI in the last 168 hours. BNP (last 3 results) No results for input(s): PROBNP in the last 8760 hours. HbA1C: No results for input(s): HGBA1C in the last 72 hours. CBG: Recent Labs  Lab 01/26/18 1640 01/26/18 2149 01/27/18 0019 01/27/18 0746 01/27/18 1154  GLUCAP 102* 109* 91 80 89   Lipid Profile: No results for input(s): CHOL, HDL, LDLCALC, TRIG, CHOLHDL, LDLDIRECT in the last 72 hours. Thyroid Function Tests: No results for input(s): TSH, T4TOTAL, FREET4, T3FREE, THYROIDAB in the last 72 hours. Anemia  Panel: No results for input(s): VITAMINB12, FOLATE, FERRITIN, TIBC, IRON, RETICCTPCT in the last 72 hours. Sepsis Labs: No results for input(s): PROCALCITON, LATICACIDVEN in the last 168 hours.  Recent Results (from the past 240 hour(s))  MRSA PCR Screening     Status: None   Collection Time: 01/18/18 12:20 AM  Result Value Ref Range Status   MRSA by PCR NEGATIVE NEGATIVE Final    Comment:        The GeneXpert MRSA Assay (FDA approved for NASAL specimens only), is one component of a comprehensive MRSA colonization surveillance program. It is not intended to diagnose MRSA infection nor to guide or monitor treatment for MRSA infections. Performed at [facility], [address] [place], [place] [address]      Radiology Studies: No results found.  Scheduled Meds: . amitriptyline  10 mg Oral QHS  . apixaban  5 mg Oral BID  . atorvastatin  40 mg Oral QPM  . furosemide  80 mg Oral BID  . HYDROcodone-acetaminophen  2  tablet Oral Once  . magnesium citrate  1 Bottle Oral Once  . oxyCODONE-acetaminophen  1 tablet Oral Q12H   And  . oxyCODONE  5 mg Oral BID  . pantoprazole  40 mg Oral Daily  . pregabalin  25 mg Oral BID  . sodium chloride flush  3 mL Intravenous Q12H  . spironolactone  12.5 mg Oral Daily   Continuous Infusions:   LOS: 9 days   Rickey BarbaraStephen Chiu, MD [facility] Pager On Amion  If 7PM-7AM, please contact night-coverage 01/27/2018, 4:27 PM

## 2018-01-27 NOTE — Clinical Social Work Note (Signed)
CSW continues to follow for discharge needs. Per MD, patient may be ready tomorrow. CSW notified SNF admissions coordinator.  Charlynn CourtSarah Jaleeya Mcnelly, CSW [phone]

## 2018-01-28 DIAGNOSIS — I482 Chronic atrial fibrillation, unspecified: Secondary | ICD-10-CM

## 2018-01-28 LAB — BASIC METABOLIC PANEL
Anion gap: 13 (ref 5–15)
BUN: 21 mg/dL (ref 8–23)
CO2: 27 mmol/L (ref 22–32)
Calcium: 8.9 mg/dL (ref 8.9–10.3)
Chloride: 100 mmol/L (ref 98–111)
Creatinine, Ser: 0.73 mg/dL (ref 0.44–1.00)
GFR calc Af Amer: >60 mL/min (ref >60)
GFR calc non Af Amer: >60 mL/min (ref >60)
Glucose, Bld: 95 mg/dL (ref 70–99)
Potassium: 3.7 mmol/L (ref 3.5–5.1)
Sodium: 140 mmol/L (ref 135–145)

## 2018-01-28 MED ORDER — FUROSEMIDE 40 MG PO TABS: 40.0000 mg | ORAL_TABLET | Freq: Every day | ORAL | 0 refills | Status: DC

## 2018-01-28 MED ORDER — FUROSEMIDE 80 MG PO TABS: 80.0000 mg | ORAL_TABLET | Freq: Every day | ORAL | 0 refills | Status: DC

## 2018-01-28 MED ORDER — POLYETHYLENE GLYCOL 3350 17 G PO PACK: 17.0000 g | PACK | Freq: Every day | ORAL | 0 refills | Status: AC | PRN

## 2018-01-28 MED ORDER — OXYCODONE-ACETAMINOPHEN 10-325 MG PO TABS: 1.0000 | ORAL_TABLET | Freq: Two times a day (BID) | ORAL | 0 refills | Status: DC

## 2018-01-28 MED ORDER — SPIRONOLACTONE 25 MG PO TABS: 12.5000 mg | ORAL_TABLET | Freq: Every day | ORAL | 0 refills | Status: AC

## 2018-01-28 MED ORDER — APIXABAN 5 MG PO TABS: 5.0000 mg | ORAL_TABLET | Freq: Two times a day (BID) | ORAL | 0 refills | Status: AC

## 2018-01-28 NOTE — Progress Notes (Addendum)
Clinical Social Worker following patient for support and discharge needs. CSW spoke with [facility] and they stated they are able to take patient back at facility as long as discharge summary is in by 12. Admission coordinator stated if summary is not finished by 12 they will not accept patient back due to there pharmacy being closed. Tresa EndoKelly stated that because  summary was not received by the time requested patient is unable to come back to facility until Thursday December 26. Facility stated that family will be unable to bring patients medication from a different  pharmacy because it needs to come from the facilities pyxis so they are able to administer it.  MD and RNCM aware.    Marrianne MoodAshley Oddis Westling, MSW,  [employer] [phone]

## 2018-01-28 NOTE — Discharge Summary (Signed)
Physician Discharge Summary  Margaret Collier ZOX:[id number] DOB: Oct 22, 1931 DOA: 01/17/2018  PCP: System, Pcp Not In  Admit date: 01/17/2018 Discharge date: 01/28/2018  Admitted From: SNF Disposition:  SNF  Recommendations for Outpatient Follow-up:  1. Follow up with PCP in 1-2 weeks 2. Follow up bmet in one week, to be followed up by PCP  Wt on discharge: 81.9kg  Discharge Condition:Improved CODE STATUS:Full Diet recommendation: Heart healthy, low sodium   Brief/Interim Summary: 84 y.o.year old femalewith medical history significant for CVA with residual left-sided hemiparesis, atrial fibrillation on Eliquis, hypertension, hyperlipidemia, reported OSA not on CPAP who presented from her facility on 12/13/2019with several days of progressive shortness of breath, lower leg swelling and was found to have acute hypoxic respiratory failure presumed secondary to CHF with preserved EF exacerbation.  Patient states for the past several days she hass noticed worsening dyspnea and lower leg swelling that she is treated intermittently with her home Lasix therapy (40 mg daily).  On ED evaluation patient had nonischemic EKG, negative troponin,. Lab work-up notable for BNP of 295, hemoglobin of 11, chest x-ray showing cardiomegaly with possible small left pleural effusions. In the ED requiring submental oxygen with improvement. Given IV Lasix 80 mg x 1 and admitted to hospitalist service for presumed CHF exacerbation.  Discharge Diagnoses:  Principal Problem:   Acute exacerbation of CHF (congestive heart failure) (HCC) Active Problems:   Atrial fibrillation (HCC)   Hypertension   Hyperlipidemia   OSA (obstructive sleep apnea)   History of CVA (cerebrovascular accident) without residual deficits   Bradycardia  #Bradycardia, slow ventricular response in patient with atrial fibrillation. Not on BB at home or here.  -[facility] following -Noted to be worse at night. Pt with known OSA,  refusing CPAP, suspect leading to worsening bradycardia at night. -HR remains stable, asymptomatic, monitoring on tele. HR remains stable at this time  #Acute hypoxic respiratory failure presumed secondary to CHF exacerbation with preserved EF, improving.  Cardiology following. Continuing to diurese. Negative 9.7 L this admission. -Wt remains largely unchanged. No BM in 7 days. Question additional wt in form of stool. See below -previous dry wt noted to be 78kg -Wt on discharge 81.9 -Renal function remains stable -Spironolactone added per Cardiology -Lasix transitioned to PO 80mg  in AM and 40mg  in PM  #Hypertension, currently at goal, controlled. Had been continued on norvasc, now on hold secondary to soft BP -BP stable currently with current regimen  #Paroxysmal Atrial fibrillation, currently rate controlled.  Continued Eliquis. Without evidence of bleeding  #Hyperlipidemia, stable continue home atorvastatin. Stable at this time  #History of nonhemorrhagic CVA (09/2017) with residual left-sided paralysis and left facial droop -No acute focal deficits, continue pregabalin, PRN home oxycodone, amitriptyline for neurologic pain control -Remains stable  #Hypokalemia, slightly worse.   -Suspect secondary to diuresis -Remained stable  #OSA not on CPAP -States not completing sleep study as outpatient -Recommend following up with outpatient sleep study  #Constipation -No significant bowel movement in almost one week -Improved with cathartics    Discharge Instructions   Allergies as of 01/28/2018      Reactions   Chlorthalidone    On MAR   Dilaudid [hydromorphone Hcl]    On MAR   Fosamax [alendronate Sodium]    On MAR   Zanaflex [tizanidine Hcl]    On MAR      Medication List    STOP taking these medications   amLODipine 5 MG tablet Commonly known as:  NORVASC   levofloxacin 500  MG tablet Commonly known as:  LEVAQUIN     TAKE these medications   albuterol  108 (90 Base) MCG/ACT inhaler Commonly known as:  PROVENTIL HFA;VENTOLIN HFA Inhale 2 puffs into the lungs every 4 (four) hours as needed for wheezing or shortness of breath.   amitriptyline 10 MG tablet Commonly known as:  ELAVIL Take 10 mg by mouth at bedtime.   apixaban 5 MG Tabs tablet Commonly known as:  ELIQUIS Take 1 tablet (5 mg total) by mouth 2 (two) times daily. What changed:    medication strength  how much to take   atorvastatin 40 MG tablet Commonly known as:  LIPITOR Take 40 mg by mouth every evening.   b complex vitamins capsule Take 1 capsule by mouth daily.   calcium carbonate 500 MG chewable tablet Commonly known as:  TUMS - dosed in mg elemental calcium Chew 1 tablet by mouth 3 (three) times daily before meals.   chlorhexidine 0.12 % solution Commonly known as:  PERIDEX Use as directed 15 mLs in the mouth or throat 2 (two) times daily.   cholecalciferol 25 MCG (1000 UT) tablet Commonly known as:  VITAMIN D3 Take 1,000 Units by mouth daily.   furosemide 80 MG tablet Commonly known as:  LASIX Take 1 tablet (80 mg total) by mouth daily. What changed:    medication strength  how much to take   furosemide 40 MG tablet Commonly known as:  LASIX Take 1 tablet (40 mg total) by mouth at bedtime. What changed:  You were already taking a medication with the same name, and this prescription was added. Make sure you understand how and when to take each.   ipratropium-albuterol 0.5-2.5 (3) MG/3ML Soln Commonly known as:  DUONEB Take 3 mLs by nebulization 3 (three) times daily. x7 days.   oxyCODONE-acetaminophen 10-325 MG tablet Commonly known as:  PERCOCET Take 1 tablet by mouth every 12 (twelve) hours.   OXYGEN Inhale 2 L/min into the lungs at bedtime.   pantoprazole 40 MG tablet Commonly known as:  PROTONIX Take 40 mg by mouth daily.   polyethylene glycol packet Commonly known as:  MIRALAX / GLYCOLAX Take 17 g by mouth daily as needed for  moderate constipation.   potassium chloride SA 20 MEQ tablet Commonly known as:  K-DUR,KLOR-CON Take 20 mEq by mouth daily.   pregabalin 25 MG capsule Commonly known as:  LYRICA Take 25 mg by mouth 2 (two) times daily.   spironolactone 25 MG tablet Commonly known as:  ALDACTONE Take 0.5 tablets (12.5 mg total) by mouth daily. Start taking on:  January 29, 2018       Contact information for follow-up providers    Azalee CourseMeng, Hao, [place] Follow up on 02/19/2018.   Specialties:  Cardiology, Radiology Why:  Your follow up appointment will be on 02/19/2018 at 2 pm Contact information: [address] [place] [address] [phone]            Contact information for after-discharge care    Destination    [facility] .   Service:  Skilled Nursing Contact information: [address] [place] [id number] [phone]                 Allergies  Allergen Reactions  . Chlorthalidone     On MAR  . Dilaudid [Hydromorphone Hcl]     On MAR  . Fosamax [Alendronate Sodium]     On MAR  . Zanaflex [Tizanidine Hcl]  On MAR    Consultations:  Cardiology  Procedures/Studies: Dg Chest 2 View  Result Date: 01/17/2018 CLINICAL DATA:  Shortness of breath. EXAM: CHEST - 2 VIEW COMPARISON:  None. FINDINGS: Cardiomegaly. Normal pulmonary vascularity. Atherosclerotic calcification of the aortic arch. Possible small left pleural effusion. No consolidation or pneumothorax. Elevation of the right hemidiaphragm. No acute osseous abnormality. IMPRESSION: 1. Cardiomegaly.  Possible small left pleural effusion. Electronically Signed   By: Obie DredgeWilliam T Derry M.D.   On: 01/17/2018 17:45    Subjective: Feels better  Discharge Exam: Vitals:   01/28/18 0726 01/28/18 0800  BP: (!) 117/52   Pulse: (!) 57 (!) 58  Resp: 20 13  Temp: (!) 97.4 F (36.3 C)   SpO2: 100% 93%   Vitals:   01/28/18 0456 01/28/18 0500 01/28/18 0726 01/28/18 0800   BP: (!) 119/55  (!) 117/52   Pulse: (!) 50  (!) 57 (!) 58  Resp: 16  20 13   Temp: 97.8 F (36.6 C)  (!) 97.4 F (36.3 C)   TempSrc: Oral  Oral   SpO2: 94%  100% 93%  Weight:  81.9 kg    Height:        General: Pt is alert, awake, not in acute distress Cardiovascular: RRR, S1/S2 +, no rubs, no gallops Respiratory: CTA bilaterally, no wheezing, no rhonchi Abdominal: Soft, NT, ND, bowel sounds + Extremities: no edema, no cyanosis   The results of significant diagnostics from this hospitalization (including imaging, microbiology, ancillary and laboratory) are listed below for reference.     Microbiology: No results found for this or any previous visit (from the past 240 hour(s)).   Labs: BNP (last 3 results) Recent Labs    01/17/18 1647  BNP 295.6*   Basic Metabolic Panel: Recent Labs  Lab 01/24/18 0318 01/25/18 0126 01/26/18 0312 01/27/18 0225 01/28/18 0212  NA 142 142 142 142 140  K 3.8 3.5 3.3* 3.7 3.7  CL 100 100 98 101 100  CO2 30 30 27  32 27  GLUCOSE 93 88 95 90 95  BUN 18 18 18 20 21   CREATININE 0.80 0.75 0.99 0.85 0.73  CALCIUM 8.9 8.7* 8.9 8.9 8.9   Liver Function Tests: No results for input(s): AST, ALT, ALKPHOS, BILITOT, PROT, ALBUMIN in the last 168 hours. No results for input(s): LIPASE, AMYLASE in the last 168 hours. No results for input(s): AMMONIA in the last 168 hours. CBC: No results for input(s): WBC, NEUTROABS, HGB, HCT, MCV, PLT in the last 168 hours. Cardiac Enzymes: No results for input(s): CKTOTAL, CKMB, CKMBINDEX, TROPONINI in the last 168 hours. BNP: Invalid input(s): POCBNP CBG: Recent Labs  Lab 01/26/18 2149 01/27/18 0019 01/27/18 0746 01/27/18 1154 01/27/18 1637  GLUCAP 109* 91 80 89 82   D-Dimer No results for input(s): DDIMER in the last 72 hours. Hgb A1c No results for input(s): HGBA1C in the last 72 hours. Lipid Profile No results for input(s): CHOL, HDL, LDLCALC, TRIG, CHOLHDL, LDLDIRECT in the last 72  hours. Thyroid function studies No results for input(s): TSH, T4TOTAL, T3FREE, THYROIDAB in the last 72 hours.  Invalid input(s): FREET3 Anemia work up No results for input(s): VITAMINB12, FOLATE, FERRITIN, TIBC, IRON, RETICCTPCT in the last 72 hours. Urinalysis No results found for: COLORURINE, APPEARANCEUR, LABSPEC, PHURINE, GLUCOSEU, HGBUR, BILIRUBINUR, KETONESUR, PROTEINUR, UROBILINOGEN, NITRITE, LEUKOCYTESUR Sepsis Labs Invalid input(s): PROCALCITONIN,  WBC,  LACTICIDVEN Microbiology No results found for this or any previous visit (from the past 240 hour(s)).  Time spent: 30min  SIGNED:  Rickey Barbara, MD  [facility] 01/28/2018, 11:40 AM  If 7PM-7AM, please contact night-coverage

## 2018-01-28 NOTE — Progress Notes (Addendum)
Progress Note  Patient Name: Margaret RobesDorothy Collier Date of Encounter: 01/28/2018  Primary Cardiologist[facility]  Subjective   She feels well, continues to diurese on PO lasix. Complains of left leg pain that is chronic.  Inpatient Medications    Scheduled Meds: . amitriptyline  10 mg Oral QHS  . apixaban  5 mg Oral BID  . atorvastatin  40 mg Oral QPM  . furosemide  80 mg Oral BID  . HYDROcodone-acetaminophen  2 tablet Oral Once  . magnesium citrate  1 Bottle Oral Once  . oxyCODONE-acetaminophen  1 tablet Oral Q12H   And  . oxyCODONE  5 mg Oral BID  . pantoprazole  40 mg Oral Daily  . pregabalin  25 mg Oral BID  . sodium chloride flush  3 mL Intravenous Q12H  . spironolactone  12.5 mg Oral Daily   Continuous Infusions:  PRN Meds: acetaminophen **OR** acetaminophen, albuterol, polyethylene glycol   Vital Signs    Vitals:   01/28/18 0004 01/28/18 0456 01/28/18 0500 01/28/18 0726  BP: (!) 112/53 (!) 119/55  (!) 117/52  Pulse: (!) 59 (!) 50  (!) 57  Resp: 14 16  20   Temp: 97.8 F (36.6 C) 97.8 F (36.6 C)  (!) 97.4 F (36.3 C)  TempSrc: Oral Oral  Oral  SpO2: 91% 94%  100%  Weight:   81.9 kg   Height:        Intake/Output Summary (Last 24 hours) at 01/28/2018 0810 Last data filed at 01/28/2018 0458 Gross per 24 hour  Intake 850 ml  Output 1400 ml  Net -550 ml   Filed Weights   01/26/18 0625 01/27/18 0500 01/28/18 0500  Weight: 84.1 kg 86.1 kg 81.9 kg   Telemetry    Atrial fibrillation with CVR - Personally Reviewed  Physical Exam   GEN: WD frail Neck: supple Cardiac: irregular with normal rate Respiratory: CTA GI: Soft, NT/ND MS: No edema Neuro:   Left hemiparesis; no new findings   Labs    Chemistry Recent Labs  Lab 01/26/18 0312 01/27/18 0225 01/28/18 0212  NA 142 142 140  K 3.3* 3.7 3.7  CL 98 101 100  CO2 27 32 27  GLUCOSE 95 90 95  BUN 18 20 21   CREATININE 0.99 0.85 0.73  CALCIUM 8.9 8.9 8.9  GFRNONAA 52* >60 >60    GFRAA >60 >60 >60  ANIONGAP 17* 9 13     Patient Profile     [age] y.o. female with severe L hemiparesis following ischemic CVA, persistentatrial fibrillationwith slow ventricular response and acute on chronic diastolic HF with mostly R heart failure findings.   Assessment & Plan    1 acute on chronic diastolic congestive heart failure-I/O-1680 (-13.5 L since admission). Wt 82 kg.  Patient feels much better but not back to baseline. She is euvolemic, started oral lasix yesterday, 80 mg in the morning and 40 in the afternoon, previously she was only on 40 daily at home with no spironolactone. Stable Crea and K. Continue fluid restriction and low-sodium diet.  She appears deconditioned, I will start physical therapy today.  2 Atrial fibrillation-patient remains in atrial fibrillation.  She is bradycardic but predominantly at night.  No plans for pacemaker.  Continue apixaban.  3 Prior CVA-residual left hemiparesis.  Continue apixaban.  4 ICA stenosis-continue statin.  5 hypokalemia-supplement  She can be discharged today, we will arrange for an outpatient follow up.  For questions or updates, please contact [facility] Please  consult [email or web address] for contact info under     Signed, Tobias AlexanderKatarina Hiran Leard, MD  01/28/2018, 8:10 AM

## 2018-01-29 DIAGNOSIS — I5023 Acute on chronic systolic (congestive) heart failure: Secondary | ICD-10-CM

## 2018-01-29 LAB — BASIC METABOLIC PANEL
Anion gap: 11 (ref 5–15)
BUN: 18 mg/dL (ref 8–23)
CO2: 31 mmol/L (ref 22–32)
Calcium: 9 mg/dL (ref 8.9–10.3)
Chloride: 99 mmol/L (ref 98–111)
Creatinine, Ser: 0.81 mg/dL (ref 0.44–1.00)
GFR calc Af Amer: >60 mL/min (ref >60)
GFR calc non Af Amer: >60 mL/min (ref >60)
Glucose, Bld: 103 mg/dL — ABNORMAL HIGH (ref 70–99)
Potassium: 3.6 mmol/L (ref 3.5–5.1)
Sodium: 141 mmol/L (ref 135–145)

## 2018-01-29 NOTE — Discharge Summary (Signed)
Physician Discharge Summary  Margaret Collier ZOX:[id number] DOB: [birth year] DOA: 01/17/2018  PCP: System, Pcp Not In  Admit date: 01/17/2018 Discharge date: 01/30/2018  Time spent: 45 minutes  Recommendations for Outpatient Follow-up:  Patient will be discharged to skilled nursing facility. Continue physical and occupational therapy.  Patient will need to follow up with primary care provider within one week of discharge, repeat BMP.  Follow up with cardiology in 1-2 weeks. Patient should continue medications as prescribed.  Patient should follow a heart healthy diet with 1500ml fluid restriction per day.  Discharge Diagnoses:  Bradycardia with slow ventricular response in a patient with atrial fibrillation Acute hypoxic respiratory failure secondary to diastolic CHF exacerbation Essential hypertension Paroxysmal atrial fibrillation Hyperlipidemia History of nonhemorrhagic CVA Hypokalemia Obstructive sleep apnea Constipation  Discharge Condition: Stable  Diet recommendation: As above  Filed Weights   01/26/18 0625 01/27/18 0500 01/28/18 0500  Weight: 84.1 kg 86.1 kg 81.9 kg    History of present illness:  On 12/13/2019by Dr. Darreld McleanVishal Patel Margaret Collier is a 84 y.o. female with medical history significant for history of CVA with residual left-sided paralysis, atrial fibrillation on Eliquis, hypertension, hyperlipidemia, reported OSA who presents from her nursing facility with progressive dyspnea and peripheral edema not improved with oral Lasix.  Patient reportedly had a chest x-ray done at her nursing facility which was suggestive of pneumonia.  She was started on Levaquin yesterday however transferred to ED due to continued shortness of breath.  Patient reports about 1 week of progressive shortness of breath associated with dry cough.  She says she has been receiving Lasix and reports good urine output without significant improvement.  She has had worsening peripheral edema.   She denies any chest pain, palpitations, fever, chills, diaphoresis, abdominal pain, or dysuria.  Hospital Course:  Bradycardia with slow ventricular response in a patient with atrial fibrillation -Not on a beta-blocker -Cardiology consulted and appreciated -Noted to be worse at night.  Patient with known obstructive sleep apnea but refusing CPAP. -Heart rate remains stable.  Patient appears to be asymptomatic at this time.  Acute hypoxic respiratory failure secondary to diastolic CHF exacerbation -Cardiology consulted and appreciated -Patient transition to oral Lasix -Continue Spironolactone, Lasix 80 mg in the morning, 40 mg in the evening -Follow-up with cardiology -Continue to monitor intake and output, daily weights  Essential hypertension -Continue diuretics -BP currently stable  Paroxysmal atrial fibrillation -Continue Eliquis  Hyperlipidemia -Continue statin  History of nonhemorrhagic CVA -August 2019.  Patient with residual left-sided paralysis and left facial droop -No focal deficits, continue pregabalin, amitriptyline, Eliquis, statin  Hypokalemia -Resolved, repeat BMP in one week  Obstructive sleep apnea -States not completing sleep study as an outpatient -Refuses CPAP  Constipation -resolved, continue bowel regimen  Procedures: None  Consultations: Cardiology  Discharge Exam: Vitals:   01/29/18 0835 01/29/18 1101  BP: 98/88 (!) 124/59  Pulse:  (!) 104  Resp: 13 16  Temp: (!) 97.4 F (36.3 C) (!) 97.3 F (36.3 C)  SpO2:  95%     General: Well developed, elderly, chronically ill appearing, NAD  HEENT: NCAT, mucous membranes moist.  Cardiovascular: S1 S2 auscultated, no murmur  Respiratory: Clear to auscultation bilaterally with equal chest rise  Abdomen: Soft, nontender, nondistended, + bowel sounds  Extremities: warm dry without cyanosis clubbing or edema  Neuro: AAOx3, chronic left hemiparesis  Psych: Appropriate mood and  affect  Discharge Instructions  Allergies as of 01/29/2018      Reactions   Chlorthalidone  On MAR   Dilaudid [hydromorphone Hcl]    On MAR   Fosamax [alendronate Sodium]    On MAR   Zanaflex [tizanidine Hcl]    On MAR      Medication List    STOP taking these medications   amLODipine 5 MG tablet Commonly known as:  NORVASC   levofloxacin 500 MG tablet Commonly known as:  LEVAQUIN     TAKE these medications   albuterol 108 (90 Base) MCG/ACT inhaler Commonly known as:  PROVENTIL HFA;VENTOLIN HFA Inhale 2 puffs into the lungs every 4 (four) hours as needed for wheezing or shortness of breath.   amitriptyline 10 MG tablet Commonly known as:  ELAVIL Take 10 mg by mouth at bedtime.   apixaban 5 MG Tabs tablet Commonly known as:  ELIQUIS Take 1 tablet (5 mg total) by mouth 2 (two) times daily. What changed:    medication strength  how much to take   atorvastatin 40 MG tablet Commonly known as:  LIPITOR Take 40 mg by mouth every evening.   b complex vitamins capsule Take 1 capsule by mouth daily.   calcium carbonate 500 MG chewable tablet Commonly known as:  TUMS - dosed in mg elemental calcium Chew 1 tablet by mouth 3 (three) times daily before meals.   chlorhexidine 0.12 % solution Commonly known as:  PERIDEX Use as directed 15 mLs in the mouth or throat 2 (two) times daily.   cholecalciferol 25 MCG (1000 UT) tablet Commonly known as:  VITAMIN D3 Take 1,000 Units by mouth daily.   furosemide 80 MG tablet Commonly known as:  LASIX Take 1 tablet (80 mg total) by mouth daily. What changed:    medication strength  how much to take   furosemide 40 MG tablet Commonly known as:  LASIX Take 1 tablet (40 mg total) by mouth at bedtime. What changed:  You were already taking a medication with the same name, and this prescription was added. Make sure you understand how and when to take each.   ipratropium-albuterol 0.5-2.5 (3) MG/3ML Soln Commonly known  as:  DUONEB Take 3 mLs by nebulization 3 (three) times daily. x7 days.   oxyCODONE-acetaminophen 10-325 MG tablet Commonly known as:  PERCOCET Take 1 tablet by mouth every 12 (twelve) hours.   OXYGEN Inhale 2 L/min into the lungs at bedtime.   pantoprazole 40 MG tablet Commonly known as:  PROTONIX Take 40 mg by mouth daily.   polyethylene glycol packet Commonly known as:  MIRALAX / GLYCOLAX Take 17 g by mouth daily as needed for moderate constipation.   potassium chloride SA 20 MEQ tablet Commonly known as:  K-DUR,KLOR-CON Take 20 mEq by mouth daily.   pregabalin 25 MG capsule Commonly known as:  LYRICA Take 25 mg by mouth 2 (two) times daily.   spironolactone 25 MG tablet Commonly known as:  ALDACTONE Take 0.5 tablets (12.5 mg total) by mouth daily.      Allergies  Allergen Reactions  . Chlorthalidone     On MAR  . Dilaudid [Hydromorphone Hcl]     On MAR  . Fosamax [Alendronate Sodium]     On MAR  . Zanaflex [Tizanidine Hcl]     On MAR    Contact information for follow-up providers    Azalee CourseMeng, [place]ao, [place] Follow up on 02/19/2018.   Specialties:  Cardiology, Radiology Why:  Your follow up appointment will be on 02/19/2018 at 2 pm Contact information: [address] [place] [address]  [phone]        Merleen Milliner, MD. Schedule an appointment as soon as possible for a visit in 1 week(s).   Specialty:  Cardiology Why:  Hospital follow up Contact information: [address] [place] [address] [phone]            Contact information for after-discharge care    Destination    [facility] .   Service:  Skilled Nursing Contact information: [address] [place] [address] [phone]                   The results of significant diagnostics from this hospitalization (including imaging, microbiology, ancillary and laboratory) are listed below for reference.     Significant Diagnostic Studies: Dg Chest 2 View  Result Date: 01/17/2018 CLINICAL DATA:  Shortness of breath. EXAM: CHEST - 2 VIEW COMPARISON:  None. FINDINGS: Cardiomegaly. Normal pulmonary vascularity. Atherosclerotic calcification of the aortic arch. Possible small left pleural effusion. No consolidation or pneumothorax. Elevation of the right hemidiaphragm. No acute osseous abnormality. IMPRESSION: 1. Cardiomegaly.  Possible small left pleural effusion. Electronically Signed   By: Obie Dredge M.D.   On: 01/17/2018 17:45    Microbiology: No results found for this or any previous visit (from the past 240 hour(s)).   Labs: Basic Metabolic Panel: Recent Labs  Lab 01/25/18 0126 01/26/18 0312 01/27/18 0225 01/28/18 0212 01/29/18 0238  NA 142 142 142 140 141  K 3.5 3.3* 3.7 3.7 3.6  CL 100 98 101 100 99  CO2 30 27 32 27 31  GLUCOSE 88 95 90 95 103*  BUN 18 18 20 21 18   CREATININE 0.75 0.99 0.85 0.73 0.81  CALCIUM 8.7* 8.9 8.9 8.9 9.0   Liver Function Tests: No results for input(s): AST, ALT, ALKPHOS, BILITOT, PROT, ALBUMIN in the last 168 hours. No results for input(s): LIPASE, AMYLASE in the last 168 hours. No results for input(s): AMMONIA in the last 168 hours. CBC: No results for input(s): WBC, NEUTROABS, HGB, HCT, MCV, PLT in the last 168 hours. Cardiac Enzymes: No results for input(s): CKTOTAL, CKMB, CKMBINDEX, TROPONINI in the last 168 hours. BNP: BNP (last 3 results) Recent Labs    01/17/18 1647  BNP 295.6*    ProBNP (last 3 results) No results for input(s): PROBNP in the last 8760 hours.  CBG: Recent Labs  Lab 01/26/18 2149 01/27/18 0019 01/27/18 0746 01/27/18 1154 01/27/18 1637  GLUCAP 109* 91 80 89 82       Signed:  Sharra Cayabyab  [facility] 01/29/2018, 11:18 AM

## 2018-01-29 NOTE — Progress Notes (Signed)
Progress Note  Patient Name: Margaret RobesDorothy Shryock Date of Encounter: 01/29/2018  Primary Cardiologist: Dr Beverely Paceheek at [facility]  Subjective   No cardiac complaints this am   Inpatient Medications    Scheduled Meds: . amitriptyline  10 mg Oral QHS  . apixaban  5 mg Oral BID  . atorvastatin  40 mg Oral QPM  . furosemide  80 mg Oral BID  . HYDROcodone-acetaminophen  2 tablet Oral Once  . magnesium citrate  1 Bottle Oral Once  . oxyCODONE-acetaminophen  1 tablet Oral Q12H   And  . oxyCODONE  5 mg Oral BID  . pantoprazole  40 mg Oral Daily  . pregabalin  25 mg Oral BID  . sodium chloride flush  3 mL Intravenous Q12H  . spironolactone  12.5 mg Oral Daily   Continuous Infusions:  PRN Meds: acetaminophen **OR** acetaminophen, albuterol, polyethylene glycol   Vital Signs    Vitals:   01/28/18 0726 01/28/18 0800 01/28/18 1153 01/28/18 1948  BP: (!) 117/52  (!) 125/57 (!) 126/59  Pulse: (!) 57 (!) 58  (!) 57  Resp: 20 13 (!) 21 13  Temp: (!) 97.4 F (36.3 C)  (!) 97.5 F (36.4 C) 97.6 F (36.4 C)  TempSrc: Oral  Oral Oral  SpO2: 100% 93%  96%  Weight:      Height:        Intake/Output Summary (Last 24 hours) at 01/29/2018 0719 Last data filed at 01/29/2018 0630 Gross per 24 hour  Intake 1020 ml  Output 1600 ml  Net -580 ml   Filed Weights   01/26/18 0625 01/27/18 0500 01/28/18 0500  Weight: 84.1 kg 86.1 kg 81.9 kg   Telemetry    Atrial fibrillation with CVR - Personally Reviewed  Physical Exam   Affect appropriate Frail elderly female  HEENT: normal Neck supple with no adenopathy JVP normal no bruits no thyromegaly Lungs clear with no wheezing and good diaphragmatic motion Heart:  S1/S2 no murmur, no rub, gallop or click PMI normal Abdomen: benighn, BS positve, no tenderness, no AAA no bruit.  No HSM or HJR Distal pulses intact with no bruits No edema Neuro left hemi paresis  Skin warm and dry No muscular weakness   Labs    Chemistry Recent Labs    Lab 01/27/18 0225 01/28/18 0212 01/29/18 0238  NA 142 140 141  K 3.7 3.7 3.6  CL 101 100 99  CO2 32 27 31  GLUCOSE 90 95 103*  BUN 20 21 18   CREATININE 0.85 0.73 0.81  CALCIUM 8.9 8.9 9.0  GFRNONAA >60 >60 >60  GFRAA >60 >60 >60  ANIONGAP 9 13 11      Patient Profile     84 y.o. female with severe L hemiparesis following ischemic CVA, persistentatrial fibrillationwith slow ventricular response and acute on chronic diastolic HF with mostly R heart failure findings.   Assessment & Plan    1 acute on chronic diastolic congestive heart failure-improved now on oral lasix lytes ok this Am continue oral lasix dose   2 Atrial fibrillation-patient remains in atrial fibrillation.  She is bradycardic but predominantly at night.  No plans for pacemaker.  Continue apixaban. Rates in 2650' s this am    3 Prior CVA-residual left hemiparesis.  Continue apixaban.  4 ICA stenosis-continue statin.  5 hypokalemia-supplement improved 3.6 this am   She can be discharged today, we will arrange for an outpatient follow up.  For questions or updates, please contact [facility] Please consult  [email or web address] for contact info under     Signed, Charlton HawsPeter Elpidia Karn, MD  01/29/2018, 7:19 AM

## 2018-01-30 NOTE — Progress Notes (Signed)
Discharge summary done on 01/29/2018. Patient stable for discharge today. No changes made to summary.  Clover Feehan D.O. [facility] Pager [phone]  If 7PM-7AM, please contact night-coverage [email or web address] Password [email or web address] 01/30/2018, 9:50 AM

## 2018-01-30 NOTE — Clinical Social Work Placement (Signed)
   CLINICAL SOCIAL WORK PLACEMENT  NOTE  Date:  01/30/2018  Patient Details  Name: Margaret Collier MRN: [id number] Date of Birth: Oct 22, 1931  Clinical Social Work is seeking post-discharge placement for this patient at the Skilled  Nursing Facility level of care (*CSW will initial, date and re-position this form in  chart as items are completed):  Yes   Patient/family provided with [facility] Clinical Social Work Department's list of facilities offering this level of care within the geographic area requested by the patient (or if unable, by the patient's family).      Patient/family informed of their freedom to choose among providers that offer the needed level of care, that participate in Medicare, Medicaid or managed care program needed by the patient, have an available bed and are willing to accept the patient.      Patient/family informed of [facility]'s ownership interest in [facility] and [facility], as well as of the fact that they are under no obligation to receive care at these facilities.  PASRR submitted to EDS on       PASRR number received on 01/20/18     Existing PASRR number confirmed on       FL2 transmitted to all facilities in geographic area requested by pt/family on 01/20/18     FL2 transmitted to all facilities within larger geographic area on       Patient informed that his/her managed care company has contracts with or will negotiate with certain facilities, including the following:        Yes   Patient/family informed of bed offers received.  [facility]     Physician recommends and patient chooses bed at      [facility]  Patient to be transferred to facility by [facility]AR     Patient family notified on 01/30/18 of transfer.  Name of family member notified:  Judeth CornfieldStephanie     PHYSICIAN       Additional Comment:    _______________________________________________ Gildardo GriffesAshley M Denika Krone,  LCSW 01/30/2018, 10:12 AM

## 2018-01-30 NOTE — Progress Notes (Signed)
Patient will DC to: [facility] Anticipated DC date: 01/30/18 Family notified: Judeth CornfieldStephanie Transport by: Sharin MonsPTAR  Per MD [facility]. RN, patient, patient's family, and facility notified of DC. Discharge Summary sent to facility. RN given number for report [phone]. DC packet on chart. Ambulance transport requested for patient.  CSW signing off.  [place]ley Raffaela Ladley, [place]CSW [phone]

## 2018-01-30 NOTE — Care Management Important Message (Signed)
Important Message  Patient Details  Name: Margaret RobesDorothy Krehbiel MRN: [id number] Date of Birth: Apr 16, 1931   Medicare Important Message Given:  Yes    Oralia RudMegan P Mickayla Trouten 01/30/2018, 4:46 PM

## 2018-01-30 NOTE — Progress Notes (Signed)
Pt discharge [facility] via [facility]. VS stable, room air. RN called for report. All questions answered.

## 2018-01-31 ENCOUNTER — Emergency Department (HOSPITAL_COMMUNITY): Payer: Medicare Other

## 2018-01-31 ENCOUNTER — Emergency Department (HOSPITAL_COMMUNITY)
Admission: EM | Admit: 2018-01-31 | Discharge: 2018-02-01 | Disposition: A | Discharge status: Home / Self Care | Payer: Medicare Other | Attending: Emergency Medicine | Admitting: Emergency Medicine

## 2018-01-31 ENCOUNTER — Encounter: Payer: Self-pay | Admitting: Emergency Medicine

## 2018-01-31 DIAGNOSIS — Z23 Encounter for immunization: Secondary | ICD-10-CM | POA: Diagnosis not present

## 2018-01-31 DIAGNOSIS — I509 Heart failure, unspecified: Secondary | ICD-10-CM | POA: Diagnosis not present

## 2018-01-31 DIAGNOSIS — Z79899 Other long term (current) drug therapy: Secondary | ICD-10-CM | POA: Insufficient documentation

## 2018-01-31 DIAGNOSIS — Y999 Unspecified external cause status: Secondary | ICD-10-CM | POA: Diagnosis not present

## 2018-01-31 DIAGNOSIS — S81811A Laceration without foreign body, right lower leg, initial encounter: Secondary | ICD-10-CM | POA: Insufficient documentation

## 2018-01-31 DIAGNOSIS — I10 Essential (primary) hypertension: Secondary | ICD-10-CM | POA: Insufficient documentation

## 2018-01-31 DIAGNOSIS — Z7901 Long term (current) use of anticoagulants: Secondary | ICD-10-CM | POA: Insufficient documentation

## 2018-01-31 DIAGNOSIS — S8991XA Unspecified injury of right lower leg, initial encounter: Secondary | ICD-10-CM

## 2018-01-31 DIAGNOSIS — I4891 Unspecified atrial fibrillation: Secondary | ICD-10-CM | POA: Diagnosis not present

## 2018-01-31 DIAGNOSIS — I2510 Atherosclerotic heart disease of native coronary artery without angina pectoris: Secondary | ICD-10-CM | POA: Diagnosis not present

## 2018-01-31 DIAGNOSIS — Y939 Activity, unspecified: Secondary | ICD-10-CM | POA: Diagnosis not present

## 2018-01-31 DIAGNOSIS — W050XXA Fall from non-moving wheelchair, initial encounter: Secondary | ICD-10-CM | POA: Insufficient documentation

## 2018-01-31 DIAGNOSIS — Y92122 Bedroom in nursing home as the place of occurrence of the external cause: Secondary | ICD-10-CM | POA: Insufficient documentation

## 2018-01-31 MED ORDER — LIDOCAINE-EPINEPHRINE 2 %-1:50000 IJ SOLN: 20.0000 mL | Freq: Once | INTRAMUSCULAR | Status: DC

## 2018-01-31 MED ORDER — CLINDAMYCIN HCL 300 MG PO CAPS: 300.0000 mg | ORAL_CAPSULE | Freq: Three times a day (TID) | ORAL | 0 refills | Status: AC

## 2018-01-31 MED ORDER — LIDOCAINE HCL (PF) 1 % IJ SOLN
30.0000 mL | Freq: Once | INTRAMUSCULAR | Status: AC
  Administered 2018-01-31: 30 mL
  Filled 2018-01-31: qty 30

## 2018-01-31 MED ORDER — TETANUS-DIPHTH-ACELL PERTUSSIS 5-2.5-18.5 LF-MCG/0.5 IM SUSP
0.5000 mL | Freq: Once | INTRAMUSCULAR | Status: AC
  Administered 2018-01-31: 0.5 mL via INTRAMUSCULAR
  Filled 2018-01-31: qty 0.5

## 2018-01-31 NOTE — ED Provider Notes (Signed)
[facility] EMERGENCY DEPARTMENT Provider Note   CSN: [id number] Arrival date & time: 01/31/18  1701     History   Chief Complaint Chief Complaint  Patient presents with  . Leg Injury    HPI Margaret Collier is a 84 y.o. female.  HPI  Patient is a 84 year old female with a past medical history of A. fib on Eliquis, CAD on ASA and Plavix, HTN, HDL, OSA, obesity, and recent ischemic CVA this month who presents via EMS from her living facility after a ground-level mechanical fall that happened while she was attempting to transfer between her wheelchair and bed.  Patient adamantly denies any presyncopal syncopal symptoms and states she fell because she has chronic poor balance from her stroke.  Patient states she lost her balance and struck her right lower leg against the foot hold of her wheelchair.  She states her only injury is to the anterior part of her right lower leg.  She denies any LOC, striking her head, neck or back pain, chest or abdominal pain, or other acute traumatic injuries.  She denies any recent fevers, chest pain, shortness of breath, cough, abdominal pain, diarrhea, dysuria, or other acute complaints.  She denies prior similar episodes.  Denies alleviating or aggravating factors.  Patient does note that residual weakness from her stroke includes left-sided hemiparesis as well as left-sided facial droop.   Past Medical History:  Diagnosis Date  . Atrial fibrillation (HCC)   . Hyperlipidemia   . Hypertension   . OSA (obstructive sleep apnea)     Patient Active Problem List   Diagnosis Date Noted  . Bradycardia 01/21/2018  . Acute exacerbation of CHF (congestive heart failure) (HCC) 01/17/2018  . Atrial fibrillation (HCC) 01/17/2018  . Hypertension 01/17/2018  . Hyperlipidemia 01/17/2018  . OSA (obstructive sleep apnea) 01/17/2018  . History of CVA (cerebrovascular accident) without residual deficits 01/17/2018    History reviewed. No  pertinent surgical history.   OB History   No obstetric history on file.      Home Medications    Prior to Admission medications   Medication Sig Start Date End Date Taking? Authorizing Provider  amitriptyline (ELAVIL) 10 MG tablet Take 10 mg by mouth at bedtime.   Yes [provider]  apixaban (ELIQUIS) 5 MG TABS tablet Take 1 tablet (5 mg total) by mouth 2 (two) times daily. 01/28/18 02/27/18 Yes Jerald Kief, MD  atorvastatin (LIPITOR) 40 MG tablet Take 40 mg by mouth at bedtime.    Yes [provider]  b complex vitamins capsule Take 1 capsule by mouth daily.   Yes [provider]  calcium carbonate (TUMS - DOSED IN MG ELEMENTAL CALCIUM) 500 MG chewable tablet Chew 1 tablet by mouth 3 (three) times daily before meals.   Yes [provider]  chlorhexidine (PERIDEX) 0.12 % solution Use as directed 15 mLs in the mouth or throat 2 (two) times daily.   Yes [provider]  cholecalciferol (VITAMIN D3) 25 MCG (1000 UT) tablet Take 1,000 Units by mouth daily.   Yes [provider]  furosemide (LASIX) 40 MG tablet Take 1 tablet (40 mg total) by mouth at bedtime. 01/28/18 02/27/18 Yes Jerald Kief, MD  furosemide (LASIX) 40 MG tablet Take 40 mg by mouth at bedtime.   Yes [provider]  furosemide (LASIX) 80 MG tablet Take 1 tablet (80 mg total) by mouth daily. 01/28/18 02/27/18 Yes Jerald Kief, MD  ipratropium-albuterol (DUONEB) 0.5-2.5 (3)  MG/3ML SOLN Take 3 mLs by nebulization 3 (three) times daily. Swish and spit with water after treatment   Yes [provider]  oxyCODONE-acetaminophen (PERCOCET) 10-325 MG tablet Take 1 tablet by mouth every 12 (twelve) hours. 01/28/18  Yes Jerald Kief, MD  OXYGEN Inhale 2 L/min into the lungs See admin instructions. Apply via nasal canula daily at bedtime and remove every morning   Yes [provider]  pantoprazole (PROTONIX) 40 MG tablet Take 40 mg by mouth daily at  6 (six) AM.    Yes [provider]  potassium chloride SA (K-DUR,KLOR-CON) 20 MEQ tablet Take 20 mEq by mouth daily.   Yes [provider]  pregabalin (LYRICA) 25 MG capsule Take 25 mg by mouth 2 (two) times daily.   Yes [provider]  spironolactone (ALDACTONE) 25 MG tablet Take 0.5 tablets (12.5 mg total) by mouth daily. 01/29/18 02/28/18 Yes Jerald Kief, MD  clindamycin (CLEOCIN) 300 MG capsule Take 1 capsule (300 mg total) by mouth 3 (three) times daily for 10 days. 01/31/18 02/10/18  Antoine Primas, MD  polyethylene glycol [facility]) packet Take 17 g by mouth daily as needed for moderate constipation. Patient not taking: Reported on 01/31/2018 01/28/18   Jerald Kief, MD    Family History Family History  Problem Relation Age of Onset  . Heart disease Mother     Social History Social History   Tobacco Use  . Smoking status: Never Smoker  . Smokeless tobacco: Never Used  Substance Use Topics  . Alcohol use: Not on file  . Drug use: Not on file     Allergies   Zanaflex [tizanidine hcl]; Alprazolam; Carisoprodol; Chlorthalidone; Colesevelam; Dilaudid [hydromorphone hcl]; Duloxetine; Felodipine; Fosamax [alendronate sodium]; Furosemide; Gabapentin; Ibuprofen; Lovastatin; Meclizine; Meperidine; Methadone; Morphine; Niacin; Omeprazole; Oxycodone; Pravastatin; Simvastatin; Statins; Tramadol; Valsartan; and Hydrocodone-acetaminophen   Review of Systems Review of Systems  Constitutional: Negative for chills and fever.  HENT: Negative for ear pain and sore throat.   Eyes: Negative for pain and visual disturbance.  Respiratory: Negative for cough and shortness of breath.   Cardiovascular: Negative for chest pain and palpitations.  Gastrointestinal: Negative for abdominal pain and vomiting.  Genitourinary: Negative for dysuria and hematuria.  Musculoskeletal: Negative for arthralgias and back pain.  Skin: Positive for wound ( RLE, anterior  ). Negative for color change and rash.  Neurological: Negative for seizures and syncope.  All other systems reviewed and are negative.    Physical Exam Updated Vital Signs BP (!) 106/58   Pulse 63   Temp 97.7 F (36.5 C) (Oral)   Resp 16   SpO2 100%   Physical Exam Vitals signs and nursing note reviewed.  Constitutional:      General: She is not in acute distress.    Appearance: She is well-developed. She is obese.  HENT:     Head: Normocephalic and atraumatic.     Right Ear: External ear normal.     Left Ear: External ear normal.  Eyes:     Conjunctiva/sclera: Conjunctivae normal.  Neck:     Musculoskeletal: Neck supple.  Cardiovascular:     Rate and Rhythm: Normal rate and regular rhythm.     Pulses:          Radial pulses are 2+ on the right side and 2+ on the left side.       Dorsalis pedis pulses are 2+ on the right side and 2+ on the left side.  Heart sounds: No murmur.  Pulmonary:     Effort: Pulmonary effort is normal. No respiratory distress.     Breath sounds: Normal breath sounds.  Abdominal:     Palpations: Abdomen is soft.     Tenderness: There is no abdominal tenderness.  Musculoskeletal:     Right lower leg: Edema present.     Left lower leg: Edema present.  Skin:    General: Skin is warm and dry.     Findings: Laceration present.     Comments: There is an approximately 5 inch laceration over the anterior aspect of the patient's right lower extremity.  The laceration is hemostatic and appears to violate the subcu cutaneous fat but no bone is exposed.  Neurological:     Mental Status: She is alert.     Cranial Nerves: Facial asymmetry ( Left-sided facial droop.) present.     Motor: Weakness present.     Comments: Patient is unable to move her left upper and left lower extremities on command.  Her left upper extremity is held in a flexed position.  Sensation is intact sensation to light touch in all extremities.  Patient is able to dorsi and  plantarflex her right foot and strength is intact throughout her right upper extremity.      ED Treatments / Results  Labs (all labs ordered are listed, but only abnormal results are displayed) Labs Reviewed - No data to display  EKG EKG Interpretation  Date/Time:  Friday January 31 2018 19:26:05 EST Ventricular Rate:  63 PR Interval:    QRS Duration: 109 QT Interval:  465 QTC Calculation: 476 R Axis:   -74 Text Interpretation:  Atrial fibrillation Left anterior fascicular block Abnormal R-wave progression, late transition Nonspecific T abnormalities, lateral leads When compared to prior, no significant changes.  No STEMI Confirmed by Theda Belfastegeler, Chris ([id number]) on 01/31/2018 7:32:47 PM   Radiology Dg Tibia/fibula Right  Result Date: 01/31/2018 CLINICAL DATA:  Pt states that she fell out of her wheelchair this evening and it fell onto her right leg. Pt states that the more she tried to get the chair off of her, the more it cut into her right leg. Large open lac to middle right tib fib. EXAM: RIGHT TIBIA AND FIBULA - 2 VIEW COMPARISON:  12/30/2012 femur FINDINGS: No evidence for acute fracture or subluxation. Soft tissue defect and edema noted along the LATERAL aspect of the tibia and fibula. There is soft tissue edema of the ankle. Previous knee arthroplasty appears intact. IMPRESSION: Soft tissue defect along the LATERAL aspect of the tibia and fibula. No acute fracture. Electronically Signed   By: Norva PavlovElizabeth  Brown M.D.   On: 01/31/2018 18:37    Procedures .Marland Kitchen.Laceration Repair Date/Time: 02/01/2018 12:03 AM Performed by: Antoine PrimasSmith, Zachary, MD Authorized by: Antoine PrimasSmith, Zachary, MD   Consent:    Consent obtained:  Verbal   Consent given by:  Patient   Risks discussed:  Pain and infection   Alternatives discussed:  No treatment Anesthesia (see MAR for exact dosages):    Anesthesia method:  Local infiltration   Local anesthetic:  Lidocaine 1% w/o epi Laceration details:    Location:   Leg   Leg location:  R lower leg   Length (cm):  6   Depth (mm):  2 Repair type:    Repair type:  Complex Pre-procedure details:    Preparation:  Imaging obtained to evaluate for foreign bodies Exploration:    Hemostasis achieved with:  Direct pressure  Wound exploration: wound explored through full range of motion     Wound extent: no foreign bodies/material noted     Contaminated: no   Treatment:    Amount of cleaning:  Standard   Irrigation solution:  Sterile water   Irrigation method:  Syringe   Visualized foreign bodies/material removed: no     Debridement:  None   Undermining:  None   Scar revision: no   Skin repair:    Repair method:  Sutures (4 Vicryl sutures were placed to bring the subcutaneous tissue together.  Subsequently 8 Prolene mattress sutures were placed superficially as well as 5 simple interrupted sutures.)   Suture technique:  Simple interrupted, horizontal mattress and subcuticular Approximation:    Approximation:  Close Post-procedure details:    Dressing:  Adhesive bandage   Patient tolerance of procedure:  Tolerated well, no immediate complications   (including critical care time)  Medications Ordered in ED Medications  Tdap (BOOSTRIX) injection 0.5 mL (0.5 mLs Intramuscular Given 01/31/18 2159)  lidocaine (PF) (XYLOCAINE) 1 % injection 30 mL (30 mLs Infiltration Given by Other 01/31/18 2159)     Initial Impression / Assessment and Plan / ED Course  I have reviewed the triage vital signs and the nursing notes.  Pertinent labs & imaging results that were available during my care of the patient were reviewed by me and considered in my medical decision making (see chart for details).     Patient is a 84 year old female who presents with above-stated history exam.  On presentation patient is afebrile stable vital signs.  Exam as above remarkable for laceration over the anterior aspect of right lower extremity.  X-rays obtained that did not show any  acute fractures or dislocations and not show any radiopaque foreign bodies.  History exam is not consistent with any other acute traumatic injuries.  History is most consistent with a ground-level mechanical fall I have a very low suspicion for any other etiologies including ACS, PE, or unspecified syncope.  Laceration was appeared per details as above.  Tetanus was updated.  Patient discharged in stable condition.  Patient given a prescription to be taken for 10 days.  Instructed to follow-up with her PCP in 5 to 7 days for wound check and suture removal.  Patient discharged in stable condition.  Strict return precautions advised and discussed.  Final Clinical Impressions(s) / ED Diagnoses   Final diagnoses:  Injury of right lower extremity, initial encounter    ED Discharge Orders         Ordered    clindamycin (CLEOCIN) 300 MG capsule  3 times daily     01/31/18 2132           Antoine PrimasSmith, Zachary, MD 02/01/18 0006    Tegeler, Canary Brimhristopher J, MD 02/01/18 [phone]

## 2018-01-31 NOTE — ED Notes (Signed)
[facility] given to [facility]. Wound care orders given. All questions answered

## 2018-01-31 NOTE — ED Triage Notes (Signed)
Pt had a fall while transitioning from wheelchair to bed which resulted in a large laceration to right lower leg. 3-4 inches long. Bleeding controlled at this time. Pt on eliquis. Pt denies any LOC. Pt received 100 mcg fentanyl by ems. Pt is alert and oriented per baseline per ems. [facility].

## 2018-02-01 NOTE — ED Notes (Signed)
Several hours passed since this RN requested transport. Call was apparently unsuccessful with secetary. Checked with [employer] operational officersecretary again and transport called. Pt resting comfortably.

## 2018-02-01 NOTE — ED Notes (Signed)
Called ptar for pt, Margaret Collier  

## 2018-02-01 NOTE — ED Notes (Signed)
Patient verbalizes understanding of discharge instructions. Opportunity for questioning and answers were provided. Armband removed by staff, pt discharged from ED by stretcher

## 2018-02-19 ENCOUNTER — Ambulatory Visit: Payer: Medicare Other | Admitting: Physician Assistant

## 2018-02-23 ENCOUNTER — Emergency Department (HOSPITAL_COMMUNITY): Payer: Medicare Other

## 2018-02-23 ENCOUNTER — Emergency Department (HOSPITAL_COMMUNITY)
Admission: EM | Admit: 2018-02-23 | Discharge: 2018-02-23 | Disposition: A | Discharge status: Home / Self Care | Payer: Medicare Other | Attending: Emergency Medicine | Admitting: Emergency Medicine

## 2018-02-23 ENCOUNTER — Other Ambulatory Visit: Payer: Self-pay

## 2018-02-23 ENCOUNTER — Encounter (HOSPITAL_COMMUNITY): Payer: Self-pay

## 2018-02-23 DIAGNOSIS — I4891 Unspecified atrial fibrillation: Secondary | ICD-10-CM | POA: Diagnosis not present

## 2018-02-23 DIAGNOSIS — Y9389 Activity, other specified: Secondary | ICD-10-CM | POA: Insufficient documentation

## 2018-02-23 DIAGNOSIS — W268XXA Contact with other sharp object(s), not elsewhere classified, initial encounter: Secondary | ICD-10-CM | POA: Diagnosis not present

## 2018-02-23 DIAGNOSIS — Y929 Unspecified place or not applicable: Secondary | ICD-10-CM | POA: Diagnosis not present

## 2018-02-23 DIAGNOSIS — S81812A Laceration without foreign body, left lower leg, initial encounter: Secondary | ICD-10-CM

## 2018-02-23 DIAGNOSIS — Z7901 Long term (current) use of anticoagulants: Secondary | ICD-10-CM | POA: Diagnosis not present

## 2018-02-23 DIAGNOSIS — I10 Essential (primary) hypertension: Secondary | ICD-10-CM | POA: Diagnosis not present

## 2018-02-23 DIAGNOSIS — S71112A Laceration without foreign body, left thigh, initial encounter: Secondary | ICD-10-CM | POA: Diagnosis not present

## 2018-02-23 DIAGNOSIS — Y999 Unspecified external cause status: Secondary | ICD-10-CM | POA: Diagnosis not present

## 2018-02-23 DIAGNOSIS — Z79899 Other long term (current) drug therapy: Secondary | ICD-10-CM | POA: Diagnosis not present

## 2018-02-23 MED ORDER — LIDOCAINE-EPINEPHRINE (PF) 2 %-1:200000 IJ SOLN
10.0000 mL | Freq: Once | INTRAMUSCULAR | Status: AC
  Administered 2018-02-23: 10 mL
  Filled 2018-02-23: qty 20

## 2018-02-23 MED ORDER — CEPHALEXIN 500 MG PO CAPS: 500.0000 mg | ORAL_CAPSULE | Freq: Two times a day (BID) | ORAL | 0 refills | Status: AC

## 2018-02-23 NOTE — ED Notes (Signed)
Bed: WA17 Expected date:  Expected time:  Means of arrival:  Comments: EMS 

## 2018-02-23 NOTE — ED Provider Notes (Signed)
[facility]-EMERGENCY DEPT Provider Note   CSN: [id number] Arrival date & time: 02/23/18  1252     History   Chief Complaint No chief complaint on file.   HPI Margaret Collier is a 85 y.o. female with a PMHx of Afib on eliquis, HTN, CHF, HLD, CVA, and other conditions listed below, who presents to the ED with complaints of L leg wound sustained about 1 hour PTA.  Patient states that she is bedbound/wheelchair bound, and that they rotate her in bed every 2 hours.  She states that today her left leg got caught on the edge of the mattress which has a plastic edge, and caused a laceration to her left leg.  This occurred about an hour ago.  She denies having any falls or any other cause for her injury.  The bleeding is been controlled with gauze.  She complains of 6/10 constant sore nonradiating pain to the left leg wound, worse with palpation of the area, and with no treatments tried prior to arrival.  She denies any new or worsening numbness or tingling, new or worsening weakness, chest pain, shortness of breath, abdominal pain, nausea, vomiting, or any other complaints at this time.  Of note, chart review reveals that she was seen here on 01/31/18 for R shin laceration sustained on a wheelchair footplate, xrays were negative for fx or retained FB, Tdap was updated, wound was closed using 8 vicryl subcuticular stitches then 8 prolene mattress sutures and 5 prolene simple interrupted sutures.  She states that these sutures were removed a few days ago and the area has been doing well. She believes that wound also got caught on the edge of the mattress, but is not open at this time. She is on eliquis.   The history is provided by the patient and medical records. No language interpreter was used.    Past Medical History:  Diagnosis Date  . Atrial fibrillation (HCC)   . Hyperlipidemia   . Hypertension   . OSA (obstructive sleep apnea)     Patient Active Problem List   Diagnosis Date Noted  . Bradycardia 01/21/2018  . Acute exacerbation of CHF (congestive heart failure) (HCC) 01/17/2018  . Atrial fibrillation (HCC) 01/17/2018  . Hypertension 01/17/2018  . Hyperlipidemia 01/17/2018  . OSA (obstructive sleep apnea) 01/17/2018  . History of CVA (cerebrovascular accident) without residual deficits 01/17/2018    History reviewed. No pertinent surgical history.   OB History   No obstetric history on file.      Home Medications    Prior to Admission medications   Medication Sig Start Date End Date Taking? Authorizing Provider  amitriptyline (ELAVIL) 10 MG tablet Take 10 mg by mouth at bedtime.    [provider]  apixaban (ELIQUIS) 5 MG TABS tablet Take 1 tablet (5 mg total) by mouth 2 (two) times daily. 01/28/18 02/27/18  Jerald Kief, MD  atorvastatin (LIPITOR) 40 MG tablet Take 40 mg by mouth at bedtime.     [provider]  b complex vitamins capsule Take 1 capsule by mouth daily.    [provider]  calcium carbonate (TUMS - DOSED IN MG ELEMENTAL CALCIUM) 500 MG chewable tablet Chew 1 tablet by mouth 3 (three) times daily before meals.    [provider]  chlorhexidine (PERIDEX) 0.12 % solution Use as directed 15 mLs in the mouth or throat 2 (two) times daily.    [provider]  cholecalciferol (VITAMIN D3) 25 MCG (1000 UT)  tablet Take 1,000 Units by mouth daily.    [provider]  furosemide (LASIX) 40 MG tablet Take 1 tablet (40 mg total) by mouth at bedtime. 01/28/18 02/27/18  Jerald Kiefhiu, Stephen K, MD  furosemide (LASIX) 40 MG tablet Take 40 mg by mouth at bedtime.    [provider]  furosemide (LASIX) 80 MG tablet Take 1 tablet (80 mg total) by mouth daily. 01/28/18 02/27/18  Jerald Kiefhiu, Stephen K, MD  ipratropium-albuterol (DUONEB) 0.5-2.5 (3) MG/3ML SOLN Take 3 mLs by nebulization 3 (three) times daily. Swish and spit with water after treatment    [provider]    oxyCODONE-acetaminophen (PERCOCET) 10-325 MG tablet Take 1 tablet by mouth every 12 (twelve) hours. 01/28/18   Jerald Kiefhiu, Stephen K, MD  OXYGEN Inhale 2 L/min into the lungs See admin instructions. Apply via nasal canula daily at bedtime and remove every morning    [provider]  pantoprazole (PROTONIX) 40 MG tablet Take 40 mg by mouth daily at 6 (six) AM.     [provider]  polyethylene glycol (MIRALAX / GLYCOLAX) packet Take 17 g by mouth daily as needed for moderate constipation. Patient not taking: Reported on 01/31/2018 01/28/18   Jerald Kiefhiu, Stephen K, MD  potassium chloride SA (K-DUR,KLOR-CON) 20 MEQ tablet Take 20 mEq by mouth daily.    [provider]  pregabalin (LYRICA) 25 MG capsule Take 25 mg by mouth 2 (two) times daily.    [provider]  spironolactone (ALDACTONE) 25 MG tablet Take 0.5 tablets (12.5 mg total) by mouth daily. 01/29/18 02/28/18  Jerald Kiefhiu, Stephen K, MD    Family History Family History  Problem Relation Age of Onset  . Heart disease Mother     Social History Social History   Tobacco Use  . Smoking status: Never Smoker  . Smokeless tobacco: Never Used  Substance Use Topics  . Alcohol use: Not on file  . Drug use: Not on file     Allergies   Zanaflex [tizanidine hcl]; Alprazolam; Carisoprodol; Chlorthalidone; Colesevelam; Dilaudid [hydromorphone hcl]; Duloxetine; Felodipine; Fosamax [alendronate sodium]; Furosemide; Gabapentin; Ibuprofen; Lovastatin; Meclizine; Meperidine; Methadone; Morphine; Niacin; Omeprazole; Oxycodone; Pravastatin; Simvastatin; Statins; Tramadol; Valsartan; and Hydrocodone-acetaminophen   Review of Systems Review of Systems  Respiratory: Negative for shortness of breath.   Cardiovascular: Negative for chest pain.  Gastrointestinal: Negative for abdominal pain, nausea and vomiting.  Musculoskeletal: Positive for myalgias. Negative for arthralgias.  Skin: Positive for wound.  Allergic/Immunologic:  Negative for immunocompromised state.  Neurological: Negative for weakness and numbness.  Hematological: Bruises/bleeds easily (on eliquis).  Psychiatric/Behavioral: Negative for confusion.     Physical Exam Updated Vital Signs BP (!) 113/56   Pulse 60   Temp 97.7 F (36.5 C)   Resp 16   SpO2 98%    Physical Exam Vitals signs and nursing note reviewed.  Constitutional:      General: She is not in acute distress.    Appearance: Normal appearance. She is well-developed. She is not toxic-appearing.     Comments: Afebrile, nontoxic, NAD  HENT:     Head: Normocephalic and atraumatic.  Eyes:     General:        Right eye: No discharge.        Left eye: No discharge.     Conjunctiva/sclera: Conjunctivae normal.  Neck:     Musculoskeletal: Normal range of motion and neck supple.  Cardiovascular:     Rate and Rhythm: Normal rate.     Pulses: Normal pulses.  Pulmonary:     Effort: Pulmonary effort is normal. No respiratory distress.  Abdominal:     General: There is no distension.  Musculoskeletal: Normal range of motion.     Comments: R shin wound healing well, no evidence of infection, no areas of tenderness to this wound. SEE PICTURE BELOW L medial lower leg with ~5cm curvilinear laceration/skin tear exposing underlying subQ fat, no retained FBs noted, no ongoing bleeding, mild bruising around the area, no significant swelling, no crepitus or deformity, mild TTP to the area, sensation grossly intact, distal pulses intact, soft compartments. Baseline ROM and strength deficit same as usual per pt. SEE PICTURE BELOW  Skin:    General: Skin is warm and dry.     Findings: Laceration present. No rash.     Comments: L shin lac as mentioned above and pictured below. R shin healing wound as mentioned above and pictured below.   Neurological:     Mental Status: She is alert and oriented to person, place, and time.     Sensory: Sensation is intact. No sensory deficit.     Motor: Motor  function is intact.  Psychiatric:        Mood and Affect: Mood and affect normal.        Behavior: Behavior normal.     L shin wound:   R shin wound:    ED Treatments / Results  Labs (all labs ordered are listed, but only abnormal results are displayed) Labs Reviewed - No data to display  EKG None  Radiology Dg Tibia/fibula Left  Result Date: 02/23/2018 CLINICAL DATA:  Laceration EXAM: LEFT TIBIA AND FIBULA - 2 VIEW COMPARISON:  CT left lower extremity October 22, 2017 FINDINGS: Frontal and lateral views were obtained. There is a total knee replacement with prosthetic components well-seated. There is no acute fracture or dislocation. No abnormal periosteal reaction. No radiopaque foreign body evident. No soft tissue air appreciable. There is moderate arthropathy in the ankle region. IMPRESSION: No acute fracture or dislocation. No radiopaque foreign body. Moderate arthropathy noted in the ankle joint. 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 Electronically Signed   By: Bretta BangWilliam  Woodruff III M.D.   On: 02/23/2018 14:14    Procedures .Marland Kitchen.Laceration Repair Date/Time: 02/23/2018 2:00 PM Performed by: Rhona RaiderStreet, Maxmillian Carsey, PA-C Authorized by: Rhona RaiderStreet, Cruise Baum[place]ner, [place]   Consent:    Consent obtained:  Verbal   Consent given by:  Patient   Risks discussed:  Poor cosmetic result and poor wound healing   Alternatives discussed:  Referral Anesthesia (see MAR for exact dosages):    Anesthesia method:  Local infiltration   Local anesthetic:  Lidocaine 2% WITH epi Laceration details:    Location:  Leg   Leg location:  L lower leg   Length (cm):  5   Depth (mm):  5 Repair type:    Repair type:  Intermediate Pre-procedure details:    Preparation:  Patient was prepped and draped in usual sterile fashion and imaging obtained to evaluate for foreign bodies Exploration:    Hemostasis achieved with:  Direct pressure   Wound extent: no foreign bodies/material  noted, no muscle damage noted and no underlying fracture noted     Contaminated: no   Treatment:    Area cleansed with:  Saline   Amount of cleaning:  Standard   Irrigation solution:  Sterile saline   Irrigation volume:  500mL   Irrigation method:  Syringe Subcutaneous repair:    Suture size:  3-0  Suture material:  Vicryl   Suture technique:  Horizontal mattress   Number of sutures:  5 Skin repair:    Repair method:  Sutures   Suture size:  4-0   Suture material:  Nylon   Suture technique:  Horizontal mattress and simple interrupted   Number of sutures:  13 (10 simple interrupted, 3 horizontal mattress) Approximation:    Approximation:  Close Post-procedure details:    Dressing:  Non-adherent dressing and bulky dressing   Patient tolerance of procedure:  Tolerated well, no immediate complications   (including critical care time)  Medications Ordered in ED Medications  lidocaine-EPINEPHrine (XYLOCAINE W/EPI) 2 %-1:200000 (PF) injection 10 mL (has no administration in time range)     Initial Impression / Assessment and Plan / ED Course  I have reviewed the triage vital signs and the nursing notes.  Pertinent labs & imaging results that were available during my care of the patient were reviewed by me and considered in my medical decision making (see chart for details).     85 y.o. female here with left leg laceration sustained today, states that she cut it on the edge of her mattress which is plastic.  On exam, approximately 5 cm curvilinear laceration to the left medial lower leg, exposing the subcutaneous fat, no obvious retained foreign bodies appreciated, some bruising, mild tenderness, bleeding controlled, extremity neurovascularly intact with soft compartments.  Will obtain x-ray to ensure no retained foreign bodies, tetanus was up-to-date at her last visit on 01/31/2018.  The wound from her right shin from that visit looks great and appears to be healing well.  Doubt  need for intervention of this.  Doubt need for any other labs, will reassess after imaging is completed, and perform wound closure. Discussed case with my attending Dr. Judd Lien who agrees with plan.   3:03 PM Xray neg for acute fx or underlying foreign body. Wound repaired with 5 subQ sutures then 13 total ethilon sutures, adequate hemostasis and cosmesis achieved. Advised proper wound care, RICE, tylenol/motrin for pain, and f/up with PCP  in 7-10 days for suture removal. Will send home on abx for prophylaxis. I explained the diagnosis and have given explicit precautions to return to the ER including for any other new or worsening symptoms. The patient understands and accepts the medical plan as it's been dictated and I have answered their questions. Discharge instructions concerning home care and prescriptions have been given. The patient is STABLE and is discharged to home in good condition.    Final Clinical Impressions(s) / ED Diagnoses   Final diagnoses:  Laceration of left lower extremity, initial encounter    ED Discharge Orders         Ordered    cephALEXin (KEFLEX) 500 MG capsule  2 times daily     02/23/18 [address] [place], [place] 02/23/18 1504    Geoffery Lyons, MD 02/23/18 1524

## 2018-02-23 NOTE — ED Notes (Signed)
Wound irrigate/clean and dressing apply to left leg.

## 2018-02-23 NOTE — Discharge Instructions (Signed)
Keep wound clean with mild soap and water. Keep area covered with a topical antibiotic ointment and bandage, keep bandage dry, and do not submerge in water for 24 hours. Ice and elevate for additional pain and swelling relief. Alternate between Ibuprofen and Tylenol for additional pain relief. Follow up with your primary care doctor or the [facility] in approximately 7-10 days for wound recheck and suture removal. Monitor area for signs of infection to include, but not limited to: increasing pain, spreading redness, drainage/pus, worsening swelling, or fevers. Return to emergency department for emergent changing or worsening symptoms. °  °

## 2018-02-23 NOTE — ED Triage Notes (Signed)
Patient here with c/o left leg laceration that just open up again today. Patient had stiches removes from the same site a few days ago. Patient is on blood thinners eliquis. Bleeding is controlled. Patient have about 3 inches long laceration. Patient is alert and oriented to baseline.

## 2018-06-11 ENCOUNTER — Emergency Department (HOSPITAL_COMMUNITY): Payer: Medicare Other

## 2018-06-11 ENCOUNTER — Inpatient Hospital Stay (HOSPITAL_COMMUNITY)
Admission: EM | Admit: 2018-06-11 | Discharge: 2018-06-21 | DRG: 602 | Disposition: A | Discharge status: Skilled Nursing Facility | Payer: Medicare Other | Source: Skilled Nursing Facility | Attending: Student | Admitting: Student

## 2018-06-11 ENCOUNTER — Other Ambulatory Visit: Payer: Self-pay

## 2018-06-11 ENCOUNTER — Encounter (HOSPITAL_COMMUNITY): Payer: Self-pay

## 2018-06-11 DIAGNOSIS — Z7401 Bed confinement status: Secondary | ICD-10-CM

## 2018-06-11 DIAGNOSIS — M879 Osteonecrosis, unspecified: Secondary | ICD-10-CM | POA: Diagnosis present

## 2018-06-11 DIAGNOSIS — I69392 Facial weakness following cerebral infarction: Secondary | ICD-10-CM

## 2018-06-11 DIAGNOSIS — I4891 Unspecified atrial fibrillation: Secondary | ICD-10-CM | POA: Diagnosis not present

## 2018-06-11 DIAGNOSIS — I4821 Permanent atrial fibrillation: Secondary | ICD-10-CM | POA: Diagnosis present

## 2018-06-11 DIAGNOSIS — G4733 Obstructive sleep apnea (adult) (pediatric): Secondary | ICD-10-CM | POA: Diagnosis present

## 2018-06-11 DIAGNOSIS — N179 Acute kidney failure, unspecified: Secondary | ICD-10-CM | POA: Diagnosis not present

## 2018-06-11 DIAGNOSIS — L02416 Cutaneous abscess of left lower limb: Secondary | ICD-10-CM | POA: Diagnosis present

## 2018-06-11 DIAGNOSIS — F0390 Unspecified dementia without behavioral disturbance: Secondary | ICD-10-CM | POA: Diagnosis present

## 2018-06-11 DIAGNOSIS — I5032 Chronic diastolic (congestive) heart failure: Secondary | ICD-10-CM | POA: Diagnosis present

## 2018-06-11 DIAGNOSIS — E873 Alkalosis: Secondary | ICD-10-CM | POA: Diagnosis present

## 2018-06-11 DIAGNOSIS — I69398 Other sequelae of cerebral infarction: Secondary | ICD-10-CM

## 2018-06-11 DIAGNOSIS — Z7189 Other specified counseling: Secondary | ICD-10-CM

## 2018-06-11 DIAGNOSIS — L89301 Pressure ulcer of unspecified buttock, stage 1: Secondary | ICD-10-CM | POA: Diagnosis present

## 2018-06-11 DIAGNOSIS — Z9981 Dependence on supplemental oxygen: Secondary | ICD-10-CM

## 2018-06-11 DIAGNOSIS — E876 Hypokalemia: Secondary | ICD-10-CM | POA: Diagnosis not present

## 2018-06-11 DIAGNOSIS — Z79899 Other long term (current) drug therapy: Secondary | ICD-10-CM

## 2018-06-11 DIAGNOSIS — J9610 Chronic respiratory failure, unspecified whether with hypoxia or hypercapnia: Secondary | ICD-10-CM | POA: Diagnosis present

## 2018-06-11 DIAGNOSIS — R531 Weakness: Secondary | ICD-10-CM | POA: Diagnosis not present

## 2018-06-11 DIAGNOSIS — I6930 Unspecified sequelae of cerebral infarction: Secondary | ICD-10-CM

## 2018-06-11 DIAGNOSIS — Z1159 Encounter for screening for other viral diseases: Secondary | ICD-10-CM

## 2018-06-11 DIAGNOSIS — Z008 Encounter for other general examination: Secondary | ICD-10-CM

## 2018-06-11 DIAGNOSIS — I272 Pulmonary hypertension, unspecified: Secondary | ICD-10-CM | POA: Diagnosis present

## 2018-06-11 DIAGNOSIS — K5900 Constipation, unspecified: Secondary | ICD-10-CM | POA: Diagnosis present

## 2018-06-11 DIAGNOSIS — L03116 Cellulitis of left lower limb: Principal | ICD-10-CM | POA: Diagnosis present

## 2018-06-11 DIAGNOSIS — I110 Hypertensive heart disease with heart failure: Secondary | ICD-10-CM | POA: Diagnosis present

## 2018-06-11 DIAGNOSIS — I69354 Hemiplegia and hemiparesis following cerebral infarction affecting left non-dominant side: Secondary | ICD-10-CM

## 2018-06-11 DIAGNOSIS — G8929 Other chronic pain: Secondary | ICD-10-CM | POA: Diagnosis present

## 2018-06-11 DIAGNOSIS — Z6837 Body mass index (BMI) 37.0-37.9, adult: Secondary | ICD-10-CM

## 2018-06-11 DIAGNOSIS — E785 Hyperlipidemia, unspecified: Secondary | ICD-10-CM | POA: Diagnosis present

## 2018-06-11 DIAGNOSIS — L89101 Pressure ulcer of unspecified part of back, stage 1: Secondary | ICD-10-CM | POA: Diagnosis present

## 2018-06-11 DIAGNOSIS — Z7901 Long term (current) use of anticoagulants: Secondary | ICD-10-CM

## 2018-06-11 DIAGNOSIS — Z515 Encounter for palliative care: Secondary | ICD-10-CM

## 2018-06-11 DIAGNOSIS — J449 Chronic obstructive pulmonary disease, unspecified: Secondary | ICD-10-CM | POA: Diagnosis present

## 2018-06-11 DIAGNOSIS — F419 Anxiety disorder, unspecified: Secondary | ICD-10-CM | POA: Diagnosis present

## 2018-06-11 DIAGNOSIS — R627 Adult failure to thrive: Secondary | ICD-10-CM

## 2018-06-11 DIAGNOSIS — R001 Bradycardia, unspecified: Secondary | ICD-10-CM | POA: Diagnosis present

## 2018-06-11 DIAGNOSIS — G9341 Metabolic encephalopathy: Secondary | ICD-10-CM | POA: Diagnosis present

## 2018-06-11 DIAGNOSIS — L02419 Cutaneous abscess of limb, unspecified: Secondary | ICD-10-CM

## 2018-06-11 DIAGNOSIS — D696 Thrombocytopenia, unspecified: Secondary | ICD-10-CM | POA: Diagnosis present

## 2018-06-11 LAB — CBC
HCT: 44.3 % (ref 36.0–46.0)
Hemoglobin: 13.7 g/dL (ref 12.0–15.0)
MCH: 25.8 pg — ABNORMAL LOW (ref 26.0–34.0)
MCHC: 30.9 g/dL (ref 30.0–36.0)
MCV: 83.4 fL (ref 80.0–100.0)
Platelets: 149 10*3/uL — ABNORMAL LOW (ref 150–400)
RBC: 5.31 MIL/uL — ABNORMAL HIGH (ref 3.87–5.11)
RDW: 17.2 % — ABNORMAL HIGH (ref 11.5–15.5)
WBC: 11 10*3/uL — ABNORMAL HIGH (ref 4.0–10.5)
nRBC: 0 % (ref 0.0–0.2)

## 2018-06-11 LAB — DIFFERENTIAL
Abs Immature Granulocytes: 0.05 10*3/uL (ref 0.00–0.07)
Basophils Absolute: 0 10*3/uL (ref 0.0–0.1)
Basophils Relative: 0 %
Eosinophils Absolute: 0.3 10*3/uL (ref 0.0–0.5)
Eosinophils Relative: 3 %
Immature Granulocytes: 1 %
Lymphocytes Relative: 19 %
Lymphs Abs: 2.1 10*3/uL (ref 0.7–4.0)
Monocytes Absolute: 0.8 10*3/uL (ref 0.1–1.0)
Monocytes Relative: 7 %
Neutro Abs: 7.8 10*3/uL — ABNORMAL HIGH (ref 1.7–7.7)
Neutrophils Relative %: 70 %

## 2018-06-11 LAB — COMPREHENSIVE METABOLIC PANEL
ALT: 22 U/L (ref 0–44)
AST: 31 U/L (ref 15–41)
Albumin: 3.3 g/dL — ABNORMAL LOW (ref 3.5–5.0)
Alkaline Phosphatase: 128 U/L — ABNORMAL HIGH (ref 38–126)
Anion gap: 12 (ref 5–15)
BUN: 46 mg/dL — ABNORMAL HIGH (ref 8–23)
CO2: 28 mmol/L (ref 22–32)
Calcium: 9.7 mg/dL (ref 8.9–10.3)
Chloride: 101 mmol/L (ref 98–111)
Creatinine, Ser: 1.15 mg/dL — ABNORMAL HIGH (ref 0.44–1.00)
GFR calc Af Amer: 51 mL/min — ABNORMAL LOW (ref >60)
GFR calc non Af Amer: 44 mL/min — ABNORMAL LOW (ref >60)
Glucose, Bld: 77 mg/dL (ref 70–99)
Potassium: 4.2 mmol/L (ref 3.5–5.1)
Sodium: 141 mmol/L (ref 135–145)
Total Bilirubin: 0.9 mg/dL (ref 0.3–1.2)
Total Protein: 7.1 g/dL (ref 6.5–8.1)

## 2018-06-11 LAB — URINALYSIS, ROUTINE W REFLEX MICROSCOPIC
Bilirubin Urine: NEGATIVE
Glucose, UA: NEGATIVE mg/dL
Hgb urine dipstick: NEGATIVE
Ketones, ur: NEGATIVE mg/dL
Leukocytes,Ua: NEGATIVE
Nitrite: NEGATIVE
Protein, ur: NEGATIVE mg/dL
Specific Gravity, Urine: 1.01 (ref 1.005–1.030)
pH: 7 (ref 5.0–8.0)

## 2018-06-11 LAB — LACTIC ACID, PLASMA
Lactic Acid, Venous: 0.9 mmol/L (ref 0.5–1.9)
Lactic Acid, Venous: 1 mmol/L (ref 0.5–1.9)

## 2018-06-11 LAB — APTT: aPTT: 63 seconds — ABNORMAL HIGH (ref 24–36)

## 2018-06-11 LAB — I-STAT CREATININE, ED: Creatinine, Ser: 1.2 mg/dL — ABNORMAL HIGH (ref 0.44–1.00)

## 2018-06-11 LAB — PROTIME-INR
INR: 2.1 — ABNORMAL HIGH (ref 0.8–1.2)
Prothrombin Time: 23.2 seconds — ABNORMAL HIGH (ref 11.4–15.2)

## 2018-06-11 LAB — SARS CORONAVIRUS 2 BY RT PCR (HOSPITAL ORDER, PERFORMED IN ~~LOC~~ HOSPITAL LAB): SARS Coronavirus 2: NEGATIVE

## 2018-06-11 MED ORDER — ALBUTEROL SULFATE (2.5 MG/3ML) 0.083% IN NEBU: 2.5000 mg | INHALATION_SOLUTION | RESPIRATORY_TRACT | Status: DC | PRN

## 2018-06-11 MED ORDER — VANCOMYCIN HCL IN DEXTROSE 1-5 GM/200ML-% IV SOLN
1000.0000 mg | Freq: Once | INTRAVENOUS | Status: DC
  Filled 2018-06-11: qty 200

## 2018-06-11 MED ORDER — ATORVASTATIN CALCIUM 40 MG PO TABS
40.0000 mg | ORAL_TABLET | Freq: Every day | ORAL | Status: DC
  Administered 2018-06-11 – 2018-06-20 (×9): 40 mg via ORAL
  Filled 2018-06-11 (×9): qty 1

## 2018-06-11 MED ORDER — PREGABALIN 25 MG PO CAPS
25.0000 mg | ORAL_CAPSULE | Freq: Three times a day (TID) | ORAL | Status: DC
  Administered 2018-06-12 – 2018-06-21 (×26): 25 mg via ORAL
  Filled 2018-06-11 (×29): qty 1

## 2018-06-11 MED ORDER — SODIUM CHLORIDE 0.9 % IV BOLUS
500.0000 mL | Freq: Once | INTRAVENOUS | Status: AC
  Administered 2018-06-11: 500 mL via INTRAVENOUS

## 2018-06-11 MED ORDER — PANTOPRAZOLE SODIUM 40 MG PO TBEC
40.0000 mg | DELAYED_RELEASE_TABLET | Freq: Every day | ORAL | Status: DC
  Administered 2018-06-11 – 2018-06-21 (×7): 40 mg via ORAL
  Filled 2018-06-11 (×10): qty 1

## 2018-06-11 MED ORDER — OXYCODONE HCL 5 MG PO TABS
5.0000 mg | ORAL_TABLET | ORAL | Status: DC | PRN
  Administered 2018-06-13 – 2018-06-19 (×8): 5 mg via ORAL
  Filled 2018-06-11 (×9): qty 1

## 2018-06-11 MED ORDER — ONDANSETRON HCL 4 MG/2ML IJ SOLN
4.0000 mg | Freq: Four times a day (QID) | INTRAMUSCULAR | Status: DC | PRN
  Filled 2018-06-11: qty 2

## 2018-06-11 MED ORDER — SODIUM CHLORIDE 0.9 % IV SOLN
INTRAVENOUS | Status: DC
  Administered 2018-06-11: 15:00:00 via INTRAVENOUS

## 2018-06-11 MED ORDER — VANCOMYCIN HCL 10 G IV SOLR
1500.0000 mg | INTRAVENOUS | Status: DC
  Administered 2018-06-13 – 2018-06-15 (×2): 1500 mg via INTRAVENOUS
  Filled 2018-06-11 (×4): qty 1500

## 2018-06-11 MED ORDER — ONDANSETRON HCL 4 MG PO TABS: 4.0000 mg | ORAL_TABLET | Freq: Four times a day (QID) | ORAL | Status: DC | PRN

## 2018-06-11 MED ORDER — PHENOL 1.4 % MT LIQD: 2.0000 | OROMUCOSAL | Status: DC | PRN

## 2018-06-11 MED ORDER — CALCIUM CARBONATE ANTACID 500 MG PO CHEW
1.0000 | CHEWABLE_TABLET | Freq: Three times a day (TID) | ORAL | Status: DC
  Administered 2018-06-11 – 2018-06-21 (×24): 200 mg via ORAL
  Filled 2018-06-11 (×26): qty 1

## 2018-06-11 MED ORDER — ACETAMINOPHEN 325 MG PO TABS
650.0000 mg | ORAL_TABLET | Freq: Four times a day (QID) | ORAL | Status: DC | PRN
  Administered 2018-06-19: 650 mg via ORAL
  Filled 2018-06-11 (×3): qty 2

## 2018-06-11 MED ORDER — CEFAZOLIN SODIUM-DEXTROSE 1-4 GM/50ML-% IV SOLN
1.0000 g | Freq: Once | INTRAVENOUS | Status: AC
  Administered 2018-06-11: 1 g via INTRAVENOUS
  Filled 2018-06-11: qty 50

## 2018-06-11 MED ORDER — IOHEXOL 300 MG/ML  SOLN
100.0000 mL | Freq: Once | INTRAMUSCULAR | Status: AC | PRN
  Administered 2018-06-11: 100 mL via INTRAVENOUS

## 2018-06-11 MED ORDER — CHLORHEXIDINE GLUCONATE 0.12 % MT SOLN
15.0000 mL | Freq: Two times a day (BID) | OROMUCOSAL | Status: DC
  Administered 2018-06-11 – 2018-06-21 (×17): 15 mL via OROMUCOSAL
  Filled 2018-06-11 (×18): qty 15

## 2018-06-11 MED ORDER — VANCOMYCIN HCL 10 G IV SOLR
1750.0000 mg | Freq: Once | INTRAVENOUS | Status: AC
  Administered 2018-06-11: 1750 mg via INTRAVENOUS
  Filled 2018-06-11: qty 1750

## 2018-06-11 MED ORDER — CEFAZOLIN SODIUM-DEXTROSE 1-4 GM/50ML-% IV SOLN
1.0000 g | Freq: Three times a day (TID) | INTRAVENOUS | Status: DC
  Administered 2018-06-11 – 2018-06-18 (×21): 1 g via INTRAVENOUS
  Filled 2018-06-11 (×24): qty 50

## 2018-06-11 MED ORDER — SORBITOL 70 % SOLN
960.0000 mL | TOPICAL_OIL | Freq: Once | ORAL | Status: AC
  Administered 2018-06-11: 960 mL via RECTAL
  Filled 2018-06-11 (×2): qty 473

## 2018-06-11 MED ORDER — ACETAMINOPHEN 650 MG RE SUPP: 650.0000 mg | Freq: Four times a day (QID) | RECTAL | Status: DC | PRN

## 2018-06-11 MED ORDER — POLYETHYLENE GLYCOL 3350 17 G PO PACK: 17.0000 g | PACK | Freq: Every day | ORAL | Status: DC | PRN

## 2018-06-11 MED ORDER — SODIUM CHLORIDE 0.9% FLUSH
3.0000 mL | Freq: Two times a day (BID) | INTRAVENOUS | Status: DC
  Administered 2018-06-11 – 2018-06-20 (×13): 3 mL via INTRAVENOUS

## 2018-06-11 NOTE — ED Notes (Signed)
Updated pt Niece that pt is being admitted for Cellulitis.  Gave her the main hospital number and advised her to feel free to text me with any questions or for updates while she is in the emergency department.

## 2018-06-11 NOTE — ED Notes (Signed)
Dr. Smith into see pt.

## 2018-06-11 NOTE — Consult Note (Signed)
[facility] Surgery Consult Note  Margaret Collier Jun 20, 1931  [id number].    Requesting MD: Madelyn Flavors Chief Complaint/Reason for Consult: thigh abscess  HPI:  Margaret Collier  is an 85yo female PMH A fib on eliquis, h/o CVA with residual left sided weakness, HLD, CHF, and OSA on 2L O2 [place] at night, who was brought to [facility] from SNF with increased confusion and worsening left sided weakness/facial droop. Acute stroke workup negative. Patient was found to have a 3.5 cm complex lesion with thick rim enhancement in the superficial medial aspect of the left mid thigh suspicious for abscess, or less likely necrotic mass. WBC 11, Cr 1.15. Patient thinks it has been present for at least 3 days. States that it is painful. Worse with palpation. Denies drainage from this area. She was started on keflex at SNF 2 days ago with no improvement.   ROS: Review of Systems  Constitutional: Negative for chills and fever.  HENT: Positive for hearing loss.   Eyes: Negative.   Respiratory: Positive for shortness of breath. Negative for cough and wheezing.   Cardiovascular: Positive for leg swelling. Negative for chest pain.  Gastrointestinal: Positive for constipation. Negative for abdominal pain, nausea and vomiting.  Genitourinary: Negative.  Negative for dysuria.  Musculoskeletal: Positive for back pain and joint pain. Negative for falls.  Skin: Positive for rash.       LLE cellulitis  Neurological: Positive for weakness.   All systems reviewed and otherwise negative except for as above  Family History  Problem Relation Age of Onset  . Heart disease Mother     Past Medical History:  Diagnosis Date  . Atrial fibrillation (HCC)   . Hyperlipidemia   . Hypertension   . OSA (obstructive sleep apnea)     History reviewed. No pertinent surgical history.  Social History:  reports that she has never smoked. She has never used smokeless tobacco. No history on file for alcohol and  drug.  Allergies:  Allergies  Allergen Reactions  . Zanaflex [Tizanidine Hcl] Shortness Of Breath, Other (See Comments) and Cough    Dehydration - reported by [facility] 10/07/13  . Alprazolam Other (See Comments)    Dry mouth - reported by [facility] 05/13/14   . Carisoprodol Other (See Comments)    Dizziness - reported by [facility] 05/13/14   . Chlorthalidone Other (See Comments)    syncope  . Colesevelam Other (See Comments)    GI upset and stool change - reported by [facility] 05/13/14   . Dilaudid [Hydromorphone Hcl] Hives and Swelling  . Duloxetine Other (See Comments)    Dizziness - reported by [facility] 05/13/14   . Felodipine Other (See Comments)    Dizziness, groin pain - reported by [facility] 05/13/14   . Fosamax [Alendronate Sodium] Nausea And Vomiting and Other (See Comments)    Back and hip pain  . Furosemide Other (See Comments)    Dry mouth/dizziness - reported by [facility] 05/13/14   . Gabapentin Other (See Comments)    Insomnia - reported by [facility] 05/13/14   . Ibuprofen Other (See Comments)    GI pain - reported by [facility] 05/13/14   . Lovastatin Other (See Comments)    Weakness - reported by [facility] 05/13/14   . Meclizine Other (See Comments)    Sleeplessness - reported by [facility] 05/13/14   . Meperidine Other (See Comments)  Jitteriness - reported by [facility] 05/13/14   . Methadone Other (See Comments)    Stool impaction - reported by [facility] 05/13/14   . Morphine Other (See Comments)    Dizziness - [facility] 05/13/[facility]   . Niacin Other (See Comments)    Flushing - reported [facility] 05/13/14   . Omeprazole Other (See Comments)    Sleeplessness - reported by [facility] 05/13/14   . Oxycodone Other (See Comments)    Groin pain - [facility] 05/13/14   . Pravastatin Other (See Comments)    Weakness - reported [facility] 05/13/14    . Simvastatin Other (See Comments)    Weakness - [facility] 05/13/[facility]   . Statins Other (See Comments)    Myalgia, weakness - reported by [facility] 05/13/14  . Tramadol Other (See Comments)    Nervous - reported by [facility] 05/13/14   . Valsartan Other (See Comments)    Dizziness - reported by [facility] 05/13/14   . Hydrocodone-Acetaminophen Nausea And Vomiting    reported by [facility] 05/13/14     (Not in a hospital admission)   Prior to Admission medications   Medication Sig Start Date End Date Taking? Authorizing Provider  albuterol (VENTOLIN HFA) 108 (90 Base) MCG/ACT inhaler Inhale 2 puffs into the lungs every 4 (four) hours as needed for wheezing or shortness of breath.   Yes [provider]  apixaban (ELIQUIS) 5 MG TABS tablet Take 1 tablet (5 mg total) by mouth 2 (two) times daily. 01/28/18 06/11/18 Yes Jerald Kiefhiu, Stephen K, MD  atorvastatin (LIPITOR) 40 MG tablet Take 40 mg by mouth daily at 6 PM.    Yes [provider]  b complex vitamins capsule Take 1 capsule by mouth daily.   Yes [provider]  calcium carbonate (TUMS - DOSED IN MG ELEMENTAL CALCIUM) 500 MG chewable tablet Chew 1 tablet by mouth 3 (three) times daily before meals.   Yes [provider]  cephALEXin (KEFLEX) 500 MG capsule Take 500 mg by mouth every 8 (eight) hours.   Yes [provider]  chlorhexidine (PERIDEX) 0.12 % solution Use as directed 15 mLs in the mouth or throat 2 (two) times daily.   Yes [provider]  cholecalciferol (VITAMIN D3) 25 MCG (1000 UT) tablet Take 1,000 Units by mouth daily.   Yes [provider]  furosemide (LASIX) 40 MG tablet Take 40 mg by mouth daily.    Yes [provider]  furosemide (LASIX) 80 MG tablet Take 1 tablet (80 mg total) by mouth daily. Patient taking differently: Take 20 mg by mouth as needed for fluid or edema. May have every day PRN for wt gain of 3 lbs in one day  or 5 lbs in a week 01/28/18 06/11/18 Yes Jerald Kiefhiu, Stephen K, MD  oxyCODONE-acetaminophen (PERCOCET) 10-325 MG tablet Take 1 tablet by mouth every 12 (twelve) hours. 01/28/18  Yes Jerald Kiefhiu, Stephen K, MD  OXYGEN Inhale 2 L/min into the lungs See admin instructions. Apply via nasal canula daily at bedtime and remove every morning   Yes [provider]  pantoprazole (PROTONIX) 40 MG tablet Take 40 mg by mouth daily at 6 (six) AM.    Yes [provider]  phenol (CHLORASEPTIC) 1.4 % LIQD Use as directed 2 sprays in the mouth or throat every 2 (two) hours as  needed for throat irritation / pain. x48   Yes [provider]  polyethylene glycol (MIRALAX / GLYCOLAX) packet Take 17 g by mouth daily as needed for moderate constipation. 01/28/18  Yes Jerald Kief, MD  potassium chloride SA (K-DUR,KLOR-CON) 20 MEQ tablet Take 20 mEq by mouth daily.   Yes [provider]  pregabalin (LYRICA) 25 MG capsule Take 25 mg by mouth every 8 (eight) hours.    Yes [provider]  spironolactone (ALDACTONE) 25 MG tablet Take 0.5 tablets (12.5 mg total) by mouth daily. 01/29/18 06/11/18 Yes Jerald Kief, MD    Blood pressure 133/67, pulse (!) 42, resp. rate 20, height 5' (1.524 m), weight 83.9 kg, SpO2 96 %. Physical Exam: General: elderly white female who is laying in bed in NAD HEENT: head is normocephalic, atraumatic.  Sclera are noninjected.  Pupils equal and round.  Ears and nose without any masses or lesions.  Mouth is pink and moist. Dentition fair Heart: irregularly irregular.  No obvious murmurs, gallops, or rubs noted.  Palpable pedal pulses bilaterally Lungs: CTAB, no wheezes, rhonchi, or rales noted.  Respiratory effort nonlabored on supplemental O2 Abd: soft, NT/ND, +BS, no masses, hernias, or organomegaly MS: 1+ pitting edema BLE. Chronic weakness LUE and LLE with LUE contracture Psych: Alert and oriented to person, time, place;  Neuro: normal speech Skin: warm and  dry. Left thigh with diffuse warmth, erythema and tenderness at the mid/medial aspect, some central fluctuance, no active draining  Results for orders placed or performed during the hospital encounter of 06/11/18 (from the past 48 hour(s))  Protime-INR     Status: Abnormal   Collection Time: 06/11/18  9:18 AM  Result Value Ref Range   Prothrombin Time 23.2 (H) 11.4 - 15.2 seconds   INR 2.1 (H) 0.8 - 1.2    Comment: (NOTE) INR goal varies based on device and disease states. Performed at [facility], [address] [place], [place] [address]   APTT     Status: Abnormal   Collection Time: 06/11/18  9:18 AM  Result Value Ref Range   aPTT 63 (H) 24 - 36 seconds    Comment:        IF BASELINE aPTT IS ELEVATED, SUGGEST PATIENT RISK ASSESSMENT BE USED TO DETERMINE APPROPRIATE ANTICOAGULANT THERAPY. Performed at [facility], [address] [place], [place] [address]   CBC     Status: Abnormal   Collection Time: 06/11/18  9:18 AM  Result Value Ref Range   WBC 11.0 (H) 4.0 - 10.5 K/uL   RBC 5.31 (H) 3.87 - 5.11 MIL/uL   Hemoglobin 13.7 12.0 - 15.0 g/dL   HCT 64.4 03.4 - 74.2 %   MCV 83.4 80.0 - 100.0 fL   MCH 25.8 (L) 26.0 - 34.0 pg   MCHC 30.9 30.0 - 36.0 g/dL   RDW 59.5 (H) 63.8 - 75.6 %   Platelets 149 (L) 150 - 400 K/uL    Comment: REPEATED TO VERIFY   nRBC 0.0 0.0 - 0.2 %    Comment: Performed at [facility], [address], [place], [place] [address]  Differential     Status: Abnormal   Collection Time: 06/11/18  9:18 AM  Result Value Ref Range   Neutrophils Relative % 70 %   Neutro Abs 7.8 (H) 1.7 - 7.7 K/uL   Lymphocytes Relative 19 %   Lymphs Abs 2.1 0.7 - 4.0 K/uL   Monocytes Relative 7 %  Monocytes Absolute 0.8 0.1 - 1.0 K/uL   Eosinophils Relative 3 %   Eosinophils Absolute 0.3 0.0 - 0.5 K/uL   Basophils Relative 0 %   Basophils Absolute 0.0 0.0 - 0.1 K/uL   Immature Granulocytes 1 %   Abs Immature Granulocytes 0.05 0.00 - 0.07 K/uL     Comment: Performed at [facility], [address] [place], [place] [address]  Comprehensive metabolic panel     Status: Abnormal   Collection Time: 06/11/18  9:18 AM  Result Value Ref Range   Sodium 141 135 - 145 mmol/L   Potassium 4.2 3.5 - 5.1 mmol/L   Chloride 101 98 - 111 mmol/L   CO2 28 22 - 32 mmol/L   Glucose, Bld 77 70 - 99 mg/dL   BUN 46 (H) 8 - 23 mg/dL   Creatinine, Ser 6.04 (H) 0.44 - 1.00 mg/dL   Calcium 9.7 8.9 - 54.0 mg/dL   Total Protein 7.1 6.5 - 8.1 g/dL   Albumin 3.3 (L) 3.5 - 5.0 g/dL   AST 31 15 - 41 U/L   ALT 22 0 - 44 U/L   Alkaline Phosphatase 128 (H) 38 - 126 U/L   Total Bilirubin 0.9 0.3 - 1.2 mg/dL   GFR calc non Af Amer 44 (L) >60 mL/min   GFR calc Af Amer 51 (L) >60 mL/min   Anion gap 12 5 - 15    Comment: Performed at [facility], [address], [place], [place] [address]  Urinalysis, Routine w reflex microscopic     Status: None   Collection Time: 06/11/18  9:18 AM  Result Value Ref Range   Color, Urine YELLOW YELLOW   APPearance CLEAR CLEAR   Specific Gravity, Urine 1.010 1.005 - 1.030   pH 7.0 5.0 - 8.0   Glucose, UA NEGATIVE NEGATIVE mg/dL   Hgb urine dipstick NEGATIVE NEGATIVE   Bilirubin Urine NEGATIVE NEGATIVE   Ketones, ur NEGATIVE NEGATIVE mg/dL   Protein, ur NEGATIVE NEGATIVE mg/dL   Nitrite NEGATIVE NEGATIVE   Leukocytes,Ua NEGATIVE NEGATIVE    Comment: Microscopic not done on urines with negative protein, blood, leukocytes, nitrite, or glucose < 500 mg/dL. Performed at [facility], [address], [place], [place] [address]   Lactic acid, plasma     Status: None   Collection Time: 06/11/18  9:18 AM  Result Value Ref Range   Lactic Acid, Venous 0.9 0.5 - 1.9 mmol/L    Comment: Performed at [facility], [address] [place], [place] [address]  SARS Coronavirus 2 (CEPHEID - Performed in [facility] hospital lab), Hosp Order     Status: None   Collection Time: 06/11/18  9:18 AM  Result Value Ref Range    SARS Coronavirus 2 NEGATIVE NEGATIVE    Comment: (NOTE) If result is NEGATIVE SARS-CoV-2 target nucleic acids are NOT DETECTED. The SARS-CoV-2 RNA is generally detectable in upper and lower  respiratory specimens during the acute phase of infection. The lowest  concentration of SARS-CoV-2 viral copies this assay can detect is 250  copies / mL. A negative result does not preclude SARS-CoV-2 infection  and should not be used as the sole basis for treatment or other  patient management decisions.  A negative result may occur with  improper specimen collection / handling, submission of specimen other  than nasopharyngeal swab, presence of viral mutation(s) within the  areas targeted by this assay, and inadequate number of viral copies  (<  250 copies / mL). A negative result must be combined with clinical  observations, patient history, and epidemiological information. If result is POSITIVE SARS-CoV-2 target nucleic acids are DETECTED. The SARS-CoV-2 RNA is generally detectable in upper and lower  respiratory specimens dur ing the acute phase of infection.  Positive  results are indicative of active infection with SARS-CoV-2.  Clinical  correlation with patient history and other diagnostic information is  necessary to determine patient infection status.  Positive results do  not rule out bacterial infection or co-infection with other viruses. If result is PRESUMPTIVE POSTIVE SARS-CoV-2 nucleic acids MAY BE PRESENT.   A presumptive positive result was obtained on the submitted specimen  and confirmed on repeat testing.  While 2019 novel coronavirus  (SARS-CoV-2) nucleic acids may be present in the submitted sample  additional confirmatory testing may be necessary for epidemiological  and / or clinical management purposes  to differentiate between  SARS-CoV-2 and other Sarbecovirus currently known to infect humans.  If clinically indicated additional testing with an alternate test   methodology [phone]) is advised. The SARS-CoV-2 RNA is generally  detectable in upper and lower respiratory sp ecimens during the acute  phase of infection. The expected result is Negative. Fact Sheet for Patients:  BoilerBrush.com.cy Fact Sheet for Healthcare Providers: https://pope.com/ This test is not yet approved or cleared by the [place] FDA and has been authorized for detection and/or diagnosis of SARS-CoV-2 by FDA under an Emergency Use Authorization (EUA).  This EUA will remain in effect (meaning this test can be used) for the duration of the COVID-19 declaration under Section 564(b)(1) of the Act, 21 U.S.C. section 360bbb-3(b)(1), unless the authorization is terminated or revoked sooner. Performed at [facility], [address] [place], [place] [address]   I-stat Creatinine, ED     Status: Abnormal   Collection Time: 06/11/18  9:29 AM  Result Value Ref Range   Creatinine, Ser 1.20 (H) 0.44 - 1.00 mg/dL  Lactic acid, plasma     Status: None   Collection Time: 06/11/18 11:13 AM  Result Value Ref Range   Lactic Acid, Venous 1.0 0.5 - 1.9 mmol/L    Comment: Performed at [facility], [address] [place], [place] [address]   Ct Head Wo Contrast  Result Date: 06/11/2018 CLINICAL DATA:  Left-sided weakness. Left-sided facial droop. Worsened confusion. EXAM: CT HEAD WITHOUT CONTRAST TECHNIQUE: Contiguous axial images were obtained from the base of the skull through the vertex without intravenous contrast. COMPARISON:  10/05/2017 FINDINGS: Brain: The brainstem and cerebellum are unremarkable. Left cerebral hemisphere shows minimal small vessel change of the white matter. On the right, there has been old infarction in the right basal ganglia and insula which has progressed to atrophy, encephalomalacia and gliosis. There is old appearing infarction in the right parietal lobe and temporoparietal junction, which was  not visibly affected in August of last year. This does not appear acute however. There is no identifiable acute infarction. No mass lesion, hemorrhage, hydrocephalus or extra-axial collection. Vascular: There is atherosclerotic calcification of the major vessels at the base of the brain. Skull: Negative Sinuses/Orbits: Some fluid layering in the right division of the sphenoid sinus. Other sinuses are clear. Orbits are negative. 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 The area of involvement is more extensive than was seen in August of last year, with more involvement of the temporoparietal junction and posterior parietal lobe, but  the findings do not look acute today. No hemorrhage or mass effect. Electronically Signed   By: Paulina Fusi M.D.   On: 06/11/2018 10:17   Ct Femur Left W Contrast  Result Date: 06/11/2018 CLINICAL DATA:  Left thigh swelling and pain. Concern for soft tissue infection. EXAM: CT OF THE LOWER RIGHT EXTREMITY WITH CONTRAST TECHNIQUE: Multidetector CT imaging of the lower right extremity was performed according to the standard protocol following intravenous contrast administration. COMPARISON:  Left femur x-rays from same day. CONTRAST:  OMNIPAQUE IOHEXOL 300 MG/ML  SOLN FINDINGS: Bones/Joint/Cartilage No acute fracture or dislocation. No osseous destruction or periosteal reaction. Sclerosis within the left femoral head. Moderate left hip joint space narrowing. Prior left total knee arthroplasty. No evidence of hardware failure or loosening. No hip or knee joint effusion. Ligaments Suboptimally assessed by CT. Muscles and Tendons Grossly intact. Severe left gluteus medius muscle atrophy. Unchanged small lipoma in the proximal lateral gastrocnemius muscle. Soft tissues In the superficial medial aspect of the left mid thigh, there is a 3.5 x 2.0 by 1.8 cm complex, predominantly low-density lesion with thick rim  enhancement. There is prominent overlying skin thickening of the medial thigh and mild surrounding fat stranding. No subcutaneous emphysema. Large stool ball in the rectum.  No lymphadenopathy. IMPRESSION: 1. 3.5 cm complex, predominantly low-density lesion with thick rim enhancement in the superficial medial aspect of the left mid thigh. Appearance is most suggestive of an abscess given overlying skin thickening and surrounding soft tissue inflammatory changes suggestive of cellulitis, however a necrotic mass could have a similar appearance. 2. No subcutaneous emphysema or deeper soft tissue infection. 3. 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 Electronically Signed   By: Obie Dredge M.D.   On: 06/11/2018 11:55   Dg Femur Portable Min 2 Views Left  Result Date: 06/11/2018 CLINICAL DATA:  Left medial thigh infection. EXAM: LEFT FEMUR PORTABLE 2 VIEWS COMPARISON:  None. FINDINGS: One-view does not show any bone abnormality or radiopaque foreign object. IMPRESSION: No specific or significant finding on this one view examination. Electronically Signed   By: Paulina Fusi M.D.   On: 06/11/2018 09:58   Anti-infectives (From admission, onward)   Start     Dose/Rate Route Frequency Ordered Stop   06/13/18 1200  vancomycin (VANCOCIN) 1,500 mg in sodium chloride 0.9 % 500 mL IVPB     1,500 mg 250 mL/hr over 120 Minutes Intravenous Every 48 hours 06/11/18 1233     06/11/18 2200  ceFAZolin (ANCEF) IVPB 1 g/50 mL premix     1 g 100 mL/hr over 30 Minutes Intravenous Every 8 hours 06/11/18 1335     06/11/18 1300  vancomycin (VANCOCIN) 1,750 mg in sodium chloride 0.9 % 500 mL IVPB     1,750 mg 250 mL/hr over 120 Minutes Intravenous  Once 06/11/18 1233     06/11/18 1215  ceFAZolin (ANCEF) IVPB 1 g/50 mL premix     1 g 100 mL/hr over 30 Minutes Intravenous  Once 06/11/18 1212 06/11/18 1300   06/11/18 1215  vancomycin (VANCOCIN) IVPB 1000 mg/200 mL  premix  Status:  Discontinued     1,000 mg 200 mL/hr over 60 Minutes Intravenous  Once 06/11/18 1212 06/11/18 1233        Assessment/Plan A fib on eliquis H/o CVA with residual left sided weakness HLD CHF OSA on 2L O2  at night Bedbound  Left thigh abscess - Patient with a 3.5 cm  abscess mid left medial thigh with diffuse cellulitis. May require I&D but she is currently on eliquis. Agree with medical admission, broad spectrum antibiotics, and hold eliquis for possible procedure in a couple days once this medication is out of her system. Will continue to follow.  ID - ancef/vancomycin 5/6>> VTE - SCDs, ok for lovenox, sq heparin, or IV heparin (whichever is needed) from surgical standpoint but please hold eliquis FEN - HH diet Foley - none Follow up - TBD  Franne Forts, [facility] 06/11/2018, 2:28 PM Pager: [phone] Mon-Thurs 7:00 am-4:30 pm Fri 7:00 am -11:30 AM Sat-Sun 7:00 am-11:30 am

## 2018-06-11 NOTE — ED Notes (Signed)
Pt passed the swallow screen with no difficulties.

## 2018-06-11 NOTE — ED Notes (Addendum)
Nurse Navigator called Pt Niece Margaret Collier and updated her to her aunt being here with [place] and the tests that have resulted.  Niece states that pt has had to cancel her outpatient echo due to shelter in place (niece is not allowed to pick her up at [facility] to take her for this test).  Niece asks if it would be possible for pt to have echo here while in the Hospital.   Niece speaks with pt.  Pt is alert and oriented.   Niece requests call back from primary nurse, will check with EDP about plan of care prior to call back.

## 2018-06-11 NOTE — ED Notes (Signed)
ED TO INPATIENT HANDOFF REPORT  ED Nurse Name and Phone #:  Gabriel Rung, [place] 1610   S Name/Age/Gender Margaret Collier 85 y.o. female Room/Bed: 024C/024C  Code Status   Code Status: Full Code  Home/SNF/Other Skilled nursing facility Patient oriented to: self, place and situation Is this baseline? Yes   Triage Complete: Triage complete  Chief Complaint stroke sx  Triage Note Pt resides in [facility] facility & was found by staff to have stroke-like symptoms, more than her usual baseline. She has a Lt sided deficit that is residual from past strokes, as well as a Lt sided facial droop. She was said to be more confused than usual as well.    Allergies Allergies  Allergen Reactions  . Zanaflex [Tizanidine Hcl] Shortness Of Breath, Other (See Comments) and Cough    Dehydration - reported by [facility] 10/07/13  . Alprazolam Other (See Comments)    Dry mouth - reported by [facility] 05/13/14   . Carisoprodol Other (See Comments)    Dizziness - reported by [facility] 05/13/14   . Chlorthalidone Other (See Comments)    syncope  . Colesevelam Other (See Comments)    GI upset and stool change - reported by [facility] 05/13/14   . Dilaudid [Hydromorphone Hcl] Hives and Swelling  . Duloxetine Other (See Comments)    Dizziness - reported by [facility] 05/13/14   . Felodipine Other (See Comments)    Dizziness, groin pain - reported by [facility] 05/13/14   . Fosamax [Alendronate Sodium] Nausea And Vomiting and Other (See Comments)    Back and hip pain  . Furosemide Other (See Comments)    Dry mouth/dizziness - reported by [facility] 05/13/14   . Gabapentin Other (See Comments)    Insomnia - reported by [facility] 05/13/14   . Ibuprofen Other (See Comments)    GI pain - reported by [facility] 05/13/14   . Lovastatin Other (See Comments)    Weakness - reported by [facility] 05/13/14   . Meclizine Other (See Comments)    Sleeplessness -  reported by [facility] 05/13/14   . Meperidine Other (See Comments)    Jitteriness - reported by [facility] 05/13/14   . Methadone Other (See Comments)    Stool impaction - reported by [facility] 05/13/14   . Morphine Other (See Comments)    Dizziness - reported by [facility] 05/13/14   . Niacin Other (See Comments)    Flushing - reported by [facility] 05/13/14   . Omeprazole Other (See Comments)    Sleeplessness - reported by [facility] 05/13/14   . Oxycodone Other (See Comments)    Groin pain - reported by [facility] 05/13/14   . Pravastatin Other (See Comments)    Weakness - reported by [facility] 05/13/14   . Simvastatin Other (See Comments)    Weakness - reported by [facility] 05/13/14   . Statins Other (See Comments)    Myalgia, weakness - reported by [facility] 05/13/14  . Tramadol Other (See Comments)    Nervous - reported by [facility] 05/13/14   . Valsartan Other (See Comments)    Dizziness - reported by [facility] 05/13/14   . Hydrocodone-Acetaminophen Nausea And Vomiting    reported by [facility] 05/13/14     Level of Care/Admitting Diagnosis  ED Disposition    ED Disposition Condition Comment   Admit  Hospital Area: [facility] [[id number]]  Level of Care: Telemetry Cardiac [103]  I expect the patient will be discharged within 24 hours: No (not a candidate for 5C-Observation unit)  Covid Evaluation: N/A  Diagnosis: Cellulitis and abscess of left leg [[id number]]  Admitting Physician: Clydie Braun [[id number]]  Attending Physician: Clydie Braun [[id number]]  PT Class (Do Not Modify): Observation [104]  PT Acc Code (Do Not Modify): Observation [10022]       B Medical/Surgery History Past Medical History:  Diagnosis Date  . Atrial fibrillation (HCC)   . Hyperlipidemia   . Hypertension   . OSA (obstructive sleep apnea)    History reviewed. No pertinent surgical history.   A IV  Location/Drains/Wounds Patient Lines/Drains/Airways Status   Active Line/Drains/Airways    Name:   Placement date:   Placement time:   Site:   Days:   Peripheral IV 06/11/18 Right Antecubital   06/11/18    0958    Antecubital   less than 1          Intake/Output Last 24 hours No intake or output data in the 24 hours ending 06/11/18 1409  Labs/Imaging Results for orders placed or performed during the hospital encounter of 06/11/18 (from the past 48 hour(s))  Protime-INR     Status: Abnormal   Collection Time: 06/11/18  9:18 AM  Result Value Ref Range   Prothrombin Time 23.2 (H) 11.4 - 15.2 seconds   INR 2.1 (H) 0.8 - 1.2    Comment: (NOTE) INR goal varies based on device and disease states. Performed at [facility], [address] [place], [place] [address]   APTT     Status: Abnormal   Collection Time: 06/11/18  9:18 AM  Result Value Ref Range   aPTT 63 (H) 24 - 36 seconds    Comment:        IF BASELINE aPTT IS ELEVATED, SUGGEST PATIENT RISK ASSESSMENT BE USED TO DETERMINE APPROPRIATE ANTICOAGULANT THERAPY. Performed at [facility], [address] [place], [place] [address]   CBC     Status: Abnormal   Collection Time: 06/11/18  9:18 AM  Result Value Ref Range   WBC 11.0 (H) 4.0 - 10.5 K/uL   RBC 5.31 (H) 3.87 - 5.11 MIL/uL   Hemoglobin 13.7 12.0 - 15.0 g/dL   HCT 28.4 13.2 - 44.0 %   MCV 83.4 80.0 - 100.0 fL   MCH 25.8 (L) 26.0 - 34.0 pg   MCHC 30.9 30.0 - 36.0 g/dL   RDW 10.2 (H) 72.5 - 36.6 %   Platelets 149 (L) 150 - 400 K/uL    Comment: REPEATED TO VERIFY   nRBC 0.0 0.0 - 0.2 %    Comment: Performed at [facility], [address] [place], [place] [address]  Differential     Status: Abnormal   Collection Time: 06/11/18  9:18 AM  Result Value Ref Range   Neutrophils Relative % 70 %   Neutro Abs 7.8 (H) 1.7 - 7.7 K/uL   Lymphocytes Relative 19 %   Lymphs Abs 2.1 0.7 - 4.0 K/uL   Monocytes Relative 7 %   Monocytes Absolute 0.8 0.1 -  1.0 K/uL   Eosinophils Relative 3 %   Eosinophils Absolute 0.3 0.0 - 0.5 K/uL   Basophils Relative 0 %   Basophils Absolute 0.0 0.0 - 0.1 K/uL  Immature Granulocytes 1 %   Abs Immature Granulocytes 0.05 0.00 - 0.07 K/uL    Comment: Performed at [facility], [address] [place], [place] [address]  Comprehensive metabolic panel     Status: Abnormal   Collection Time: 06/11/18  9:18 AM  Result Value Ref Range   Sodium 141 135 - 145 mmol/L   Potassium 4.2 3.5 - 5.1 mmol/L   Chloride 101 98 - 111 mmol/L   CO2 28 22 - 32 mmol/L   Glucose, Bld 77 70 - 99 mg/dL   BUN 46 (H) 8 - 23 mg/dL   Creatinine, Ser 6.041.15 (H) 0.44 - 1.00 mg/dL   Calcium 9.7 8.9 - 54.010.3 mg/dL   Total Protein 7.1 6.5 - 8.1 g/dL   Albumin 3.3 (L) 3.5 - 5.0 g/dL   AST 31 15 - 41 U/L   ALT 22 0 - 44 U/L   Alkaline Phosphatase 128 (H) 38 - 126 U/L   Total Bilirubin 0.9 0.3 - 1.2 mg/dL   GFR calc non Af Amer 44 (L) >60 mL/min   GFR calc Af Amer 51 (L) >60 mL/min   Anion gap 12 5 - 15    Comment: Performed at [facility], [address] [place], [place] [address]  Urinalysis, Routine w reflex microscopic     Status: None   Collection Time: 06/11/18  9:18 AM  Result Value Ref Range   Color, Urine YELLOW YELLOW   APPearance CLEAR CLEAR   Specific Gravity, Urine 1.010 1.005 - 1.030   pH 7.0 5.0 - 8.0   Glucose, UA NEGATIVE NEGATIVE mg/dL   Hgb urine dipstick NEGATIVE NEGATIVE   Bilirubin Urine NEGATIVE NEGATIVE   Ketones, ur NEGATIVE NEGATIVE mg/dL   Protein, ur NEGATIVE NEGATIVE mg/dL   Nitrite NEGATIVE NEGATIVE   Leukocytes,Ua NEGATIVE NEGATIVE    Comment: Microscopic not done on urines with negative protein, blood, leukocytes, nitrite, or glucose < 500 mg/dL. Performed at [facility], [address] [place], [place] [address]   Lactic acid, plasma     Status: None   Collection Time: 06/11/18  9:18 AM  Result Value Ref Range   Lactic Acid, Venous 0.9 0.5 - 1.9 mmol/L    Comment:  Performed at [facility], [address] [place], [place] [address]  SARS Coronavirus 2 (CEPHEID - Performed in [facility] hospital lab), Hosp Order     Status: None   Collection Time: 06/11/18  9:18 AM  Result Value Ref Range   SARS Coronavirus 2 NEGATIVE NEGATIVE    Comment: (NOTE) If result is NEGATIVE SARS-CoV-2 target nucleic acids are NOT DETECTED. The SARS-CoV-2 RNA is generally detectable in upper and lower  respiratory specimens during the acute phase of infection. The lowest  concentration of SARS-CoV-2 viral copies this assay can detect is 250  copies / mL. A negative result does not preclude SARS-CoV-2 infection  and should not be used as the sole basis for treatment or other  patient management decisions.  A negative result may occur with  improper specimen collection / handling, submission of specimen other  than nasopharyngeal swab, presence of viral mutation(s) within the  areas targeted by this assay, and inadequate number of viral copies  (<250 copies / mL). A negative result must be combined with clinical  observations, patient history, and epidemiological information. If result is POSITIVE SARS-CoV-2 target nucleic acids are DETECTED. The SARS-CoV-2 RNA is generally detectable in upper and lower  respiratory specimens dur ing the acute phase of infection.  Positive  results are indicative of active infection with SARS-CoV-2.  Clinical  correlation with patient history and other diagnostic information is  necessary to determine patient infection status.  Positive results do  not rule out bacterial infection or co-infection with other viruses. If result is PRESUMPTIVE POSTIVE SARS-CoV-2 nucleic acids MAY BE PRESENT.   A presumptive positive result was obtained on the submitted specimen  and confirmed on repeat testing.  While 2019 novel coronavirus  (SARS-CoV-2) nucleic acids may be present in the submitted sample  additional confirmatory testing may be  necessary for epidemiological  and / or clinical management purposes  to differentiate between  SARS-CoV-2 and other Sarbecovirus currently known to infect humans.  If clinically indicated additional testing with an alternate test  methodology [phone]) is advised. The SARS-CoV-2 RNA is generally  detectable in upper and lower respiratory sp ecimens during the acute  phase of infection. The expected result is Negative. Fact Sheet for Patients:  BoilerBrush.com.cy Fact Sheet for Healthcare Providers: https://pope.com/ This test is not yet approved or cleared by the [place] FDA and has been authorized for detection and/or diagnosis of SARS-CoV-2 by FDA under an Emergency Use Authorization (EUA).  This EUA will remain in effect (meaning this test can be used) for the duration of the COVID-19 declaration under Section 564(b)(1) of the Act, 21 U.S.C. section 360bbb-3(b)(1), unless the authorization is terminated or revoked sooner. Performed at [facility], [address] [place], [place] [address]   I-stat Creatinine, ED     Status: Abnormal   Collection Time: 06/11/18  9:29 AM  Result Value Ref Range   Creatinine, Ser 1.20 (H) 0.44 - 1.00 mg/dL  Lactic acid, plasma     Status: None   Collection Time: 06/11/18 11:13 AM  Result Value Ref Range   Lactic Acid, Venous 1.0 0.5 - 1.9 mmol/L    Comment: Performed at [facility], [address], [place], [place] [address]   Ct Head Wo Contrast  Result Date: 06/11/2018 CLINICAL DATA:  Left-sided weakness. Left-sided facial droop. Worsened confusion. EXAM: CT HEAD WITHOUT CONTRAST TECHNIQUE: Contiguous axial images were obtained from the base of the skull through the vertex without intravenous contrast. COMPARISON:  10/05/2017 FINDINGS: Brain: The brainstem and cerebellum are unremarkable. Left cerebral hemisphere shows minimal small vessel change of the white matter. On the right,  there has been old infarction in the right basal ganglia and insula which has progressed to atrophy, encephalomalacia and gliosis. There is old appearing infarction in the right parietal lobe and temporoparietal junction, which was not visibly affected in August of last year. This does not appear acute however. There is no identifiable acute infarction. No mass lesion, hemorrhage, hydrocephalus or extra-axial collection. Vascular: There is atherosclerotic calcification of the major vessels at the base of the brain. Skull: Negative Sinuses/Orbits: Some fluid layering in the right division of the sphenoid sinus. Other sinuses are clear. Orbits are negative. 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 The area of involvement is more extensive than was seen in August of last year, with more involvement of the temporoparietal junction and posterior parietal lobe, but the findings do not look acute today. No hemorrhage or mass effect. Electronically Signed   By: Paulina Fusi M.D.   On: 06/11/2018 10:17   Ct Femur Left W Contrast  Result Date: 06/11/2018 CLINICAL DATA:  Left thigh swelling and pain. Concern for soft tissue infection. EXAM: CT OF THE LOWER RIGHT EXTREMITY WITH CONTRAST TECHNIQUE: Multidetector CT imaging of the lower right extremity was performed according to the standard protocol following intravenous contrast administration. COMPARISON:  Left femur x-rays from same day. CONTRAST:  OMNIPAQUE IOHEXOL 300 MG/ML  SOLN FINDINGS: Bones/Joint/Cartilage No acute fracture or dislocation. No osseous destruction or periosteal reaction. Sclerosis within the left femoral head. Moderate left hip joint space narrowing. Prior left total knee arthroplasty. No evidence of hardware failure or loosening. No hip or knee joint effusion. Ligaments Suboptimally assessed by CT. Muscles and Tendons Grossly intact. Severe left gluteus medius muscle atrophy.  Unchanged small lipoma in the proximal lateral gastrocnemius muscle. Soft tissues In the superficial medial aspect of the left mid thigh, there is a 3.5 x 2.0 by 1.8 cm complex, predominantly low-density lesion with thick rim enhancement. There is prominent overlying skin thickening of the medial thigh and mild surrounding fat stranding. No subcutaneous emphysema. Large stool ball in the rectum.  No lymphadenopathy. IMPRESSION: 1. 3.5 cm complex, predominantly low-density lesion with thick rim enhancement in the superficial medial aspect of the left mid thigh. Appearance is most suggestive of an abscess given overlying skin thickening and surrounding soft tissue inflammatory changes suggestive of cellulitis, however a necrotic mass could have a similar appearance. 2. No subcutaneous emphysema or deeper soft tissue infection. 3. 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 Electronically Signed   By: Obie Dredge M.D.   On: 06/11/2018 11:55   Dg Femur Portable Min 2 Views Left  Result Date: 06/11/2018 CLINICAL DATA:  Left medial thigh infection. EXAM: LEFT FEMUR PORTABLE 2 VIEWS COMPARISON:  None. FINDINGS: One-view does not show any bone abnormality or radiopaque foreign object. IMPRESSION: No specific or significant finding on this one view examination. Electronically Signed   By: Paulina Fusi M.D.   On: 06/11/2018 09:58    Pending Labs Unresulted Labs (From admission, onward)    Start     Ordered   06/12/18 0500  Basic metabolic panel  Daily,   R     06/11/18 1233   06/12/18 0500  APTT  Tomorrow morning,   R     06/11/18 1335   06/12/18 0500  Protime-INR  Tomorrow morning,   R     06/11/18 1335   06/12/18 0500  CBC  Tomorrow morning,   R     06/11/18 1335   06/11/18 0912  Culture, blood (routine x 2)  BLOOD CULTURE X 2,   STAT     06/11/18 0912          Vitals/Pain Today's Vitals   06/11/18 1230 06/11/18 1245 06/11/18 1300 06/11/18  1400  BP: (!) 110/53 (!) 108/46 125/63 133/67  Pulse: (!) 51 (!) 46 (!) 42   Resp: 17 14 17 20   SpO2: 96% 95% 96%   Weight:      Height:      PainSc:        Isolation Precautions No active isolations  Medications Medications  vancomycin (VANCOCIN) 1,750 mg in sodium chloride 0.9 % 500 mL IVPB (1,750 mg Intravenous New Bag/Given 06/11/18 1312)  vancomycin (VANCOCIN) 1,500 mg in sodium chloride 0.9 % 500 mL IVPB (has no administration in time range)  atorvastatin (LIPITOR) tablet 40 mg (has no administration in time range)  pantoprazole (PROTONIX) EC tablet 40 mg (has no administration in time range)  calcium carbonate (  TUMS - dosed in mg elemental calcium) chewable tablet 200 mg of elemental calcium (has no administration in time range)  polyethylene glycol (MIRALAX / GLYCOLAX) packet 17 g (has no administration in time range)  pregabalin (LYRICA) capsule 25 mg (has no administration in time range)  chlorhexidine (PERIDEX) 0.12 % solution 15 mL (has no administration in time range)  phenol (CHLORASEPTIC) mouth spray 2 spray (has no administration in time range)  sodium chloride flush (NS) 0.9 % injection 3 mL (has no administration in time range)  0.9 %  sodium chloride infusion (has no administration in time range)  acetaminophen (TYLENOL) tablet 650 mg (has no administration in time range)    Or  acetaminophen (TYLENOL) suppository 650 mg (has no administration in time range)  oxyCODONE (Oxy IR/ROXICODONE) immediate release tablet 5 mg (has no administration in time range)  ondansetron (ZOFRAN) tablet 4 mg (has no administration in time range)    Or  ondansetron (ZOFRAN) injection 4 mg (has no administration in time range)  albuterol (PROVENTIL) (2.5 MG/3ML) 0.083% nebulizer solution 2.5 mg (has no administration in time range)  ceFAZolin (ANCEF) IVPB 1 g/50 mL premix (has no administration in time range)  sorbitol, milk of mag, mineral oil, glycerin (SMOG) enema (has no  administration in time range)  sodium chloride 0.9 % bolus 500 mL (0 mLs Intravenous Stopped 06/11/18 1210)  iohexol (OMNIPAQUE) 300 MG/ML solution 100 mL (100 mLs Intravenous Contrast Given 06/11/18 1112)  ceFAZolin (ANCEF) IVPB 1 g/50 mL premix (0 g Intravenous Stopped 06/11/18 1300)    Mobility non-ambulatory Low fall risk   Focused Assessments Cardiac Assessment Handoff:  Cardiac Rhythm: Atrial fibrillation No results found for: CKTOTAL, CKMB, CKMBINDEX, TROPONINI No results found for: DDIMER Does the Patient currently have chest pain? No      R Recommendations: See Admitting Provider Note  Report given to: 3E  Additional Notes:

## 2018-06-11 NOTE — ED Notes (Signed)
EDP at bedside doing [place] of inner/Lt thigh due to swelling and redness, pt states its painful to touch.

## 2018-06-11 NOTE — ED Notes (Signed)
Patient transported to CT 

## 2018-06-11 NOTE — ED Notes (Signed)
Nurse Navigator notified pt niece of her room number and phone number to reach the department.

## 2018-06-11 NOTE — ED Notes (Signed)
X-ray at bedside

## 2018-06-11 NOTE — Progress Notes (Signed)
Pharmacy Antibiotic Note  Margaret RobesDorothy Collier is a 85 y.o. female admitted on 06/11/2018 with concerns for cellulitis.  Pharmacy has been consulted for vancomycin dosing. WBC 11, LA 0.9. Scr 1.2 (above baseline 0.8). She was started on cephalexin 500 mg q8h on 5/4 (planned course 7 days).   Vancomycin 1500 mg IV Q 48 hrs. Goal AUC 400-550. Expected AUC: 492 SCr used: 1.2  Plan: Vancomycin 1750 mg IV x1 then 1500 mg IV q48h. Monitor clinical status, cultures, renal function, and LOT Monitor vancomycin levels as needed  Height: 5' (152.4 cm) Weight: 185 lb (83.9 kg) IBW/kg (Calculated) : 45.5  No data recorded.  Recent Labs  Lab 06/11/18 0918 06/11/18 0929  WBC 11.0*  --   CREATININE 1.15* 1.20*  LATICACIDVEN 0.9  --     Estimated Creatinine Clearance: 33.6 mL/min (A) (by C-G formula based on SCr of 1.2 mg/dL (H)).    Allergies  Allergen Reactions  . Zanaflex [Tizanidine Hcl] Shortness Of Breath, Other (See Comments) and Cough    Dehydration - reported by [facility] 10/07/13  . Alprazolam Other (See Comments)    Dry mouth - reported by [facility] 05/13/14   . Carisoprodol Other (See Comments)    Dizziness - reported by [facility] 05/13/14   . Chlorthalidone Other (See Comments)    syncope  . Colesevelam Other (See Comments)    GI upset and stool change - reported by [facility] 05/13/14   . Dilaudid [Hydromorphone Hcl] Hives and Swelling  . Duloxetine Other (See Comments)    Dizziness - reported by [facility] 05/13/14   . Felodipine Other (See Comments)    Dizziness, groin pain - reported by [facility] 05/13/[facility]   . Fosamax [Alendronate Sodium] Nausea And Vomiting and Other (See Comments)    Back and hip pain  . Furosemide Other (See Comments)    Dry mouth/dizziness - reported by [facility] 05/13/14   . Gabapentin Other (See Comments)    Insomnia - reported by [facility] 05/13/14   . Ibuprofen Other (See Comments)    GI pain - reported [facility] 05/13/14   . Lovastatin Other (See Comments)    Weakness - reported by [facility] 05/13/14   . Meclizine Other (See Comments)    Sleeplessness - reported by [facility] 05/13/14   . Meperidine Other (See Comments)    Jitteriness - reported by [facility] 05/13/14   . Methadone Other (See Comments)    Stool impaction - reported [facility]   . Morphine Other (See Comments)    Dizziness - reported by [facility] 05/13/14   . Niacin Other (See Comments)    Flushing - reported by [facility] 05/13/14   . Omeprazole Other (See Comments)    Sleeplessness - reported by [facility] 05/13/14   . Oxycodone Other (See Comments)    Groin pain - reported [facility] 05/13/14   . Pravastatin Other (See Comments)    Weakness - reported by [facility] 05/13/14   . Simvastatin Other (See Comments)    Weakness - reported by [facility] 05/13/14   . Statins Other (See Comments)    Myalgia, weakness - reported [facility] 05/13/14  . Tramadol Other (See Comments)    Nervous - reported [facility] 05/13/14   . Valsartan Other (See Comments)  Dizziness - reported by [facility] 05/13/14   . Hydrocodone-Acetaminophen Nausea And Vomiting    reported by [facility] 05/13/14     Antimicrobials this admission: Cefazolin 5/6 x1 Vancomycin 5/6 >> Cephalexin 5/4 >> 5/6   Dose adjustments this admission:   Microbiology results: 5/6 BCx: 5/6 COVID: negative   Thank you for allowing pharmacy to be a part of this patient's care.  Marcelino Freestone, PharmD PGY2 Cardiology Pharmacy Resident Please check AMION for all Pharmacist numbers by unit 06/11/2018 12:34 PM

## 2018-06-11 NOTE — ED Triage Notes (Signed)
Pt resides in [facility] facility & was found by staff to have stroke-like symptoms, more than her usual baseline. She has a Lt sided deficit that is residual from past strokes, as well as a Lt sided facial droop. She was said to be more confused than usual as well.

## 2018-06-11 NOTE — Progress Notes (Signed)
Spoke with [name] on the phone with patients permission regarding plan of care and patient's concern for her glasses. I let her know that I spoke with the nurse in the ER where patient was before coming to unit and  Nurse Iseley did not see patient with glasses on arrival, glasses are not in patient's room. Margaret Collier is to call patients facility to check if her glasses are in there.

## 2018-06-11 NOTE — ED Notes (Addendum)
Attempted to give report to [place], RN on 3E.

## 2018-06-11 NOTE — ED Notes (Signed)
Call to [facility] to advise them that pt is being admitted to the hospital. Spoke with Talbert Forest.

## 2018-06-11 NOTE — Progress Notes (Signed)
Called received from CCMD upon seeing patients heart rate fluctuated from 45s to 50s. Patient is currently lying down in bed with eyes close, will respond upon calling her name. Patient is alert and oriented to self, place and disoriented to time.

## 2018-06-11 NOTE — Progress Notes (Signed)
Patient had a bowel movement upon unit arrival, patient was cleaned and purewick was placed.  Patient stated that she had her glasses with her when she came to the hospital, upon patient arrival to unit did not had glasses and called nurse Elliot Gurney from ED and she stated that she did not had glasses upon arrival to their unit.

## 2018-06-11 NOTE — H&P (Signed)
History and Physical    Margaret RobesDorothy Collier VHQ:[id number] DOB: [birth year] DOA: 06/11/2018  Referring MD/NP/PA: Frederick Peersachel Little, MD PCP: System, Pcp Not In  Patient coming from: [facility] facility via EMS  Chief Complaint: Confusion  I have personally briefly reviewed patient[facility]   HPI: Margaret RobesDorothy Collier is a 85 y.o. female with medical history significant of HTN, HLD, chronic A. 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  History is limited from the patient due to history of dementia and reports of increased confusion.  Patient reports that she is unaware why she is here.  However, notes that she [facility] prior to coming into the hospital for an infection in her leg.  Complains of pain in her back and increased sensitive on the left sided that is not new.  Discussed case with the patient's niece over the phone who notes that they had started her on antibiotics for an infection in her legs and reports of a cut to her right leg 2 days ago.  At baseline she is reportedly bedbound and the facility uses a Hoyer lift to get her up to chair daily.  Patient denies any other complaints at this time.  ED Course: On admission patient was noted to be 97.1 F noted per nurse, heart rate 37-56, and all other vital signs relatively maintained.  Labs revealed WBC 11, platelets 149, BUN 46, creatinine 1.15, glucose 77, lactic acid 0.9, and INR 2.1.  CT scan of the brain showed areas of previous stroke, but nothing acute.  COVID testing negative.  X-rays of left leg were normal, but CT scan revealed 3.5 cm density concerning for abscess.  Patient was started on empiric antibiotics of vancomycin and Ancef.  TRH called to admit.  Review of Systems  Unable to perform ROS: Medical condition  Cardiovascular: Positive for  leg swelling.  Musculoskeletal: Positive for back pain.  Neurological: Positive for sensory change.    Past Medical History:  Diagnosis Date  . Atrial fibrillation (HCC)   . Hyperlipidemia   . Hypertension   . OSA (obstructive sleep apnea)     History reviewed. No pertinent surgical history.   reports that she has never smoked. She has never used smokeless tobacco. No history on file for alcohol and drug.  Allergies  Allergen Reactions  . Zanaflex [Tizanidine Hcl] Shortness Of Breath, Other (See Comments) and Cough    Dehydration - [facility] 10/07/13  . Alprazolam Other (See Comments)    Dry mouth - reported by [facility] 05/13/14   . Carisoprodol Other (See Comments)    Dizziness - reported by [facility] 05/13/14   . Chlorthalidone Other (See Comments)    syncope  . Colesevelam Other (See Comments)    GI upset and stool change - reported by [facility] 05/13/14   . Dilaudid [Hydromorphone Hcl] Hives and Swelling  . Duloxetine Other (See Comments)    Dizziness - reported by [facility] 05/13/14   . Felodipine Other (See Comments)    Dizziness, groin pain - reported by [facility] 05/13/14   . Fosamax [Alendronate Sodium] Nausea And Vomiting and Other (See Comments)    Back and hip pain  . Furosemide Other (See Comments)    Dry mouth/dizziness - reported [facility] 05/13/14   . Gabapentin Other (See Comments)    Insomnia - reported by [facility] 05/13/14   . Ibuprofen Other (See Comments)    GI pain - reported by [facility] 05/13/14   . Lovastatin Other (See Comments)    Weakness - reported by [facility] 05/13/14   . Meclizine Other (See Comments)    Sleeplessness - reported by [facility] 05/13/14   . Meperidine Other (See Comments)    Jitteriness - reported by [facility] 05/13/14   . Methadone Other (See Comments)    Stool impaction - reported by [facility] 05/13/14   . Morphine Other (See Comments)     Dizziness - reported by [facility] 05/13/14   . Niacin Other (See Comments)    Flushing - reported by [facility] 05/13/14   . Omeprazole Other (See Comments)    Sleeplessness - reported by [facility] 05/13/14   . Oxycodone Other (See Comments)    Groin pain - reported by [facility] 05/13/14   . Pravastatin Other (See Comments)    Weakness - reported by [facility] 05/13/14   . Simvastatin Other (See Comments)    Weakness - reported by [facility] 05/13/14   . Statins Other (See Comments)    Myalgia, weakness - reported by [facility] 05/13/14  . Tramadol Other (See Comments)    Nervous - reported by [facility] 05/13/14   . Valsartan Other (See Comments)    Dizziness - reported by [facility] 05/13/14   . Hydrocodone-Acetaminophen Nausea And Vomiting    reported by [facility] 05/13/14     Family History  Problem Relation Age of Onset  . Heart disease Mother     Prior to Admission medications   Medication Sig Start Date End Date Taking? Authorizing Provider  albuterol (VENTOLIN HFA) 108 (90 Base) MCG/ACT inhaler Inhale 2 puffs into the lungs every 4 (four) hours as needed for wheezing or shortness of breath.   Yes [provider]  apixaban (ELIQUIS) 5 MG TABS tablet Take 1 tablet (5 mg total) by mouth 2 (two) times daily. 01/28/18 06/11/18 Yes Jerald Kief, MD  atorvastatin (LIPITOR) 40 MG tablet Take 40 mg by mouth daily at 6 PM.    Yes [provider]  b complex vitamins capsule Take 1 capsule by mouth daily.   Yes [provider]  calcium carbonate (TUMS - DOSED IN MG ELEMENTAL CALCIUM) 500 MG chewable tablet Chew 1 tablet by mouth 3 (three) times daily before meals.   Yes [provider]  cephALEXin (KEFLEX) 500 MG capsule Take 500 mg by mouth every 8 (eight) hours.   Yes [provider]  chlorhexidine (PERIDEX) 0.12 % solution Use as directed 15 mLs in the mouth or throat 2 (two) times daily.   Yes  [provider]  cholecalciferol (VITAMIN D3) 25 MCG (1000 UT) tablet Take 1,000 Units by mouth daily.   Yes [provider]  furosemide (LASIX) 40 MG tablet Take 40 mg by mouth daily.    Yes [provider]  furosemide (LASIX) 80 MG tablet Take 1 tablet (80 mg total) by mouth daily. Patient taking differently: Take 20 mg by mouth as needed for fluid or edema. May have every day PRN for wt gain of 3 lbs in one day or 5 lbs in a week 01/28/18 06/11/18 Yes Jerald Kief,  MD  oxyCODONE-acetaminophen (PERCOCET) 10-325 MG tablet Take 1 tablet by mouth every 12 (twelve) hours. 01/28/18  Yes Jerald Kief, MD  OXYGEN Inhale 2 L/min into the lungs See admin instructions. Apply via nasal canula daily at bedtime and remove every morning   Yes [provider]  pantoprazole (PROTONIX) 40 MG tablet Take 40 mg by mouth daily at 6 (six) AM.    Yes [provider]  phenol (CHLORASEPTIC) 1.4 % LIQD Use as directed 2 sprays in the mouth or throat every 2 (two) hours as needed for throat irritation / pain. x48   Yes [provider]  polyethylene glycol (MIRALAX / GLYCOLAX) packet Take 17 g by mouth daily as needed for moderate constipation. 01/28/18  Yes Jerald Kief, MD  potassium chloride SA (K-DUR,KLOR-CON) 20 MEQ tablet Take 20 mEq by mouth daily.   Yes [provider]  pregabalin (LYRICA) 25 MG capsule Take 25 mg by mouth every 8 (eight) hours.    Yes [provider]  spironolactone (ALDACTONE) 25 MG tablet Take 0.5 tablets (12.5 mg total) by mouth daily. 01/29/18 06/11/18 Yes Jerald Kief, MD    Physical Exam:  Constitutional: Elderly female Vitals:   06/11/18 1230 06/11/18 1245 06/11/18 1300 06/11/18 1400  BP: (!) 110/53 (!) 108/46 125/63 133/67  Pulse: (!) 51 (!) 46 (!) 42   Resp: SpO2: 96% 95% 96%   Weight:      Height:       Eyes: PERRL, lids and conjunctivae normal ENMT: Mucous membranes are moist.  Posterior pharynx clear of any exudate or lesions.Normal dentition.  Neck: normal, supple, no masses, no thyromegaly Respiratory: clear to auscultation bilaterally, no wheezing, no crackles. Normal respiratory effort. No accessory muscle use.  Cardiovascular: Irregular and bradycardic./ rubs / gallops.  +1 lower extremity edema. 2+ pedal pulses. No carotid bruits.  Abdomen: no tenderness, no masses palpated. No hepatosplenomegaly. Bowel sounds positive.  Musculoskeletal: no clubbing / cyanosis. No joint deformity upper and lower extremities. Good ROM, no contractures. Normal muscle tone.  Skin: Skin laceration approximately 3 cm to the right lower extremity currently bandaged. Neurologic: CN 2-12 grossly intact. Sensation intact, DTR normal. Strength 5/5 on right and Psychiatric: Alert and oriented x person and place. Normal mood.     Labs on Admission: I have personally reviewed following labs and imaging studies  CBC: Recent Labs  Lab 06/11/18 0918  WBC 11.0*  NEUTROABS 7.8*  HGB 13.7  HCT 44.3  MCV 83.4  PLT 149*   Basic Metabolic Panel: Recent Labs  Lab 06/11/18 0918 06/11/18 0929  NA 141  --   K 4.2  --   CL 101  --   CO2 28  --   GLUCOSE 77  --   BUN 46*  --   CREATININE 1.15* 1.20*  CALCIUM 9.7  --    GFR: Estimated Creatinine Clearance: 33.6 mL/min (A) (by C-G formula based on SCr of 1.2 mg/dL (H)). Liver Function Tests: Recent Labs  Lab 06/11/18 0918  AST 31  ALT 22  ALKPHOS 128*  BILITOT 0.9  PROT 7.1  ALBUMIN 3.3*   No results for input(s): LIPASE, AMYLASE in the last 168 hours. No results for input(s): AMMONIA in the last 168 hours. Coagulation Profile: Recent Labs  Lab 06/11/18 0918  INR 2.1*   Cardiac Enzymes: No results for input(s): CKTOTAL, CKMB, CKMBINDEX, TROPONINI in the last 168 hours. BNP (last 3 results) No results for input(s):  PROBNP in the last 8760 hours. HbA1C: No results for input(s): HGBA1C in the last 72 hours. CBG: No  results for input(s): GLUCAP in the last 168 hours. Lipid Profile: No results for input(s): CHOL, HDL, LDLCALC, TRIG, CHOLHDL, LDLDIRECT in the last 72 hours. Thyroid Function Tests: No results for input(s): TSH, T4TOTAL, FREET4, T3FREE, THYROIDAB in the last 72 hours. Anemia Panel: No results for input(s): VITAMINB12, FOLATE, FERRITIN, TIBC, IRON, RETICCTPCT in the last 72 hours. Urine analysis:    Component Value Date/Time   COLORURINE YELLOW 06/11/2018 0918   APPEARANCEUR CLEAR 06/11/2018 0918   LABSPEC 1.010 06/11/2018 0918   PHURINE 7.0 06/11/2018 0918   GLUCOSEU NEGATIVE 06/11/2018 0918   HGBUR NEGATIVE 06/11/2018 0918   BILIRUBINUR NEGATIVE 06/11/2018 0918   KETONESUR NEGATIVE 06/11/2018 0918   PROTEINUR NEGATIVE 06/11/2018 0918   NITRITE NEGATIVE 06/11/2018 0918   LEUKOCYTESUR NEGATIVE 06/11/2018 0918   Sepsis Labs: Recent Results (from the past 240 hour(s))  SARS Coronavirus 2 (CEPHEID - Performed in [facility] hospital lab), Hosp Order     Status: None   Collection Time: 06/11/18  9:18 AM  Result Value Ref Range Status   SARS Coronavirus 2 NEGATIVE NEGATIVE Final    Comment: (NOTE) If result is NEGATIVE SARS-CoV-2 target nucleic acids are NOT DETECTED. The SARS-CoV-2 RNA is generally detectable in upper and lower  respiratory specimens during the acute phase of infection. The lowest  concentration of SARS-CoV-2 viral copies this assay can detect is 250  copies / mL. A negative result does not preclude SARS-CoV-2 infection  and should not be used as the sole basis for treatment or other  patient management decisions.  A negative result may occur with  improper specimen collection / handling, submission of specimen other  than nasopharyngeal swab, presence of viral mutation(s) within the  areas targeted by this assay, and inadequate number of viral copies  (<250 copies / mL). A negative result must be combined with clinical  observations, patient history, and  epidemiological information. If result is POSITIVE SARS-CoV-2 target nucleic acids are DETECTED. The SARS-CoV-2 RNA is generally detectable in upper and lower  respiratory specimens dur ing the acute phase of infection.  Positive  results are indicative of active infection with SARS-CoV-2.  Clinical  correlation with patient history and other diagnostic information is  necessary to determine patient infection status.  Positive results do  not rule out bacterial infection or co-infection with other viruses. If result is PRESUMPTIVE POSTIVE SARS-CoV-2 nucleic acids MAY BE PRESENT.   A presumptive positive result was obtained on the submitted specimen  and confirmed on repeat testing.  While 2019 novel coronavirus  (SARS-CoV-2) nucleic acids may be present in the submitted sample  additional confirmatory testing may be necessary for epidemiological  and / or clinical management purposes  to differentiate between  SARS-CoV-2 and other Sarbecovirus currently known to infect humans.  If clinically indicated additional testing with an alternate test  methodology [phone]) is advised. The SARS-CoV-2 RNA is generally  detectable in upper and lower respiratory sp ecimens during the acute  phase of infection. The expected result is Negative. Fact Sheet for Patients:  BoilerBrush.com.cy Fact Sheet for Healthcare Providers: https://pope.com/ This test is not yet approved or cleared by the [place] FDA and has been authorized for detection and/or diagnosis of SARS-CoV-2 by FDA under an Emergency Use Authorization (EUA).  This EUA will remain in effect (meaning this test can be used) for the duration of the COVID-19 declaration  under Section 564(b)(1) of the Act, 21 U.S.C. section 360bbb-3(b)(1), unless the authorization is terminated or revoked sooner. Performed at [facility], [address], [place], [place] [address]       Radiological Exams on Admission: Ct Head Wo Contrast  Result Date: 06/11/2018 CLINICAL DATA:  Left-sided weakness. Left-sided facial droop. Worsened confusion. EXAM: CT HEAD WITHOUT CONTRAST TECHNIQUE: Contiguous axial images were obtained from the base of the skull through the vertex without intravenous contrast. COMPARISON:  10/05/2017 FINDINGS: Brain: The brainstem and cerebellum are unremarkable. Left cerebral hemisphere shows minimal small vessel change of the white matter. On the right, there has been old infarction in the right basal ganglia and insula which has progressed to atrophy, encephalomalacia and gliosis. There is old appearing infarction in the right parietal lobe and temporoparietal junction, which was not visibly affected in August of last year. This does not appear acute however. There is no identifiable acute infarction. No mass lesion, hemorrhage, hydrocephalus or extra-axial collection. Vascular: There is atherosclerotic calcification of the major vessels at the base of the brain. Skull: Negative Sinuses/Orbits: Some fluid layering in the right division of the sphenoid sinus. Other sinuses are clear. Orbits are negative. 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 The area of involvement is more extensive than was seen in August of last year, with more involvement of the temporoparietal junction and posterior parietal lobe, but the findings do not look acute today. No hemorrhage or mass effect. Electronically Signed   By: Paulina Fusi M.D.   On: 06/11/2018 10:17   Ct Femur Left W Contrast  Result Date: 06/11/2018 CLINICAL DATA:  Left thigh swelling and pain. Concern for soft tissue infection. EXAM: CT OF THE LOWER RIGHT EXTREMITY WITH CONTRAST TECHNIQUE: Multidetector CT imaging of the lower right extremity was performed according to the standard protocol following intravenous contrast administration. COMPARISON:  Left femur  x-rays from same day. CONTRAST:  OMNIPAQUE IOHEXOL 300 MG/ML  SOLN FINDINGS: Bones/Joint/Cartilage No acute fracture or dislocation. No osseous destruction or periosteal reaction. Sclerosis within the left femoral head. Moderate left hip joint space narrowing. Prior left total knee arthroplasty. No evidence of hardware failure or loosening. No hip or knee joint effusion. Ligaments Suboptimally assessed by CT. Muscles and Tendons Grossly intact. Severe left gluteus medius muscle atrophy. Unchanged small lipoma in the proximal lateral gastrocnemius muscle. Soft tissues In the superficial medial aspect of the left mid thigh, there is a 3.5 x 2.0 by 1.8 cm complex, predominantly low-density lesion with thick rim enhancement. There is prominent overlying skin thickening of the medial thigh and mild surrounding fat stranding. No subcutaneous emphysema. Large stool ball in the rectum.  No lymphadenopathy. IMPRESSION: 1. 3.5 cm complex, predominantly low-density lesion with thick rim enhancement in the superficial medial aspect of the left mid thigh. Appearance is most suggestive of an abscess given overlying skin thickening and surrounding soft tissue inflammatory changes suggestive of cellulitis, however a necrotic mass could have a similar appearance. 2. No subcutaneous emphysema or deeper soft tissue infection. 3. 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 Electronically Signed   By: Obie Dredge M.D.   On: 06/11/2018 11:55   Dg Femur Portable Min 2 Views Left  Result Date: 06/11/2018 CLINICAL DATA:  Left medial thigh infection. EXAM: LEFT FEMUR PORTABLE 2 VIEWS COMPARISON:  None. FINDINGS: One-view does not show any bone abnormality or radiopaque foreign  object. IMPRESSION: No specific or significant finding on this one view examination. Electronically Signed   By: Paulina Fusi M.D.   On: 06/11/2018 09:58    EKG: Independently reviewed. Bradycardic  at 36 bpm with background artifact.  Assessment/Plan Cellulitis and abscess of left leg: Acute.  Patient was reportedly being treated for bilateral lower extremity cellulitis with Keflex which was started on 5/4.  CT scan showing a 3.5 x 2 x 1.8 cm lesion concerning for possible abscess and surrounding inflammation.  Empiric antibiotics of vancomycin and cefepime were started. - Admit to a telemetry bed - Continue empiric antibiotics vancomycin and Keflex - General surgery consulted, will follow-up for further recommendation - Oxycodone 5 mg every 6 hours as needed for pain  Bradycardia: Acute.  Patient with heart rates 37-56 noted on admission.  Not on any rate controlling medications.  Patient appears currently asymptomatic. - Follow-up telemetry overnight - Message sent for cardiology to evaluate  Acute kidney injury: At baseline patient's creatinine previously noted to be around 0.81, but presents with creatinine 1.15 with BUN 46.  Suspect prerenal cause giving elevated BUN to creatinine ratio. - Gentle IV fluids on saline at 50 mL/h - Recheck kidney function in a.m.  Leukocytosis: Acute.  WBC elevated at 11.  Suspect secondary to cellulitis and abscess. -Recheck CBC in a.m.  A. fib on chronic anticoagulation: Chronic.  Patient on Eliquis and INR noted to be 2.1.  Patient not currently on any rate controlling medications - Hold Eliquis due to hospital need of surgery   Thrombocytopenia: Acute.  Platelet count mildly low at 149 on admission.  No reports of bleeding noted. -Recheck CBC in a.m.  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  Constipation: CT scan of the abdomen did note signs of large stool ball in the rectum. -Smog enema x1 -Continue MiraLAX as needed  Chronic congestive heart failure: Patient followed in outpatient setting by [facility] cardiology.  Previously seen by Dr. Beverely Pace of [facility] cardiology in  2018 noted to have normal LV function and echo.  Niece notes that patient does need of a repeat echocardiogram, but it is been delayed due to COVID-19. - Strict intake and output - Daily weights - Holding furosemide and spironolactone due to acute kidney injury  History of CVA with residual weakness, neuropathy: Patient chronically has left-sided weakness from previous strokes.  CT scan of the brain showed no acute abnormalities and old previous stroke.  Reports increased sensitivity to touch on the left side that is not new. - Neuro checks every 4 hours x4 - hold Lyrica  History of dementia: At baseline patient has intermittent issues with remembering things.  OSA: Patient on 2 L of nasal cannula oxygen at night. - 2 L nasal cannula oxygen at night  DVT prophylaxis: SCDs Code Status: Full Family Communication: Discussed plan of care with the patient's niece over the phone. Disposition Plan: likely discharge to snf once stable Consults called: general Surgery  Admission status: observation  Clydie Braun MD [facility] Pager [phone]   If 7PM-7AM, please contact night-coverage [email or web address] Password [facility]  06/11/2018, 12:29 PM

## 2018-06-11 NOTE — ED Provider Notes (Signed)
[facility] EMERGENCY DEPARTMENT Provider Note   CSN: [id number] Arrival date & time: 06/11/18  5284    History   Chief Complaint Chief Complaint  Patient presents with  . Cerebrovascular Accident    HPI Ericia Moxley is a 85 y.o. female.     85 year old female with past medical history including memory problems, atrial fibrillation, hypertension, hyperlipidemia, OSA, chronic oxygen use (2L), CHF, CVA with chronic left-sided weakness who presents with left-sided weakness.  Staff at skilled nursing facility found this morning that the patient had worse left-sided weakness than usual, specifically worse-than-usual facial droop and some slurred speech. Unknown last normal/baseline, possibly sometime yesterday. PT endorses leg pain from skin tear. She also endorses L thigh pain.   LEVEL 5 CAVEAT DUE TO MEMORY PROBLEMS  The history is provided by the patient, the nursing home and the EMS personnel.  Cerebrovascular Accident     Past Medical History:  Diagnosis Date  . Atrial fibrillation (HCC)   . Hyperlipidemia   . Hypertension   . OSA (obstructive sleep apnea)     Patient Active Problem List   Diagnosis Date Noted  . Bradycardia 01/21/2018  . Acute exacerbation of CHF (congestive heart failure) (HCC) 01/17/2018  . Atrial fibrillation (HCC) 01/17/2018  . Hypertension 01/17/2018  . Hyperlipidemia 01/17/2018  . OSA (obstructive sleep apnea) 01/17/2018  . History of CVA (cerebrovascular accident) without residual deficits 01/17/2018    No past surgical history on file.   OB History   No obstetric history on file.      Home Medications    Prior to Admission medications   Medication Sig Start Date End Date Taking? Authorizing Provider  amitriptyline (ELAVIL) 10 MG tablet Take 10 mg by mouth at bedtime.    [provider]  apixaban (ELIQUIS) 5 MG TABS tablet Take 1 tablet (5 mg total) by mouth 2 (two) times daily. 01/28/18 02/27/18  Jerald Kief, MD  atorvastatin (LIPITOR) 40 MG tablet Take 40 mg by mouth at bedtime.     [provider]  b complex vitamins capsule Take 1 capsule by mouth daily.    [provider]  calcium carbonate (TUMS - DOSED IN MG ELEMENTAL CALCIUM) 500 MG chewable tablet Chew 1 tablet by mouth 3 (three) times daily before meals.    [provider]  chlorhexidine (PERIDEX) 0.12 % solution Use as directed 15 mLs in the mouth or throat 2 (two) times daily.    [provider]  cholecalciferol (VITAMIN D3) 25 MCG (1000 UT) tablet Take 1,000 Units by mouth daily.    [provider]  furosemide (LASIX) 40 MG tablet Take 1 tablet (40 mg total) by mouth at bedtime. 01/28/18 02/27/18  Jerald Kief, MD  furosemide (LASIX) 40 MG tablet Take 40 mg by mouth at bedtime.    [provider]  furosemide (LASIX) 80 MG tablet Take 1 tablet (80 mg total) by mouth daily. 01/28/18 02/27/18  Jerald Kief, MD  ipratropium-albuterol (DUONEB) 0.5-2.5 (3) MG/3ML SOLN Take 3 mLs by nebulization 3 (three) times daily. Swish and spit with water after treatment    [provider]  oxyCODONE-acetaminophen (PERCOCET) 10-325 MG tablet Take 1 tablet by mouth every 12 (twelve) hours. 01/28/18   Jerald Kief, MD  OXYGEN Inhale 2 L/min into the lungs See admin instructions. Apply via nasal canula daily at bedtime and remove every morning    [provider]  pantoprazole (PROTONIX) 40 MG tablet Take 40  mg by mouth daily at 6 (six) AM.     [provider]  polyethylene glycol (MIRALAX / GLYCOLAX) packet Take 17 g by mouth daily as needed for moderate constipation. Patient not taking: Reported on 01/31/2018 01/28/18   Jerald Kiefhiu, Stephen K, MD  potassium chloride SA (K-DUR,KLOR-CON) 20 MEQ tablet Take 20 mEq by mouth daily.    [provider]  pregabalin (LYRICA) 25 MG capsule Take 25 mg by mouth 2 (two) times daily.    [provider]  spironolactone  (ALDACTONE) 25 MG tablet Take 0.5 tablets (12.5 mg total) by mouth daily. 01/29/18 02/28/18  Jerald Kiefhiu, Stephen K, MD    Family History Family History  Problem Relation Age of Onset  . Heart disease Mother     Social History Social History   Tobacco Use  . Smoking status: Never Smoker  . Smokeless tobacco: Never Used  Substance Use Topics  . Alcohol use: Not on file  . Drug use: Not on file     Allergies   Zanaflex [tizanidine hcl]; Alprazolam; Carisoprodol; Chlorthalidone; Colesevelam; Dilaudid [hydromorphone hcl]; Duloxetine; Felodipine; Fosamax [alendronate sodium]; Furosemide; Gabapentin; Ibuprofen; Lovastatin; Meclizine; Meperidine; Methadone; Morphine; Niacin; Omeprazole; Oxycodone; Pravastatin; Simvastatin; Statins; Tramadol; Valsartan; and Hydrocodone-acetaminophen   Review of Systems Review of Systems  Unable to perform ROS: Dementia     Physical Exam Updated Vital Signs Pulse (!) 38   Resp 11   Ht 5' (1.524 m)   Wt 83.9 kg   SpO2 100%   BMI 36.13 kg/m   Physical Exam Vitals signs and nursing note reviewed.  Constitutional:      General: She is not in acute distress.    Appearance: She is well-developed.  HENT:     Head: Normocephalic and atraumatic.     Mouth/Throat:     Mouth: Mucous membranes are moist.     Pharynx: Oropharynx is clear.  Eyes:     Conjunctiva/sclera: Conjunctivae normal.     Pupils: Pupils are equal, round, and reactive to light.  Neck:     Musculoskeletal: Neck supple.  Cardiovascular:     Rate and Rhythm: Normal rate and regular rhythm.     Heart sounds: Normal heart sounds. No murmur.  Pulmonary:     Effort: Pulmonary effort is normal.     Breath sounds: Normal breath sounds.  Abdominal:     General: Bowel sounds are normal. There is no distension.     Palpations: Abdomen is soft.     Tenderness: There is no abdominal tenderness.  Musculoskeletal:        General: Swelling and tenderness present.     Comments: Edema,  induration, warmth, and tenderness of L medial thigh extending onto posterior thigh, no crepitus  Skin:    General: Skin is warm and dry.     Comments: Skin tear w/ steristrips in place on R shin  Neurological:     Mental Status: She is alert.     Comments: Oriented to person and place, occasional confusion during conversation, following basic commands, dense L sided facial droop; contracture of L arm; unwilling to move legs 2/2 pain; 5/5 strength RUE; mild dysarthria but no aphasia  Psychiatric:        Judgment: Judgment normal.      ED Treatments / Results  Labs (all labs ordered are listed, but only abnormal results are displayed) Labs Reviewed  PROTIME-INR - Abnormal; Notable for the following components:      Result Value   Prothrombin Time 23.2 (*)  INR 2.1 (*)    All other components within normal limits  APTT - Abnormal; Notable for the following components:   aPTT 63 (*)    All other components within normal limits  CBC - Abnormal; Notable for the following components:   WBC 11.0 (*)    RBC 5.31 (*)    MCH 25.8 (*)    RDW 17.2 (*)    Platelets 149 (*)    All other components within normal limits  DIFFERENTIAL - Abnormal; Notable for the following components:   Neutro Abs 7.8 (*)    All other components within normal limits  COMPREHENSIVE METABOLIC PANEL - Abnormal; Notable for the following components:   BUN 46 (*)    Creatinine, Ser 1.15 (*)    Albumin 3.3 (*)    Alkaline Phosphatase 128 (*)    GFR calc non Af Amer 44 (*)    GFR calc Af Amer 51 (*)    All other components within normal limits  I-STAT CREATININE, ED - Abnormal; Notable for the following components:   Creatinine, Ser 1.20 (*)    All other components within normal limits  SARS CORONAVIRUS 2 (HOSPITAL ORDER, PERFORMED IN [place] [facility])  CULTURE, BLOOD (ROUTINE X 2)  CULTURE, BLOOD (ROUTINE X 2)  URINALYSIS, ROUTINE W REFLEX MICROSCOPIC  LACTIC ACID, PLASMA  LACTIC ACID, PLASMA     EKG None  Radiology Ct Head Wo Contrast  Result Date: 06/11/2018 CLINICAL DATA:  Left-sided weakness. Left-sided facial droop. Worsened confusion. EXAM: CT HEAD WITHOUT CONTRAST TECHNIQUE: Contiguous axial images were obtained from the base of the skull through the vertex without intravenous contrast. COMPARISON:  10/05/2017 FINDINGS: Brain: The brainstem and cerebellum are unremarkable. Left cerebral hemisphere shows minimal small vessel change of the white matter. On the right, there has been old infarction in the right basal ganglia and insula which has progressed to atrophy, encephalomalacia and gliosis. There is old appearing infarction in the right parietal lobe and temporoparietal junction, which was not visibly affected in August of last year. This does not appear acute however. There is no identifiable acute infarction. No mass lesion, hemorrhage, hydrocephalus or extra-axial collection. Vascular: There is atherosclerotic calcification of the major vessels at the base of the brain. Skull: Negative Sinuses/Orbits: Some fluid layering in the right division of the sphenoid sinus. Other sinuses are clear. Orbits are negative. 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 The area of involvement is more extensive than was seen in August of last year, with more involvement of the temporoparietal junction and posterior parietal lobe, but the findings do not look acute today. No hemorrhage or mass effect. Electronically Signed   By: Paulina Fusi M.D.   On: 06/11/2018 10:17   Ct Femur Left W Contrast  Result Date: 06/11/2018 CLINICAL DATA:  Left thigh swelling and pain. Concern for soft tissue infection. EXAM: CT OF THE LOWER RIGHT EXTREMITY WITH CONTRAST TECHNIQUE: Multidetector CT imaging of the lower right extremity was performed according to the standard protocol following intravenous contrast administration. COMPARISON:  Left femur x-rays  from same day. CONTRAST:  OMNIPAQUE IOHEXOL 300 MG/ML  SOLN FINDINGS: Bones/Joint/Cartilage No acute fracture or dislocation. No osseous destruction or periosteal reaction. Sclerosis within the left femoral head. Moderate left hip joint space narrowing. Prior left total knee arthroplasty. No evidence of hardware failure or loosening. No hip or knee joint effusion. Ligaments Suboptimally assessed by CT. Muscles and Tendons Grossly  intact. Severe left gluteus medius muscle atrophy. Unchanged small lipoma in the proximal lateral gastrocnemius muscle. Soft tissues In the superficial medial aspect of the left mid thigh, there is a 3.5 x 2.0 by 1.8 cm complex, predominantly low-density lesion with thick rim enhancement. There is prominent overlying skin thickening of the medial thigh and mild surrounding fat stranding. No subcutaneous emphysema. Large stool ball in the rectum.  No lymphadenopathy. IMPRESSION: 1. 3.5 cm complex, predominantly low-density lesion with thick rim enhancement in the superficial medial aspect of the left mid thigh. Appearance is most suggestive of an abscess given overlying skin thickening and surrounding soft tissue inflammatory changes suggestive of cellulitis, however a necrotic mass could have a similar appearance. 2. No subcutaneous emphysema or deeper soft tissue infection. 3. 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 Electronically Signed   By: Obie Dredge M.D.   On: 06/11/2018 11:55   Dg Femur Portable Min 2 Views Left  Result Date: 06/11/2018 CLINICAL DATA:  Left medial thigh infection. EXAM: LEFT FEMUR PORTABLE 2 VIEWS COMPARISON:  None. FINDINGS: One-view does not show any bone abnormality or radiopaque foreign object. IMPRESSION: No specific or significant finding on this one view examination. Electronically Signed   By: Paulina Fusi M.D.   On: 06/11/2018 09:58    Procedures Ultrasound ED Soft Tissue  Date/Time: 06/11/2018 9:25 AM Performed by: Laurence Spates, MD Authorized by: Laurence Spates, MD   Procedure details:    Indications: limb pain, localization of abscess and evaluate for cellulitis     Transverse view:  Visualized   Longitudinal view:  Visualized   Images: archived     Limitations:  Patient compliance Location:    Location: lower extremity     Side:  Left Findings:     abscess present Comments:     Well-circumscribed circular fluid collection with thick borders, no color flow   (including critical care time)  Medications Ordered in ED Medications - No data to display   Initial Impression / Assessment and Plan / ED Course  I have reviewed the triage vital signs and the nursing notes.  Pertinent labs & imaging results that were available during my care of the patient were reviewed by me and considered in my medical decision making (see chart for details).       Pt alert on exam, VSS. L sided deficits as above. She had an area concerning for cellulitis +/- deep space infection on L thigh, tender to palpation. Bedside US shows fluid collection that is very well circumscribed, unusual for an abscess but with no color flow to suggest vascular process. Obtained CT for better evaluation.   WBC 11, Cr 1.2, normal lactate and UA. Negative COVID 19. Head CT with chronic changes. CT leg shows 3.5 cm complex fluid collection likely abscess. Given she is currently anticoagulated, I feel she should have medication held for 24h prior to drainage. Furthermore, she may require sedation given location and inability to cooperate with exam. Will admit for IV antibiotics and likely surgical consultation tomorrow. Gave Vanc and cefazolin for cellulitis coverage. Discussed admission w/ [facility], Dr. Katrinka Blazing and pt admitted for further care. Final Clinical Impressions(s) / ED Diagnoses   Final diagnoses:  Abscess of left thigh  Left-sided weakness    ED Discharge Orders     None       Little, Ambrose Finland, MD 06/11/18 1536

## 2018-06-12 ENCOUNTER — Encounter (HOSPITAL_COMMUNITY): Payer: Self-pay | Admitting: Cardiology

## 2018-06-12 DIAGNOSIS — Z008 Encounter for other general examination: Secondary | ICD-10-CM | POA: Diagnosis not present

## 2018-06-12 DIAGNOSIS — D696 Thrombocytopenia, unspecified: Secondary | ICD-10-CM | POA: Diagnosis present

## 2018-06-12 DIAGNOSIS — I4891 Unspecified atrial fibrillation: Secondary | ICD-10-CM

## 2018-06-12 DIAGNOSIS — R4182 Altered mental status, unspecified: Secondary | ICD-10-CM | POA: Diagnosis not present

## 2018-06-12 DIAGNOSIS — G9341 Metabolic encephalopathy: Secondary | ICD-10-CM | POA: Diagnosis present

## 2018-06-12 DIAGNOSIS — Z79899 Other long term (current) drug therapy: Secondary | ICD-10-CM | POA: Diagnosis not present

## 2018-06-12 DIAGNOSIS — I69398 Other sequelae of cerebral infarction: Secondary | ICD-10-CM | POA: Diagnosis not present

## 2018-06-12 DIAGNOSIS — R001 Bradycardia, unspecified: Secondary | ICD-10-CM | POA: Diagnosis present

## 2018-06-12 DIAGNOSIS — I5032 Chronic diastolic (congestive) heart failure: Secondary | ICD-10-CM | POA: Diagnosis present

## 2018-06-12 DIAGNOSIS — Z1159 Encounter for screening for other viral diseases: Secondary | ICD-10-CM | POA: Diagnosis not present

## 2018-06-12 DIAGNOSIS — Z515 Encounter for palliative care: Secondary | ICD-10-CM | POA: Diagnosis not present

## 2018-06-12 DIAGNOSIS — L02416 Cutaneous abscess of left lower limb: Secondary | ICD-10-CM | POA: Diagnosis present

## 2018-06-12 DIAGNOSIS — N179 Acute kidney failure, unspecified: Secondary | ICD-10-CM | POA: Diagnosis present

## 2018-06-12 DIAGNOSIS — Z9981 Dependence on supplemental oxygen: Secondary | ICD-10-CM | POA: Diagnosis not present

## 2018-06-12 DIAGNOSIS — I110 Hypertensive heart disease with heart failure: Secondary | ICD-10-CM | POA: Diagnosis present

## 2018-06-12 DIAGNOSIS — G4733 Obstructive sleep apnea (adult) (pediatric): Secondary | ICD-10-CM | POA: Diagnosis present

## 2018-06-12 DIAGNOSIS — I4821 Permanent atrial fibrillation: Secondary | ICD-10-CM | POA: Diagnosis present

## 2018-06-12 DIAGNOSIS — F419 Anxiety disorder, unspecified: Secondary | ICD-10-CM | POA: Diagnosis present

## 2018-06-12 DIAGNOSIS — I69354 Hemiplegia and hemiparesis following cerebral infarction affecting left non-dominant side: Secondary | ICD-10-CM | POA: Diagnosis not present

## 2018-06-12 DIAGNOSIS — J9610 Chronic respiratory failure, unspecified whether with hypoxia or hypercapnia: Secondary | ICD-10-CM | POA: Diagnosis present

## 2018-06-12 DIAGNOSIS — K5900 Constipation, unspecified: Secondary | ICD-10-CM | POA: Diagnosis present

## 2018-06-12 DIAGNOSIS — F0390 Unspecified dementia without behavioral disturbance: Secondary | ICD-10-CM | POA: Diagnosis present

## 2018-06-12 DIAGNOSIS — E785 Hyperlipidemia, unspecified: Secondary | ICD-10-CM | POA: Diagnosis present

## 2018-06-12 DIAGNOSIS — R531 Weakness: Secondary | ICD-10-CM | POA: Diagnosis present

## 2018-06-12 DIAGNOSIS — L03116 Cellulitis of left lower limb: Secondary | ICD-10-CM | POA: Diagnosis present

## 2018-06-12 DIAGNOSIS — Z7901 Long term (current) use of anticoagulants: Secondary | ICD-10-CM | POA: Diagnosis not present

## 2018-06-12 DIAGNOSIS — G8929 Other chronic pain: Secondary | ICD-10-CM | POA: Diagnosis present

## 2018-06-12 DIAGNOSIS — E873 Alkalosis: Secondary | ICD-10-CM | POA: Diagnosis present

## 2018-06-12 DIAGNOSIS — I6930 Unspecified sequelae of cerebral infarction: Secondary | ICD-10-CM | POA: Diagnosis not present

## 2018-06-12 DIAGNOSIS — R627 Adult failure to thrive: Secondary | ICD-10-CM | POA: Diagnosis not present

## 2018-06-12 DIAGNOSIS — M879 Osteonecrosis, unspecified: Secondary | ICD-10-CM | POA: Diagnosis present

## 2018-06-12 LAB — BLOOD CULTURE ID PANEL (REFLEXED)

## 2018-06-12 LAB — BASIC METABOLIC PANEL
Anion gap: 11 (ref 5–15)
BUN: 37 mg/dL — ABNORMAL HIGH (ref 8–23)
CO2: 25 mmol/L (ref 22–32)
Calcium: 9 mg/dL (ref 8.9–10.3)
Chloride: 105 mmol/L (ref 98–111)
Creatinine, Ser: 1.14 mg/dL — ABNORMAL HIGH (ref 0.44–1.00)
GFR calc Af Amer: 51 mL/min — ABNORMAL LOW (ref >60)
GFR calc non Af Amer: 44 mL/min — ABNORMAL LOW (ref >60)
Glucose, Bld: 90 mg/dL (ref 70–99)
Potassium: 4.3 mmol/L (ref 3.5–5.1)
Sodium: 141 mmol/L (ref 135–145)

## 2018-06-12 LAB — MRSA PCR SCREENING: MRSA by PCR: POSITIVE — AB

## 2018-06-12 LAB — CBC
HCT: 40.2 % (ref 36.0–46.0)
Hemoglobin: 12.6 g/dL (ref 12.0–15.0)
MCH: 25.8 pg — ABNORMAL LOW (ref 26.0–34.0)
MCHC: 31.3 g/dL (ref 30.0–36.0)
MCV: 82.2 fL (ref 80.0–100.0)
Platelets: 146 10*3/uL — ABNORMAL LOW (ref 150–400)
RBC: 4.89 MIL/uL (ref 3.87–5.11)
RDW: 17.2 % — ABNORMAL HIGH (ref 11.5–15.5)
WBC: 13.6 10*3/uL — ABNORMAL HIGH (ref 4.0–10.5)
nRBC: 0 % (ref 0.0–0.2)

## 2018-06-12 LAB — PROTIME-INR
INR: 2.2 — ABNORMAL HIGH (ref 0.8–1.2)
Prothrombin Time: 23.9 seconds — ABNORMAL HIGH (ref 11.4–15.2)

## 2018-06-12 LAB — MAGNESIUM: Magnesium: 2.1 mg/dL (ref 1.7–2.4)

## 2018-06-12 LAB — APTT
aPTT: 63 seconds — ABNORMAL HIGH (ref 24–36)
aPTT: 114 seconds — ABNORMAL HIGH (ref 24–36)

## 2018-06-12 MED ORDER — HEPARIN (PORCINE) 25000 UT/250ML-% IV SOLN
700.0000 [IU]/h | INTRAVENOUS | Status: DC
  Administered 2018-06-12: 19:00:00 1000 [IU]/h via INTRAVENOUS
  Filled 2018-06-12: qty 250

## 2018-06-12 NOTE — Consult Note (Signed)
[facility] Face-to-Face Psychiatry Consult   Reason for Consult:  Capacity evaluation  Referring Physician:  Dr. Hartley Barefoot  Patient Identification: Layza Canteen MRN:  [id number] Principal Diagnosis: Evaluation by psychiatric service required Diagnosis:  Principal Problem:   Cellulitis and abscess of left leg Active Problems:   Atrial fibrillation (HCC)   OSA (obstructive sleep apnea)   History of CVA with residual deficit   Bradycardia   AKI (acute kidney injury) (HCC)   Thrombocytopenia (HCC)   Total Time spent with patient: 1 hour  Subjective:   Dezarey Hannigan is a 85 y.o. female patient admitted with altered mental status.  HPI:   Per chart review, patient was admitted with altered mental status. She has a history of dementia. She was recently started on Keflex on 5/4 for cellulitis and abscess of left leg. She has been agitated and refusing care since hospitalization. She requires a I&D for left thigh abscess so psychiatry was consulted to determine if patient has capacity to consent to surgery. She has been refusing medical care including vitals.   On interview, Ms. Bressi reports that she does not know what medical condition for which she is receiving treatment. She reports, "There are constant nurses and doctors who need to back off. I want to make my own decisions. I want [facility] to back off of me and call of their watch dogs."  She initially reports that she is unaware of what treatments have been discussed for her leg infection. She later reports "they want to go in there and spread the infection around" after she is informed about the planned I&D. She is able to name a couple of her medical conditions and medications that she is prescribed. She endorses mild anxiety. She denies a prior psychiatric history. She denies SI, HI or AVH. She is oriented to person and place. She reports that the year is 2030 and later reports 2021.   Past Psychiatric History: Denies    Risk to Self:  None. Denies SI.  Risk to Others:  None. Denies HI. Prior Inpatient Therapy:  Denies  Prior Outpatient Therapy:  Denies   Past Medical History:  Past Medical History:  Diagnosis Date  . Atrial fibrillation (HCC)   . Hyperlipidemia   . Hypertension   . OSA (obstructive sleep apnea)    History reviewed. No pertinent surgical history. Family History:  Family History  Problem Relation Age of Onset  . Heart disease Mother    Family Psychiatric  History: None per chart review.  Social History:  Social History   Substance and Sexual Activity  Alcohol Use Not on file     Social History   Substance and Sexual Activity  Drug Use Not on file    Social History   Socioeconomic History  . Marital status: Single    Spouse name: Not on file  . Number of children: Not on file  . Years of education: Not on file  . Highest education level: Not on file  Occupational History  . Not on file  Social Needs  . Financial resource strain: Not on file  . Food insecurity:    Worry: Not on file    Inability: Not on file  . Transportation needs:    Medical: Not on file    Non-medical: Not on file  Tobacco Use  . Smoking status: Never Smoker  . Smokeless tobacco: Never Used  Substance and Sexual Activity  . Alcohol use: Not on file  . Drug use: Not  on file  . Sexual activity: Not on file  Lifestyle  . Physical activity:    Days per week: Not on file    Minutes per session: Not on file  . Stress: Not on file  Relationships  . Social connections:    Talks on phone: Not on file    Gets together: Not on file    Attends religious service: Not on file    Active member of club or organization: Not on file    Attends meetings of clubs or organizations: Not on file    Relationship status: Not on file  Other Topics Concern  . Not on file  Social History Narrative  . Not on file   Additional Social History: N/A    Allergies:   Allergies  Allergen Reactions  .  Zanaflex [Tizanidine Hcl] Shortness Of Breath, Other (See Comments) and Cough    Dehydration - reported by [facility] 10/07/13  . Alprazolam Other (See Comments)    Dry mouth - reported by [facility] 05/13/14   . Carisoprodol Other (See Comments)    Dizziness - reported by [facility] 05/13/14   . Chlorthalidone Other (See Comments)    syncope  . Colesevelam Other (See Comments)    GI upset and stool change - reported by [facility] 05/13/14   . Dilaudid [Hydromorphone Hcl] Hives and Swelling  . Duloxetine Other (See Comments)    Dizziness - reported by [facility] 05/13/14   . Felodipine Other (See Comments)    Dizziness, groin pain - reported by [facility] 05/13/14   . Fosamax [Alendronate Sodium] Nausea And Vomiting and Other (See Comments)    Back and hip pain  . Furosemide Other (See Comments)    Dry mouth/dizziness - reported by [facility] 05/13/14   . Gabapentin Other (See Comments)    Insomnia - reported by [facility] 05/13/14   . Ibuprofen Other (See Comments)    GI pain - reported by [facility] 05/13/14   . Lovastatin Other (See Comments)    Weakness - reported by [facility] 05/13/14   . Meclizine Other (See Comments)    Sleeplessness - reported by [facility] 05/13/14   . Meperidine Other (See Comments)    Jitteriness - reported by [facility] 05/13/14   . Methadone Other (See Comments)    Stool impaction - reported by [facility] 05/13/14   . Morphine Other (See Comments)    Dizziness - reported by [facility] 05/13/14   . Niacin Other (See Comments)    Flushing - reported by [facility] 05/13/14   . Omeprazole Other (See Comments)    Sleeplessness - reported by [facility] 05/13/14   . Oxycodone Other (See Comments)    Groin pain - reported by [facility] 05/13/14   . Pravastatin Other (See Comments)    Weakness - reported by [facility] 05/13/14   . Simvastatin Other (See Comments)    Weakness -  reported by [facility] 05/13/14   . Statins Other (See Comments)    Myalgia, weakness - reported by [facility] 05/13/14  . Tramadol Other (See Comments)    Nervous - reported by [facility] 05/13/14   . Valsartan Other (See Comments)    Dizziness - reported by [facility] 05/13/14   . Hydrocodone-Acetaminophen Nausea And Vomiting    reported by  [facility] 05/13/14     Labs:  Results for orders placed or performed during the hospital encounter of 06/11/18 (from the past 48 hour(s))  Protime-INR     Status: Abnormal   Collection Time: 06/11/18  9:18 AM  Result Value Ref Range   Prothrombin Time 23.2 (H) 11.4 - 15.2 seconds   INR 2.1 (H) 0.8 - 1.2    Comment: (NOTE) INR goal varies based on device and disease states. Performed at [facility], [address] [place], [place] [address]   APTT     Status: Abnormal   Collection Time: 06/11/18  9:18 AM  Result Value Ref Range   aPTT 63 (H) 24 - 36 seconds    Comment:        IF BASELINE aPTT IS ELEVATED, SUGGEST PATIENT RISK ASSESSMENT BE USED TO DETERMINE APPROPRIATE ANTICOAGULANT THERAPY. Performed at [facility], [address], [place], [place] [address]   CBC     Status: Abnormal   Collection Time: 06/11/18  9:18 AM  Result Value Ref Range   WBC 11.0 (H) 4.0 - 10.5 K/uL   RBC 5.31 (H) 3.87 - 5.11 MIL/uL   Hemoglobin 13.7 12.0 - 15.0 g/dL   HCT 74.2 59.5 - 63.8 %   MCV 83.4 80.0 - 100.0 fL   MCH 25.8 (L) 26.0 - 34.0 pg   MCHC 30.9 30.0 - 36.0 g/dL   RDW 75.6 (H) 43.3 - 29.5 %   Platelets 149 (L) 150 - 400 K/uL    Comment: REPEATED TO VERIFY   nRBC 0.0 0.0 - 0.2 %    Comment: Performed at [facility], [address] [place], [place] [address]  Differential     Status: Abnormal   Collection Time: 06/11/18  9:18 AM  Result Value Ref Range   Neutrophils Relative % 70 %   Neutro Abs 7.8 (H) 1.7 - 7.7 K/uL   Lymphocytes Relative 19 %   Lymphs Abs 2.1 0.7 - 4.0 K/uL   Monocytes  Relative 7 %   Monocytes Absolute 0.8 0.1 - 1.0 K/uL   Eosinophils Relative 3 %   Eosinophils Absolute 0.3 0.0 - 0.5 K/uL   Basophils Relative 0 %   Basophils Absolute 0.0 0.0 - 0.1 K/uL   Immature Granulocytes 1 %   Abs Immature Granulocytes 0.05 0.00 - 0.07 K/uL    Comment: Performed at [facility], [address] [place], [place] [address]  Comprehensive metabolic panel     Status: Abnormal   Collection Time: 06/11/18  9:18 AM  Result Value Ref Range   Sodium 141 135 - 145 mmol/L   Potassium 4.2 3.5 - 5.1 mmol/L   Chloride 101 98 - 111 mmol/L   CO2 28 22 - 32 mmol/L   Glucose, Bld 77 70 - 99 mg/dL   BUN 46 (H) 8 - 23 mg/dL   Creatinine, Ser 0.16 (H) 0.44 - 1.00 mg/dL   Calcium 9.7 8.9 - 01.0 mg/dL   Total Protein 7.1 6.5 - 8.1 g/dL   Albumin 3.3 (L) 3.5 - 5.0 g/dL   AST 31 15 - 41 U/L   ALT 22 0 - 44 U/L   Alkaline Phosphatase 128 (H) 38 - 126 U/L   Total Bilirubin 0.9 0.3 - 1.2 mg/dL   GFR calc non Af Amer 44 (L) >60 mL/min   GFR calc Af Amer 51 (L) >60 mL/min   Anion gap 12 5 - 15  Comment: Performed at [facility], [address] [place], [place] [address]  Urinalysis, Routine w reflex microscopic     Status: None   Collection Time: 06/11/18  9:18 AM  Result Value Ref Range   Color, Urine YELLOW YELLOW   APPearance CLEAR CLEAR   Specific Gravity, Urine 1.010 1.005 - 1.030   pH 7.0 5.0 - 8.0   Glucose, UA NEGATIVE NEGATIVE mg/dL   Hgb urine dipstick NEGATIVE NEGATIVE   Bilirubin Urine NEGATIVE NEGATIVE   Ketones, ur NEGATIVE NEGATIVE mg/dL   Protein, ur NEGATIVE NEGATIVE mg/dL   Nitrite NEGATIVE NEGATIVE   Leukocytes,Ua NEGATIVE NEGATIVE    Comment: Microscopic not done on urines with negative protein, blood, leukocytes, nitrite, or glucose < 500 mg/dL. Performed at [facility], [address] [place], [place] [address]   Culture, blood (routine x 2)     Status: None (Preliminary result)   Collection Time: 06/11/18  9:18 AM  Result Value  Ref Range   Specimen Description BLOOD RIGHT ANTECUBITAL    Special Requests      BOTTLES DRAWN AEROBIC AND ANAEROBIC Blood Culture adequate volume   Culture      NO GROWTH < 24 HOURS Performed at [facility], [address] [place], [place] [address]    Report Status PENDING   Culture, blood (routine x 2)     Status: None (Preliminary result)   Collection Time: 06/11/18  9:18 AM  Result Value Ref Range   Specimen Description BLOOD BLOOD RIGHT FOREARM    Special Requests      BOTTLES DRAWN AEROBIC AND ANAEROBIC Blood Culture adequate volume   Culture      NO GROWTH < 24 HOURS Performed at [facility], [address] [place], [place] [address]    Report Status PENDING   Lactic acid, plasma     Status: None   Collection Time: 06/11/18  9:18 AM  Result Value Ref Range   Lactic Acid, Venous 0.9 0.5 - 1.9 mmol/L    Comment: Performed at [facility], [address] [place], [place] [address]  SARS Coronavirus 2 (CEPHEID - Performed in [facility] hospital lab), Hosp Order     Status: None   Collection Time: 06/11/18  9:18 AM  Result Value Ref Range   SARS Coronavirus 2 NEGATIVE NEGATIVE    Comment: (NOTE) If result is NEGATIVE SARS-CoV-2 target nucleic acids are NOT DETECTED. The SARS-CoV-2 RNA is generally detectable in upper and lower  respiratory specimens during the acute phase of infection. The lowest  concentration of SARS-CoV-2 viral copies this assay can detect is 250  copies / mL. A negative result does not preclude SARS-CoV-2 infection  and should not be used as the sole basis for treatment or other  patient management decisions.  A negative result may occur with  improper specimen collection / handling, submission of specimen other  than nasopharyngeal swab, presence of viral mutation(s) within the  areas targeted by this assay, and inadequate number of viral copies  (<250 copies / mL). A negative result must be combined with clinical   observations, patient history, and epidemiological information. If result is POSITIVE SARS-CoV-2 target nucleic acids are DETECTED. The SARS-CoV-2 RNA is generally detectable in upper and lower  respiratory specimens dur ing the acute phase of infection.  Positive  results are indicative of active infection with SARS-CoV-2.  Clinical  correlation with patient history and other diagnostic information is  necessary to determine patient infection status.  Positive results do  not rule out bacterial infection or co-infection with other viruses. If result is PRESUMPTIVE POSTIVE SARS-CoV-2 nucleic acids MAY BE PRESENT.   A presumptive positive result was obtained on the submitted specimen  and confirmed on repeat testing.  While 2019 novel coronavirus  (SARS-CoV-2) nucleic acids may be present in the submitted sample  additional confirmatory testing may be necessary for epidemiological  and / or clinical management purposes  to differentiate between  SARS-CoV-2 and other Sarbecovirus currently known to infect humans.  If clinically indicated additional testing with an alternate test  methodology [phone]) is advised. The SARS-CoV-2 RNA is generally  detectable in upper and lower respiratory sp ecimens during the acute  phase of infection. The expected result is Negative. Fact Sheet for Patients:  BoilerBrush.com.cy Fact Sheet for Healthcare Providers: https://pope.com/ This test is not yet approved or cleared by the [place] FDA and has been authorized for detection and/or diagnosis of SARS-CoV-2 by FDA under an Emergency Use Authorization (EUA).  This EUA will remain in effect (meaning this test can be used) for the duration of the COVID-19 declaration under Section 564(b)(1) of the Act, 21 U.S.C. section 360bbb-3(b)(1), unless the authorization is terminated or revoked sooner. Performed at [facility], [address]  [place], [place] [address]   I-stat Creatinine, ED     Status: Abnormal   Collection Time: 06/11/18  9:29 AM  Result Value Ref Range   Creatinine, Ser 1.20 (H) 0.44 - 1.00 mg/dL  Lactic acid, plasma     Status: None   Collection Time: 06/11/18 11:13 AM  Result Value Ref Range   Lactic Acid, Venous 1.0 0.5 - 1.9 mmol/L    Comment: Performed at [facility], [address] [place], [place] [address]  MRSA PCR Screening     Status: Abnormal   Collection Time: 06/11/18 11:55 PM  Result Value Ref Range   MRSA by PCR POSITIVE (A) NEGATIVE    Comment:        The GeneXpert MRSA Assay (FDA approved for NASAL specimens only), is one component of a comprehensive MRSA colonization surveillance program. It is not intended to diagnose MRSA infection nor to guide or monitor treatment for MRSA infections. RESULT CALLED TO, READ BACK BY AND VERIFIED WITH: L. Sheliah Plane [id number] 06/12/2018 Girtha Hake Performed at [facility], [address] [place], [place] [address]   Basic metabolic panel     Status: Abnormal   Collection Time: 06/12/18  5:42 AM  Result Value Ref Range   Sodium 141 135 - 145 mmol/L   Potassium 4.3 3.5 - 5.1 mmol/L   Chloride 105 98 - 111 mmol/L   CO2 25 22 - 32 mmol/L   Glucose, Bld 90 70 - 99 mg/dL   BUN 37 (H) 8 - 23 mg/dL   Creatinine, Ser 3.08 (H) 0.44 - 1.00 mg/dL   Calcium 9.0 8.9 - 65.7 mg/dL   GFR calc non Af Amer 44 (L) >60 mL/min   GFR calc Af Amer 51 (L) >60 mL/min   Anion gap 11 5 - 15    Comment: Performed at [facility], [address] [place], [place] [address]  APTT     Status: Abnormal   Collection Time: 06/12/18  5:42 AM  Result Value Ref Range   aPTT 63 (H) 24 - 36 seconds    Comment:  IF BASELINE aPTT IS ELEVATED, SUGGEST PATIENT RISK ASSESSMENT BE USED TO DETERMINE APPROPRIATE ANTICOAGULANT THERAPY. Performed at [facility], [address], [place], [place] [address]   Protime-INR     Status: Abnormal   Collection  Time: 06/12/18  5:42 AM  Result Value Ref Range   Prothrombin Time 23.9 (H) 11.4 - 15.2 seconds   INR 2.2 (H) 0.8 - 1.2    Comment: (NOTE) INR goal varies based on device and disease states. Performed at [facility], [address], [place], [place] [address]   CBC     Status: Abnormal   Collection Time: 06/12/18  5:42 AM  Result Value Ref Range   WBC 13.6 (H) 4.0 - 10.5 K/uL   RBC 4.89 3.87 - 5.11 MIL/uL   Hemoglobin 12.6 12.0 - 15.0 g/dL   HCT 09.8 11.9 - 14.7 %   MCV 82.2 80.0 - 100.0 fL   MCH 25.8 (L) 26.0 - 34.0 pg   MCHC 31.3 30.0 - 36.0 g/dL   RDW 82.9 (H) 56.2 - 13.0 %   Platelets 146 (L) 150 - 400 K/uL   nRBC 0.0 0.0 - 0.2 %    Comment: Performed at [facility], [address] [place], [place] [address]  Magnesium     Status: None   Collection Time: 06/12/18  5:42 AM  Result Value Ref Range   Magnesium 2.1 1.7 - 2.4 mg/dL    Comment: Performed at [facility], [address] [place], [place] [address]    Current Facility-Administered Medications  Medication Dose Route Frequency Provider Last Rate Last Dose  . 0.9 %  sodium chloride infusion   Intravenous Continuous Madelyn Flavors A, MD 50 mL/hr at 06/11/18 2205    . acetaminophen (TYLENOL) tablet 650 mg  650 mg Oral Q6H PRN Clydie Braun, MD       Or  . acetaminophen (TYLENOL) suppository 650 mg  650 mg Rectal Q6H PRN Smith, Rondell A, MD      . albuterol (PROVENTIL) (2.5 MG/3ML) 0.083% nebulizer solution 2.5 mg  2.5 mg Nebulization Q4H PRN Smith, Rondell A, MD      . atorvastatin (LIPITOR) tablet 40 mg  40 mg Oral q1800 Madelyn Flavors A, MD   40 mg at 06/11/18 1832  . calcium carbonate (TUMS - dosed in mg elemental calcium) chewable tablet 200 mg of elemental calcium  1 tablet Oral TID AC Smith, Rondell A, MD   200 mg of elemental calcium at 06/11/18 1833  . ceFAZolin (ANCEF) IVPB 1 g/50 mL premix  1 g Intravenous Q8H Smith, Rondell A, MD 100 mL/hr at 06/12/18 0322 1 g at 06/12/18 0322  .  chlorhexidine (PERIDEX) 0.12 % solution 15 mL  15 mL Mouth/Throat BID Madelyn Flavors A, MD   15 mL at 06/12/18 0323  . ondansetron (ZOFRAN) tablet 4 mg  4 mg Oral Q6H PRN Madelyn Flavors A, MD       Or  . ondansetron (ZOFRAN) injection 4 mg  4 mg Intravenous Q6H PRN Smith, Rondell A, MD      . oxyCODONE (Oxy IR/ROXICODONE) immediate release tablet 5 mg  5 mg Oral Q4H PRN Smith, Rondell A, MD      . pantoprazole (PROTONIX) EC tablet 40 mg  40 mg Oral Q0600 Madelyn Flavors A, MD   40 mg at 06/11/18 1832  . phenol (CHLORASEPTIC) mouth spray 2 spray  2 spray Mouth/Throat Q2H PRN Katrinka Blazing, Rondell A,  MD      . polyethylene glycol (MIRALAX / GLYCOLAX) packet 17 g  17 g Oral Daily PRN Katrinka Blazing, Rondell A, MD      . pregabalin (LYRICA) capsule 25 mg  25 mg Oral Q8H Smith, Rondell A, MD   25 mg at 06/12/18 0445  . sodium chloride flush (NS) 0.9 % injection 3 mL  3 mL Intravenous Q12H Smith, Rondell A, MD   3 mL at 06/12/18 0323  . [START ON 06/13/2018] vancomycin (VANCOCIN) 1,500 mg in sodium chloride 0.9 % 500 mL IVPB  1,500 mg Intravenous Q48H Little, Ambrose Finland, MD        Musculoskeletal: Strength & Muscle Tone: within normal limits Gait & Station: UTA since patient is lying in bed. Patient leans: N/A  Psychiatric Specialty Exam: Physical Exam  Nursing note and vitals reviewed. Constitutional: She appears well-developed and well-nourished.  HENT:  Head: Normocephalic and atraumatic.  Neck: Normal range of motion.  Respiratory: Effort normal.  Musculoskeletal: Normal range of motion.  Neurological: She is alert.  Oriented to person and place.  Psychiatric: Her speech is normal and behavior is normal. Judgment and thought content normal. Her mood appears anxious. Cognition and memory are impaired.    Review of Systems  Musculoskeletal:       Left leg pain.  Psychiatric/Behavioral: Negative for depression, hallucinations and suicidal ideas. The patient is nervous/anxious.   All other systems  reviewed and are negative.   Blood pressure 119/60, pulse (!) 47, temperature 97.7 F (36.5 C), temperature source Oral, resp. rate 18, height 5' (1.524 m), weight 82.2 kg, SpO2 100 %.Body mass index is 35.39 kg/m.  General Appearance: Fairly Groomed, elderly, Caucasian female, wearing a hospital gown with gray hair who is lying in bed. NAD.   Eye Contact:  Good  Speech:  Clear and Coherent and Normal Rate  Volume:  Normal  Mood:  Anxious  Affect:  Congruent  Thought Process:  Linear and Descriptions of Associations: Intact  Orientation:  Other:  Oriented to person and place.  Thought Content:  Illogical  Suicidal Thoughts:  No  Homicidal Thoughts:  No  Memory:  Immediate;   Poor Recent;   Fair Remote;   Fair  Judgement:  Impaired  Insight:  Lacking  Psychomotor Activity:  Normal  Concentration:  Concentration: Fair and Attention Span: Fair  Recall:  Poor  Fund of Knowledge:  Fair  Language:  Fair  Akathisia:  No  Handed:  Right  AIMS (if indicated):   N/A  Assets:  Housing  ADL's:  Impaired  Cognition:  Impaired with short term memory deficits.   Sleep:   N/A   Assessment:  Veanna Dower is a 85 y.o. female who was admitted with altered mental status in the setting of cellulitis and abscess of left leg. Patient lacks capacity to consent to surgery and refuse current medical treatment. She does not demonstrate an understanding of her medical condition or the treatments associated with her medical condition. She will need an authorized representative to help her make these medical decisions.   Treatment Plan Summary: -Patient lacks capacity to consent to surgery and refuse current medical treatment. -EKG reviewed and QTc 438 on 5/6. Please closely monitor when starting or increasing QTc prolonging agents.  -Psychiatry will sign off on patient at this time. Please consult psychiatry again as needed.    Disposition: No evidence of imminent risk to self or others at  present.   Patient does not meet criteria for  psychiatric inpatient admission.  Cherly Beach, DO 06/12/2018 10:53 AM

## 2018-06-12 NOTE — Progress Notes (Addendum)
Patient refuse her medication (lyrica, heparin IV and calcium)  Patient is cursing and telling me to get out.   Threatening to rip ou her IV access. Has remove her Purewick and her Telemetry monitor.  Refuse to allow [place] to change her bedding and get her cleaned up   Called MD- MD aware-   Due to Low heart rate cannot give any medication to calm her down.    Will try to place mitten on her and wrap her arm in kerlex.   Patient is still A+O x 3- but is very agitated.

## 2018-06-12 NOTE — Progress Notes (Signed)
Patient has been alert and confused throughout the night, with Bradycardic 30-50, had 3 large bowel movement followed by small bleeding from rectum. Repositioned and reported to day nurse, set up tray and patient refused to eat breakfast.

## 2018-06-12 NOTE — Consult Note (Signed)
Cardiology Consultation:   Patient ID: Margaret Collier MRN: [id number]; DOB: 1931-10-05  Admit date: 06/11/2018 Date of Consult: 06/12/2018  Primary Care Provider: System, Pcp Not In Primary Cardiologist: Merdis Delay, PA-C [place] Primary Electrophysiologist:  None    Patient Profile:   Margaret Collier is a 85 y.o. female with a hx of HTN, HLD, chronic a fib on Eliquis, 02 dependent, CVA with residual lt side weakness, lymphedema, chronic diastolic HF, dementia who is being seen today for the evaluation of bradycardia at the request of Dr. Sunnie Nielsen.   History of Present Illness:   Margaret Collier is followed by cardiology in [place] through [facility]  She has above hx including permanent a fib on Eliquis with CHA2DS2VASc of  7.   CPAP just added for OSA per note 03/11/18.  Echo 10/07/17 with EF 65-70%, diastolic function appears indeterminate mild MR, mild AR  Estimated RVSP .   Pt admitted yesterday with confusion.  On EKG with increased artifact HR was 36.  She has underlying permanent a fib.  During the night HR was 37-56.  On no outpt meds to slow HR.  BP is stable.    LABS  Na 141, K+ 4.3, BUN 37, Cr 1.14 Lactic acid 1.0,  WBC 13.6 Hgb 12.6 Hct 25.8 Plts 146  INR 2.2 and aPTT 63  COVID negative  CT scan of the head without acute process  Receiving fluids for AKI,  And her lasix and spironolactone were held.  Eliquis on hold for need for surgery ok with surgery for IV heparin or lovenox.   Has Thigh abscess--may need I&D, on ABX.   Avascular necrosis of the femoral head was found on CT imaging of Lt leg.     EKG:  The EKG was personally reviewed and demonstrates:  A fib with strong artifact.  Otherwise similar to prior EKGs Telemetry:  Telemetry was personally reviewed and demonstrates:  A fib with rates 30s to 50s   Currently confused --refused EKG.      Past Medical History:  Diagnosis Date  . Atrial fibrillation (HCC)   . Hyperlipidemia   .  Hypertension   . OSA (obstructive sleep apnea)     History reviewed. No pertinent surgical history.   Home Medications:  Prior to Admission medications   Medication Sig Start Date End Date Taking? Authorizing Provider  albuterol (VENTOLIN HFA) 108 (90 Base) MCG/ACT inhaler Inhale 2 puffs into the lungs every 4 (four) hours as needed for wheezing or shortness of breath.   Yes [provider]  apixaban (ELIQUIS) 5 MG TABS tablet Take 1 tablet (5 mg total) by mouth 2 (two) times daily. 01/28/18 06/11/18 Yes Jerald Kief, MD  atorvastatin (LIPITOR) 40 MG tablet Take 40 mg by mouth daily at 6 PM.    Yes [provider]  b complex vitamins capsule Take 1 capsule by mouth daily.   Yes [provider]  calcium carbonate (TUMS - DOSED IN MG ELEMENTAL CALCIUM) 500 MG chewable tablet Chew 1 tablet by mouth 3 (three) times daily before meals.   Yes [provider]  cephALEXin (KEFLEX) 500 MG capsule Take 500 mg by mouth every 8 (eight) hours.   Yes [provider]  chlorhexidine (PERIDEX) 0.12 % solution Use as directed 15 mLs in the mouth or throat 2 (two) times daily.   Yes [provider]  cholecalciferol (VITAMIN D3) 25 MCG (1000 UT) tablet Take 1,000 Units by mouth daily.   Yes [provider]  furosemide (LASIX) 40 MG tablet Take 40 mg by mouth daily.    Yes [provider]  furosemide (LASIX) 80 MG tablet Take 1 tablet (80 mg total) by mouth daily. Patient taking differently: Take 20 mg by mouth as needed for fluid or edema. May have every day PRN for wt gain of 3 lbs in one day or 5 lbs in a week 01/28/18 06/11/18 Yes Jerald Kiefhiu, Stephen K, MD  oxyCODONE-acetaminophen (PERCOCET) 10-325 MG tablet Take 1 tablet by mouth every 12 (twelve) hours. 01/28/18  Yes Jerald Kiefhiu, Stephen K, MD  OXYGEN Inhale 2 L/min into the lungs See admin instructions. Apply via nasal canula daily at bedtime and remove every morning   Yes [provider]   pantoprazole (PROTONIX) 40 MG tablet Take 40 mg by mouth daily at 6 (six) AM.    Yes [provider]  phenol (CHLORASEPTIC) 1.4 % LIQD Use as directed 2 sprays in the mouth or throat every 2 (two) hours as needed for throat irritation / pain. x48   Yes [provider]  polyethylene glycol (MIRALAX / GLYCOLAX) packet Take 17 g by mouth daily as needed for moderate constipation. 01/28/18  Yes Jerald Kiefhiu, Stephen K, MD  potassium chloride SA (K-DUR,KLOR-CON) 20 MEQ tablet Take 20 mEq by mouth daily.   Yes [provider]  pregabalin (LYRICA) 25 MG capsule Take 25 mg by mouth every 8 (eight) hours.    Yes [provider]  spironolactone (ALDACTONE) 25 MG tablet Take 0.5 tablets (12.5 mg total) by mouth daily. 01/29/18 06/11/18 Yes Jerald Kiefhiu, Stephen K, MD    Inpatient Medications: Scheduled Meds: . atorvastatin  40 mg Oral q1800  . calcium carbonate  1 tablet Oral TID AC  . chlorhexidine  15 mL Mouth/Throat BID  . pantoprazole  40 mg Oral Q0600  . pregabalin  25 mg Oral Q8H  . sodium chloride flush  3 mL Intravenous Q12H   Continuous Infusions: . sodium chloride 50 mL/hr at 06/11/18 2205  .  ceFAZolin (ANCEF) IV 1 g (06/12/18 0322)  . [START ON 06/13/2018] vancomycin     PRN Meds: acetaminophen **OR** acetaminophen, albuterol, ondansetron **OR** ondansetron (ZOFRAN) IV, oxyCODONE, phenol, polyethylene glycol  Allergies:    Allergies  Allergen Reactions  . Zanaflex [Tizanidine Hcl] Shortness Of Breath, Other (See Comments) and Cough    Dehydration - reported by [facility] 10/07/13  . Alprazolam Other (See Comments)    Dry mouth - reported by [facility] 05/13/14   . Carisoprodol Other (See Comments)    Dizziness - reported by [facility] 05/13/14   . Chlorthalidone Other (See Comments)    syncope  . Colesevelam Other (See Comments)    GI upset and stool change - [facility] 05/13/14   . Dilaudid [Hydromorphone Hcl] Hives and Swelling  .  Duloxetine Other (See Comments)    Dizziness - [facility] 05/13/14   . Felodipine Other (See Comments)    Dizziness, groin pain - [facility] 05/13/14   . Fosamax [Alendronate Sodium] Nausea And Vomiting and Other (See Comments)    Back and hip pain  . Furosemide Other (See Comments)    Dry mouth/dizziness - [facility] 05/13/14   . Gabapentin Other (See Comments)    Insomnia - reported by [facility] 05/13/14   . Ibuprofen Other (See Comments)    GI pain - reported by [facility]  05/13/14   . Lovastatin Other (See Comments)    Weakness - reported by [facility] 05/13/14   . Meclizine Other (See Comments)    Sleeplessness - reported by [facility] 05/13/14   . Meperidine Other (See Comments)    Jitteriness - reported by [facility] 05/13/14   . Methadone Other (See Comments)    Stool impaction - reported by [facility] 05/13/14   . Morphine Other (See Comments)    Dizziness - reported by [facility] 05/13/14   . Niacin Other (See Comments)    Flushing - reported by [facility] 05/13/14   . Omeprazole Other (See Comments)    Sleeplessness - reported by [facility] 05/13/14   . Oxycodone Other (See Comments)    Groin pain - reported by [facility] 05/13/14   . Pravastatin Other (See Comments)    Weakness - reported by [facility] 05/13/14   . Simvastatin Other (See Comments)    Weakness - reported by [facility] 05/13/14   . Statins Other (See Comments)    Myalgia, weakness - reported by [facility] 05/13/14  . Tramadol Other (See Comments)    Nervous - reported by [facility] 05/13/14   . Valsartan Other (See Comments)    Dizziness - reported by [facility] 05/13/14   . Hydrocodone-Acetaminophen Nausea And Vomiting    reported by [facility] 05/13/14     Social History:   Social History   Socioeconomic History  . Marital status: Single    Spouse name: Not on file  . Number of  children: Not on file  . Years of education: Not on file  . Highest education level: Not on file  Occupational History  . Not on file  Social Needs  . Financial resource strain: Not on file  . Food insecurity:    Worry: Not on file    Inability: Not on file  . Transportation needs:    Medical: Not on file    Non-medical: Not on file  Tobacco Use  . Smoking status: Never Smoker  . Smokeless tobacco: Never Used  Substance and Sexual Activity  . Alcohol use: Not on file  . Drug use: Not on file  . Sexual activity: Not on file  Lifestyle  . Physical activity:    Days per week: Not on file    Minutes per session: Not on file  . Stress: Not on file  Relationships  . Social connections:    Talks on phone: Not on file    Gets together: Not on file    Attends religious service: Not on file    Active member of club or organization: Not on file    Attends meetings of clubs or organizations: Not on file    Relationship status: Not on file  . Intimate partner violence:    Fear of current or ex partner: Not on file    Emotionally abused: Not on file    Physically abused: Not on file    Forced sexual activity: Not on file  Other Topics Concern  . Not on file  Social History Narrative  . Not on file    Family History:    Family History  Problem Relation Age of Onset  . Heart disease Mother      ROS:  Pt has difficulty with memory so obtained from chart.  Please see the history of present illness.  General:no colds or fevers, no weight changes, confusion more than usual Skin:no rashes + ulcers/abscess Lt thigh HEENT:no blurred vision, no congestion CV:see HPI PUL:see HPI GI:no diarrhea constipation or melena, no indigestion GU:no hematuria, no dysuria MS:no joint pain, no claudication, some back pain  Neuro:no syncope, no lightheadedness Endo:no diabetes, no thyroid disease  All other ROS reviewed and negative.     Physical Exam/Data:   Vitals:   06/12/18 0325  06/12/18 0511 06/12/18 0520 06/12/18 0541  BP: 138/74 (!) 120/52 119/60   Pulse: (!) 51 (!) 48 (!) 47   Resp:   18   Temp:      TempSrc:      SpO2: 100% 100% 100%   Weight:    82.2 kg  Height:        Intake/Output Summary (Last 24 hours) at 06/12/2018 0727 Last data filed at 06/12/2018 0200 Gross per 24 hour  Intake 520 ml  Output 0 ml  Net 520 ml   Physical Exam: Blood pressure 119/60, pulse (!) 47, temperature 97.7 F (36.5 C), temperature source Oral, resp. rate 18, height 5' (1.524 m), weight 82.2 kg, SpO2 100 %.  GEN:   Elderly female, NAD ,   HEENT: Normal NECK: No JVD; No carotid bruits LYMPHATICS: No lymphadenopathy CARDIAC: Irreg. Irreg.  RESPIRATORY:  Few basilar rales  ABDOMEN: obese  MUSCULOSKELETAL:  Abscess / cellulitis on top of left thigh.   Tender,  No drainage seen  Legs are large  SKIN: Warm and dry NEUROLOGIC:  Some degree of dementia.   Seems irritable    Relevant CV Studies: Echo 10/07/17 Summary Normal left ventricular size and systolic function with no appreciable segmental abnormality. Ejection fraction is estimated at 65-70% Diastolic function appears indeterminate Normal left ventricular wall thickness Mild mitral annular calcification. Mild mitral regurgitation. Mild AI Mild aortic regurgitation. Estimated RVSP 55 mm Hg.  Laboratory Data:  Chemistry Recent Labs  Lab 06/11/18 0918 06/11/18 0929 06/12/18 0542  NA 141  --  141  K 4.2  --  4.3  CL 101  --  105  CO2 28  --  25  GLUCOSE 77  --  90  BUN 46*  --  37*  CREATININE 1.15* 1.20* 1.14*  CALCIUM 9.7  --  9.0  GFRNONAA 44*  --  44*  GFRAA 51*  --  51*  ANIONGAP 12  --  11    Recent Labs  Lab 06/11/18 0918  PROT 7.1  ALBUMIN 3.3*  AST 31  ALT 22  ALKPHOS 128*  BILITOT 0.9   Hematology Recent Labs  Lab 06/11/18 0918 06/12/18 0542  WBC 11.0* 13.6*  RBC 5.31* 4.89  HGB 13.7 12.6  HCT 44.3 40.2  MCV 83.4 82.2  MCH 25.8* 25.8*  MCHC 30.9 31.3  RDW 17.2* 17.2*   PLT 149* 146*   Cardiac EnzymesNo results for input(s): TROPONINI in the last 168 hours. No results for input(s): TROPIPOC in the last 168 hours.  BNPNo results for input(s): BNP, PROBNP in the last 168 hours.  DDimer No results for input(s): DDIMER in the last 168 hours.  Radiology/Studies:  Ct Head Wo Contrast  Result Date: 06/11/2018 CLINICAL DATA:  Left-sided weakness. Left-sided facial droop. Worsened confusion. EXAM: CT HEAD WITHOUT CONTRAST TECHNIQUE: Contiguous axial images were obtained from the base of the skull through the vertex without intravenous contrast. COMPARISON:  10/05/2017 FINDINGS: Brain: The brainstem and cerebellum are unremarkable. Left cerebral hemisphere shows minimal small vessel change of the white  matter. On the right, there has been old infarction in the right basal ganglia and insula which has progressed to atrophy, encephalomalacia and gliosis. There is old appearing infarction in the right parietal lobe and temporoparietal junction, which was not visibly affected in August of last year. This does not appear acute however. There is no identifiable acute infarction. No mass lesion, hemorrhage, hydrocephalus or extra-axial collection. Vascular: There is atherosclerotic calcification of the major vessels at the base of the brain. Skull: Negative Sinuses/Orbits: Some fluid layering in the right division of the sphenoid sinus. Other sinuses are clear. Orbits are negative. 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 The area of involvement is more extensive than was seen in August of last year, with more involvement of the temporoparietal junction and posterior parietal lobe, but the findings do not look acute today. No hemorrhage or mass effect. Electronically Signed   By: Paulina Fusi M.D.   On: 06/11/2018 10:17   Ct Femur Left W Contrast  Result Date: 06/11/2018 CLINICAL DATA:  Left thigh swelling and pain.  Concern for soft tissue infection. EXAM: CT OF THE LOWER RIGHT EXTREMITY WITH CONTRAST TECHNIQUE: Multidetector CT imaging of the lower right extremity was performed according to the standard protocol following intravenous contrast administration. COMPARISON:  Left femur x-rays from same day. CONTRAST:  OMNIPAQUE IOHEXOL 300 MG/ML  SOLN FINDINGS: Bones/Joint/Cartilage No acute fracture or dislocation. No osseous destruction or periosteal reaction. Sclerosis within the left femoral head. Moderate left hip joint space narrowing. Prior left total knee arthroplasty. No evidence of hardware failure or loosening. No hip or knee joint effusion. Ligaments Suboptimally assessed by CT. Muscles and Tendons Grossly intact. Severe left gluteus medius muscle atrophy. Unchanged small lipoma in the proximal lateral gastrocnemius muscle. Soft tissues In the superficial medial aspect of the left mid thigh, there is a 3.5 x 2.0 by 1.8 cm complex, predominantly low-density lesion with thick rim enhancement. There is prominent overlying skin thickening of the medial thigh and mild surrounding fat stranding. No subcutaneous emphysema. Large stool ball in the rectum.  No lymphadenopathy. IMPRESSION: 1. 3.5 cm complex, predominantly low-density lesion with thick rim enhancement in the superficial medial aspect of the left mid thigh. Appearance is most suggestive of an abscess given overlying skin thickening and surrounding soft tissue inflammatory changes suggestive of cellulitis, however a necrotic mass could have a similar appearance. 2. No subcutaneous emphysema or deeper soft tissue infection. 3. 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 Electronically Signed   By: Obie Dredge M.D.   On: 06/11/2018 11:55   Dg Femur Portable Min 2 Views Left  Result Date: 06/11/2018 CLINICAL DATA:  Left medial thigh infection. EXAM: LEFT FEMUR PORTABLE 2 VIEWS COMPARISON:  None.  FINDINGS: One-view does not show any bone abnormality or radiopaque foreign object. IMPRESSION: No specific or significant finding on this one view examination. Electronically Signed   By: Paulina Fusi M.D.   On: 06/11/2018 09:58    Assessment and Plan:   1. Bradycardic in a fib with HR in 30s on admit, asymptomatic and on no rate lowering drugs.  May be due to acute illness monitor for now but may need PPM for high grade block.   Dr. Elease Hashimoto to see 2. Cellulitis of Lt leg, surgery following now with elevated WBC on abx--necrosis of Lt femur head seen on CT of lt upp leg 3. AKI holding  diuretic and gentle IV fluids 4. Dementia with confusion, refused EKG today 5. Permanent a fib on Eliquis on hold will need IV heparin to cover with hx of CVA and CHA2DS2VASc of 7. 6. Chronic diastolic HF - last echo in Sept with EF 65-70%  Her lasix and spironolactone on hold. 7. Hx CVA with residual weakness on Lt.  8. OSA on 2 L [place] at night.        For questions or updates, please contact [facility] Please consult [email or web address] for contact info under     Signed, Nada Boozer, NP  06/12/2018 7:27 AM    Attending Note:   The patient was seen and examined.  Agree with assessment and plan as noted above.  Changes made to the above note as needed.  Patient seen and independently examined with Nada Boozer, NP .   We discussed all aspects of the encounter. I agree with the assessment and plan as stated above.  1.    Afib with slow ventricular response:  Pt denies any symptoms.   She does not want to have anything done for her .   She especially stated that she did not want to have any procedures.   At this point she does not want anything done Fortunately, at this point, there is no indication for a pacer .   She is a poor candidate for pacer ( current absces)    I have no further recs.  Suggest comfort care + abx   [facility] will sign off.   Medication Recommendations:   Other  recommendations (labs, testing, etc):   Follow up as an outpatient:  Return to SNF once she is stable  No need to follow up with cardiology     I have spent a total of 40 minutes with patient reviewing hospital  notes , telemetry, EKGs, labs and examining patient as well as establishing an assessment and plan that was discussed with the patient. > 50% of time was spent in direct patient care.    Vesta Mixer, Montez Hageman., MD, [facility] 06/12/2018, 10:53 AM [address],  [address] Office [phone] Pager [phone]

## 2018-06-12 NOTE — Progress Notes (Addendum)
Patient is very agitated.  Patient is refusing everything- will not allow doctor  (surgeon) to assess, will not allow [place] to take vitals or give medications..  Per NT - patient ate some of breakfast but then threw her tray on the floor.   MD is aware

## 2018-06-12 NOTE — Progress Notes (Addendum)
Patient still refusing me to assess her-  Did manage to assess her abscess with Cardiologist in room.  But would not allowed to do full exam.

## 2018-06-12 NOTE — Progress Notes (Signed)
Patient has refused all meals/liquids.  Patient has urinated on bed and (per pt) had a BM-  but refused to let us change her.    Tried to talk to patient- explain risk for skin break down, but pt still refused.    NT reported pt had Nasal cannula wrapped around neck when she walked in.

## 2018-06-12 NOTE — Progress Notes (Signed)
Patient refused her BP and EKG this morning

## 2018-06-12 NOTE — Progress Notes (Addendum)
PHARMACY - PHYSICIAN COMMUNICATION CRITICAL VALUE ALERT - BLOOD CULTURE IDENTIFICATION (BCID)  Margaret Collier is an 85 y.o. female who presented to [facility] on 06/11/2018 with a chief complaint of confusion.   Assessment: Blood culture from 1 out of 4 bottles grew GPC in clusters BCID identified staph spp. (Mec A gene not detected)   Name of physician (or Provider) Contacted: Hartley Barefoot, MD  Current antibiotics:  Vancomycin 1500 mg IV q48hr Cefazolin 1g IV q8h  Changes to prescribed antibiotics recommended:  Recommended discontinuing cefazolin and continuing vancomycin given patient MRSA PCR positive, and culture most likely contaminant. MD would like to continue both antibiotics at this time since that patient had increase in WBC from 11 to 13.6 and abscess not improved.  Results for orders placed or performed during the hospital encounter of 06/11/18  Blood Culture ID Panel (Reflexed) (Collected: 06/11/2018  9:18 AM)  Result Value Ref Range   Enterococcus species NOT DETECTED NOT DETECTED   Listeria monocytogenes NOT DETECTED NOT DETECTED   Staphylococcus species DETECTED (A) NOT DETECTED   Staphylococcus aureus (BCID) NOT DETECTED NOT DETECTED   Methicillin resistance NOT DETECTED NOT DETECTED   Streptococcus species NOT DETECTED NOT DETECTED   Streptococcus agalactiae NOT DETECTED NOT DETECTED   Streptococcus pneumoniae NOT DETECTED NOT DETECTED   Streptococcus pyogenes NOT DETECTED NOT DETECTED   Acinetobacter baumannii NOT DETECTED NOT DETECTED   Enterobacteriaceae species NOT DETECTED NOT DETECTED   Enterobacter cloacae complex NOT DETECTED NOT DETECTED   Escherichia coli NOT DETECTED NOT DETECTED   Klebsiella oxytoca NOT DETECTED NOT DETECTED   Klebsiella pneumoniae NOT DETECTED NOT DETECTED   Proteus species NOT DETECTED NOT DETECTED   Serratia marcescens NOT DETECTED NOT DETECTED   Haemophilus influenzae NOT DETECTED NOT DETECTED   Neisseria meningitidis NOT  DETECTED NOT DETECTED   Pseudomonas aeruginosa NOT DETECTED NOT DETECTED   Candida albicans NOT DETECTED NOT DETECTED   Candida glabrata NOT DETECTED NOT DETECTED   Candida krusei NOT DETECTED NOT DETECTED   Candida parapsilosis NOT DETECTED NOT DETECTED   Candida tropicalis NOT DETECTED NOT DETECTED    Thank you for allowing pharmacy to be a part of this patient's care.  Lenord Carbo, PharmD PGY1 Pharmacy Resident Phone: [phone]  Please check AMION for all [facility] phone numbers 06/12/2018  1:45 PM

## 2018-06-12 NOTE — Progress Notes (Signed)
Subjective: CC: Left thigh abscess Patient very agitated. Refusing to answer questions or allow me to examine her.   Objective: Vital signs in last 24 hours: Temp:  [97.7 F (36.5 C)] 97.7 F (36.5 C) (05/07 0022) Pulse Rate:  [42-61] 47 (05/07 0520) Resp:  [12-20] 18 (05/07 0520) BP: (104-138)/(46-82) 119/60 (05/07 0520) SpO2:  [90 %-100 %] 100 % (05/07 0520) Weight:  [82.2 kg-82.3 kg] 82.2 kg (05/07 0541) Last BM Date: 06/11/18  Intake/Output from previous day: 05/06 0701 - 05/07 0700 In: 520 [P.O.:120; I.V.:300; IV Piggyback:100] Out: 0  Intake/Output this shift: Total I/O In: 240 [P.O.:240] Out: -   PE: Gen: Agitated.  Lungs: Normal effort Left thigh: Erythematous area without drainage. Unable to palpate as patient refused.   Lab Results:  Recent Labs    06/11/18 0918 06/12/18 0542  WBC 11.0* 13.6*  HGB 13.7 12.6  HCT 44.3 40.2  PLT 149* 146*   BMET Recent Labs    06/11/18 0918 06/11/18 0929 06/12/18 0542  NA 141  --  141  K 4.2  --  4.3  CL 101  --  105  CO2 28  --  25  GLUCOSE 77  --  90  BUN 46*  --  37*  CREATININE 1.15* 1.20* 1.14*  CALCIUM 9.7  --  9.0   PT/INR Recent Labs    06/11/18 0918 06/12/18 0542  LABPROT 23.2* 23.9*  INR 2.1* 2.2*   CMP     Component Value Date/Time   NA 141 06/12/2018 0542   K 4.3 06/12/2018 0542   CL 105 06/12/2018 0542   CO2 25 06/12/2018 0542   GLUCOSE 90 06/12/2018 0542   BUN 37 (H) 06/12/2018 0542   CREATININE 1.14 (H) 06/12/2018 0542   CALCIUM 9.0 06/12/2018 0542   PROT 7.1 06/11/2018 0918   ALBUMIN 3.3 (L) 06/11/2018 0918   AST 31 06/11/2018 0918   ALT 22 06/11/2018 0918   ALKPHOS 128 (H) 06/11/2018 0918   BILITOT 0.9 06/11/2018 0918   GFRNONAA 44 (L) 06/12/2018 0542   GFRAA 51 (L) 06/12/2018 0542   Lipase  No results found for: LIPASE     Studies/Results: Ct Head Wo Contrast  Result Date: 06/11/2018 CLINICAL DATA:  Left-sided weakness. Left-sided facial droop. Worsened  confusion. EXAM: CT HEAD WITHOUT CONTRAST TECHNIQUE: Contiguous axial images were obtained from the base of the skull through the vertex without intravenous contrast. COMPARISON:  10/05/2017 FINDINGS: Brain: The brainstem and cerebellum are unremarkable. Left cerebral hemisphere shows minimal small vessel change of the white matter. On the right, there has been old infarction in the right basal ganglia and insula which has progressed to atrophy, encephalomalacia and gliosis. There is old appearing infarction in the right parietal lobe and temporoparietal junction, which was not visibly affected in August of last year. This does not appear acute however. There is no identifiable acute infarction. No mass lesion, hemorrhage, hydrocephalus or extra-axial collection. Vascular: There is atherosclerotic calcification of the major vessels at the base of the brain. Skull: Negative Sinuses/Orbits: Some fluid layering in the right division of the sphenoid sinus. Other sinuses are clear. Orbits are negative. 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 The area of involvement is more extensive than was seen in August of last year, with more involvement of the temporoparietal junction and posterior parietal lobe, but the findings do not look acute today. No hemorrhage or mass effect.  Electronically Signed   By: Paulina Fusi M.D.   On: 06/11/2018 10:17   Ct Femur Left W Contrast  Result Date: 06/11/2018 CLINICAL DATA:  Left thigh swelling and pain. Concern for soft tissue infection. EXAM: CT OF THE LOWER RIGHT EXTREMITY WITH CONTRAST TECHNIQUE: Multidetector CT imaging of the lower right extremity was performed according to the standard protocol following intravenous contrast administration. COMPARISON:  Left femur x-rays from same day. CONTRAST:  OMNIPAQUE IOHEXOL 300 MG/ML  SOLN FINDINGS: Bones/Joint/Cartilage No acute fracture or dislocation. No osseous  destruction or periosteal reaction. Sclerosis within the left femoral head. Moderate left hip joint space narrowing. Prior left total knee arthroplasty. No evidence of hardware failure or loosening. No hip or knee joint effusion. Ligaments Suboptimally assessed by CT. Muscles and Tendons Grossly intact. Severe left gluteus medius muscle atrophy. Unchanged small lipoma in the proximal lateral gastrocnemius muscle. Soft tissues In the superficial medial aspect of the left mid thigh, there is a 3.5 x 2.0 by 1.8 cm complex, predominantly low-density lesion with thick rim enhancement. There is prominent overlying skin thickening of the medial thigh and mild surrounding fat stranding. No subcutaneous emphysema. Large stool ball in the rectum.  No lymphadenopathy. IMPRESSION: 1. 3.5 cm complex, predominantly low-density lesion with thick rim enhancement in the superficial medial aspect of the left mid thigh. Appearance is most suggestive of an abscess given overlying skin thickening and surrounding soft tissue inflammatory changes suggestive of cellulitis, however a necrotic mass could have a similar appearance. 2. No subcutaneous emphysema or deeper soft tissue infection. 3. 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 Electronically Signed   By: Obie Dredge M.D.   On: 06/11/2018 11:55   Dg Femur Portable Min 2 Views Left  Result Date: 06/11/2018 CLINICAL DATA:  Left medial thigh infection. EXAM: LEFT FEMUR PORTABLE 2 VIEWS COMPARISON:  None. FINDINGS: One-view does not show any bone abnormality or radiopaque foreign object. IMPRESSION: No specific or significant finding on this one view examination. Electronically Signed   By: Paulina Fusi M.D.   On: 06/11/2018 09:58    Anti-infectives: Anti-infectives (From admission, onward)   Start     Dose/Rate Route Frequency Ordered Stop   06/13/18 1200  vancomycin (VANCOCIN) 1,500 mg in sodium chloride 0.9 % 500 mL  IVPB     1,500 mg 250 mL/hr over 120 Minutes Intravenous Every 48 hours 06/11/18 1233     06/11/18 2200  ceFAZolin (ANCEF) IVPB 1 g/50 mL premix     1 g 100 mL/hr over 30 Minutes Intravenous Every 8 hours 06/11/18 1335     06/11/18 1300  vancomycin (VANCOCIN) 1,750 mg in sodium chloride 0.9 % 500 mL IVPB     1,750 mg 250 mL/hr over 120 Minutes Intravenous  Once 06/11/18 1233 06/11/18 1512   06/11/18 1215  ceFAZolin (ANCEF) IVPB 1 g/50 mL premix     1 g 100 mL/hr over 30 Minutes Intravenous  Once 06/11/18 1212 06/11/18 1300   06/11/18 1215  vancomycin (VANCOCIN) IVPB 1000 mg/200 mL premix  Status:  Discontinued     1,000 mg 200 mL/hr over 60 Minutes Intravenous  Once 06/11/18 1212 06/11/18 1233       Assessment/Plan A fib on eliquis H/o CVA with residual left sided weakness HLD CHF OSA on 2L O2 [place] at night Bedbound  Left thigh abscess - Patient with a 3.5 cm abscess mid left medial thigh with diffuse cellulitis.  -Will plan  for I&D in OR after Eliquis has worn off. She is currently refusing any care. Will discuss with patients POA - Continue broad spectrum antibiotics - Will continue to follow.  ID - ancef/vancomycin 5/6>> VTE - SCDs, ok for lovenox, sq heparin, or IV heparin (whichever is needed) from surgical standpoint but please hold eliquis FEN - HH diet Foley - none Follow up - TBD   LOS: 0 days    Jacinto HalimMichael M Saketh Daubert , [facility] 06/12/2018, 10:21 AM Pager: [phone]

## 2018-06-12 NOTE — Progress Notes (Signed)
ANTICOAGULATION CONSULT NOTE - Follow-Up Consult  Pharmacy Consult for heparin Indication: atrial fibrillation  Allergies  Allergen Reactions  . Zanaflex [Tizanidine Hcl] Shortness Of Breath, Other (See Comments) and Cough    Dehydration - reported by [facility] 10/07/13  . Alprazolam Other (See Comments)    Dry mouth - reported by [facility] 05/13/14   . Carisoprodol Other (See Comments)    Dizziness - reported by [facility] 05/13/14   . Chlorthalidone Other (See Comments)    syncope  . Colesevelam Other (See Comments)    GI upset and stool change - reported by [facility] 05/13/14   . Dilaudid [Hydromorphone Hcl] Hives and Swelling  . Duloxetine Other (See Comments)    Dizziness - reported by [facility] 05/13/14   . Felodipine Other (See Comments)    Dizziness, groin pain - reported by [facility] 05/13/14   . Fosamax [Alendronate Sodium] Nausea And Vomiting and Other (See Comments)    Back and hip pain  . Furosemide Other (See Comments)    Dry mouth/dizziness - reported by [facility] 05/13/14   . Gabapentin Other (See Comments)    Insomnia - reported by [facility] 05/13/14   . Ibuprofen Other (See Comments)    GI pain - reported by [facility] 05/13/14   . Lovastatin Other (See Comments)    Weakness - reported by [facility] 05/13/14   . Meclizine Other (See Comments)    Sleeplessness - reported by [facility] 05/13/14   . Meperidine Other (See Comments)    Jitteriness - reported by [facility] 05/13/14   . Methadone Other (See Comments)    Stool impaction - reported by [facility] 05/13/14   . Morphine Other (See Comments)    Dizziness - reported by [facility] 05/13/14   . Niacin Other (See Comments)    Flushing - reported by [facility] 05/13/14   . Omeprazole Other (See Comments)    Sleeplessness - reported by [facility] 05/13/14   . Oxycodone Other (See Comments)    Groin pain - reported by [facility]  05/13/14   . Pravastatin Other (See Comments)    Weakness - reported by [facility] 05/13/14   . Simvastatin Other (See Comments)    Weakness - reported by [facility] 05/13/14   . Statins Other (See Comments)    Myalgia, weakness - reported by [facility] 05/13/14  . Tramadol Other (See Comments)    Nervous - reported by [facility] 05/13/14   . Valsartan Other (See Comments)    Dizziness - reported by [facility] 05/13/14   . Hydrocodone-Acetaminophen Nausea And Vomiting    reported by [facility] 05/13/14     Patient Measurements: Height: 5' (152.4 cm) Weight: 181 lb 3.5 oz (82.2 kg) IBW/kg (Calculated) : 45.5  Vital Signs: BP: 130/117 (05/07 1926) Pulse Rate: 63 (05/07 1926)  Labs: Recent Labs    06/11/18 0918 06/11/18 0929 06/12/18 0542 06/12/18 2137  HGB 13.7  --  12.6  --   HCT 44.3  --  40.2  --   PLT 149*  --  146*  --   APTT 63*  --  63* 114*  LABPROT 23.2*  --  23.9*  --   INR 2.1*  --  2.2*  --   CREATININE 1.15* 1.20* 1.14*  --  Estimated Creatinine Clearance: 34.9 mL/min (A) (by C-G formula based on SCr of 1.14 mg/dL (H)).  Assessment: 86 yoF left sided weakness, facial droop, slurred speech on apixaban PTA for AFib switched to heparin for procedures. Initial aPTT is elevated at 114 seconds despite being started just 3 hr ago due to pt being combative and refusing care.   Goal of Therapy:  Heparin level 0.3-0.7 units/ml aPTT 66 - 102 seconds Monitor platelets by anticoagulation protocol: Yes   Plan:  -Reduce heparin to 700 units/hr -Recheck aPTT and heparin level in 8hr   Fredonia HighlandMichael Thos Matsumoto, PharmD, BCPS Clinical Pharmacist [phone] Please check AMION for all [facility] numbers 06/12/2018

## 2018-06-12 NOTE — Progress Notes (Addendum)
PROGRESS NOTE    Margaret Collier  ZOX:[id number] DOB: Jul 13, 1931 DOA: 06/11/2018 PCP: System, Pcp Not In    Brief Narrative 85 year old with past medical history significant for hypertension, hyperlipidemia, chronic A. fib on Eliquis oxygen dependent at night 2 L, CVA with residual left-sided weakness, OSA who presents complaining with worsening confusion and a speech problem.  History has been limited to get from the patient due to history of dementia and report of increased; confusion. Patient is bedbound at baseline, at facility they use a Hoyer lift to get her up in the chair. Evaluation and admission: A CT head showed areas of previous stroke.  CT scan left femur showed 3.5 cm abscess left mid thigh. Patient also was noted to be bradycardic with heart rate in the 40s.   Assessment & Plan:   Principal Problem:   Cellulitis and abscess of left leg Active Problems:   Atrial fibrillation (HCC)   OSA (obstructive sleep apnea)   History of CVA with residual deficit   Bradycardia   AKI (acute kidney injury) (HCC)   Thrombocytopenia (HCC)  1-Left thigh abscess; cellulitis Patient present with confusion, leukocytosis, worsening redness of left lower extremity.  She failed oral antibiotic. -CT showed focal area of abscess. -Surgery has been consulted and is recommending IV antibiotics for 48 hours and patient might require I and D. -Patient refusing care, will consult psych for capacity.  Surgery awaiting for Eliquis to worn off. -White count worse today at 13, still with significant redness, will continue with IV antibiotics.    Blood culture positive 1--4;  Staph, pending results. Not aureus.  On IV vancomycin.  2-Acute metabolic encephalopathy; He was transferred from her facility with increased confusion and questionable worsening left side weakness. CT head was negative. She Is alert and oriented x3.  She has been refusing care.  Psychiatric consulted for evaluation for  capacity  3-Bradycardia, asymptomatic GEN no current indication for pacer at this time.  Also patient currently with an infection.  Candidate for pacer.  4-permanent A. fib on Eliquis: Eliquis on hold due to possible surgical procedure. Cardiology  recommending heparin, will order heparin per pharmacy.  5-history of dementia 6-mild chronic thrombocytopenia  7-AKI: Prior creatinine around 0.8.  Suspect prerenal.  Continue with IV fluids.  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  9-Constipation;  MiraLAX. 10-chronic diastolic heart failure: Holding Lasix due to AKI and infection. 11-history of CVA with residual left-sided weakness, neuropathy: Repeated CT head negative. 12-oh ASA: Continue with 2 L of oxygen at night.  Estimated body mass index is 35.39 kg/m as calculated from the following:   Height as of this encounter: 5' (1.524 m).   Weight as of this encounter: 82.2 kg.   DVT prophylaxis: Was on Eliquis prior to admission, currently on hold due to possible surgical procedure Code Status: Full code Family Communication; will update family. Niece updated.  Disposition Plan: Patient needs to remain in the hospital for IV antibiotics, she failed oral antibiotic for left thigh abscess.  Left lower extremity leg with significant edema and redness, worsening white count today.  She might require surgical intervention.  Will need to get  psychiatric to evaluate patient for capacity.  Consultants:   Cardiology  Surgery    Procedures:   none   Antimicrobials:   Ancef 5-06  Vancomycin 5-06   Subjective: Patient has been agitated this morning, refusing vitals check. She is alert and she  is able to answer questions. She is complaining of left thigh pain.  Initially  she did not allow me to examine her but subsequently she allows me.  Complaining of significant pain in her left thigh on palpation.  I explained  to  her, that if she does not respond to the IV antibiotics he might require surgery.  She said that she would not want surgery.  But she is also not okay with dying from an infection on her left leg.  She was alert to place time year.  Per nurse patient has been confused earlier, thought that she was at the lobby in the hospital.   Objective: Vitals:   06/12/18 0325 06/12/18 0511 06/12/18 0520 06/12/18 0541  BP: 138/74 (!) 120/52 119/60   Pulse: (!) 51 (!) 48 (!) 47   Resp:   18   Temp:      TempSrc:      SpO2: 100% 100% 100%   Weight:    82.2 kg  Height:        Intake/Output Summary (Last 24 hours) at 06/12/2018 0727 Last data filed at 06/12/2018 0200 Gross per 24 hour  Intake 520 ml  Output 0 ml  Net 520 ml   Filed Weights   06/11/18 0912 06/11/18 1636 06/12/18 0541  Weight: 83.9 kg 82.3 kg 82.2 kg    Examination:  General exam: Agitated at times Respiratory system: Clear to auscultation. Respiratory effort normal. Cardiovascular system: S1 & S2 heard, distant heart sounds Gastrointestinal system: Abdomen is nondistended, soft and nontender. No organomegaly or masses felt. Normal bowel sounds heard. Central nervous system: Chronic left-sided weakness, left arm contraction, left facial weakness. Extremities: edema left leg  Skin: Significant redness at left thigh, mid area, tender to palpation    Data Reviewed: I have personally reviewed following labs and imaging studies  CBC: Recent Labs  Lab 06/11/18 0918 06/12/18 0542  WBC 11.0* 13.6*  NEUTROABS 7.8*  --   HGB 13.7 12.6  HCT 44.3 40.2  MCV 83.4 82.2  PLT 149* 146*   Basic Metabolic Panel: Recent Labs  Lab 06/11/18 0918 06/11/18 0929 06/12/18 0542  NA 141  --  141  K 4.2  --  4.3  CL 101  --  105  CO2 28  --  25  GLUCOSE 77  --  90  BUN 46*  --  37*  CREATININE 1.15* 1.20* 1.14*  CALCIUM 9.7  --  9.0   GFR: Estimated Creatinine Clearance: 34.9 mL/min (A) (by C-G formula based on SCr of 1.14 mg/dL  (H)). Liver Function Tests: Recent Labs  Lab 06/11/18 0918  AST 31  ALT 22  ALKPHOS 128*  BILITOT 0.9  PROT 7.1  ALBUMIN 3.3*   No results for input(s): LIPASE, AMYLASE in the last 168 hours. No results for input(s): AMMONIA in the last 168 hours. Coagulation Profile: Recent Labs  Lab 06/11/18 0918 06/12/18 0542  INR 2.1* 2.2*   Cardiac Enzymes: No results for input(s): CKTOTAL, CKMB, CKMBINDEX, TROPONINI in the last 168 hours. BNP (last 3 results) No results for input(s): PROBNP in the last 8760 hours. HbA1C: No results for input(s): HGBA1C in the last 72 hours. CBG: No results for input(s): GLUCAP in the last 168 hours. Lipid Profile: No results for input(s): CHOL, HDL, LDLCALC, TRIG, CHOLHDL, LDLDIRECT in the last 72 hours. Thyroid Function Tests: No results for input(s): TSH, T4TOTAL, FREET4, T3FREE, THYROIDAB in the last 72 hours. Anemia Panel: No results for input(s): VITAMINB12,  FOLATE, FERRITIN, TIBC, IRON, RETICCTPCT in the last 72 hours. Sepsis Labs: Recent Labs  Lab 06/11/18 1610 06/11/18 1113  LATICACIDVEN 0.9 1.0    Recent Results (from the past 240 hour(s))  Culture, blood (routine x 2)     Status: None (Preliminary result)   Collection Time: 06/11/18  9:18 AM  Result Value Ref Range Status   Specimen Description BLOOD RIGHT ANTECUBITAL  Final   Special Requests   Final    BOTTLES DRAWN AEROBIC AND ANAEROBIC Blood Culture adequate volume   Culture   Final    NO GROWTH < 24 HOURS Performed at [facility], [address], [place], [place] [address]    Report Status PENDING  Incomplete  Culture, blood (routine x 2)     Status: None (Preliminary result)   Collection Time: 06/11/18  9:18 AM  Result Value Ref Range Status   Specimen Description BLOOD BLOOD RIGHT FOREARM  Final   Special Requests   Final    BOTTLES DRAWN AEROBIC AND ANAEROBIC Blood Culture adequate volume   Culture   Final    NO GROWTH < 24 HOURS Performed at [facility], [address], [place], [place] [address]    Report Status PENDING  Incomplete  SARS Coronavirus 2 (CEPHEID - Performed in [facility] hospital lab), Hosp Order     Status: None   Collection Time: 06/11/18  9:18 AM  Result Value Ref Range Status   SARS Coronavirus 2 NEGATIVE NEGATIVE Final    Comment: (NOTE) If result is NEGATIVE SARS-CoV-2 target nucleic acids are NOT DETECTED. The SARS-CoV-2 RNA is generally detectable in upper and lower  respiratory specimens during the acute phase of infection. The lowest  concentration of SARS-CoV-2 viral copies this assay can detect is 250  copies / mL. A negative result does not preclude SARS-CoV-2 infection  and should not be used as the sole basis for treatment or other  patient management decisions.  A negative result may occur with  improper specimen collection / handling, submission of specimen other  than nasopharyngeal swab, presence of viral mutation(s) within the  areas targeted by this assay, and inadequate number of viral copies  (<250 copies / mL). A negative result must be combined with clinical  observations, patient history, and epidemiological information. If result is POSITIVE SARS-CoV-2 target nucleic acids are DETECTED. The SARS-CoV-2 RNA is generally detectable in upper and lower  respiratory specimens dur ing the acute phase of infection.  Positive  results are indicative of active infection with SARS-CoV-2.  Clinical  correlation with patient history and other diagnostic information is  necessary to determine patient infection status.  Positive results do  not rule out bacterial infection or co-infection with other viruses. If result is PRESUMPTIVE POSTIVE SARS-CoV-2 nucleic acids MAY BE PRESENT.   A presumptive positive result was obtained on the submitted specimen  and confirmed on repeat testing.  While 2019 novel coronavirus  (SARS-CoV-2) nucleic acids may be present in the submitted sample  additional  confirmatory testing may be necessary for epidemiological  and / or clinical management purposes  to differentiate between  SARS-CoV-2 and other Sarbecovirus currently known to infect humans.  If clinically indicated additional testing with an alternate test  methodology [phone]) is advised. The SARS-CoV-2 RNA is generally  detectable in upper and lower respiratory sp ecimens during the acute  phase of infection. The expected result is Negative. Fact Sheet for Patients:  BoilerBrush.com.cy Fact Sheet for Healthcare  Providers: https://pope.com/ This test is not yet approved or cleared by the [place] and has been authorized for detection and/or diagnosis of SARS-CoV-2 by FDA under an Emergency Use Authorization (EUA).  This EUA will remain in effect (meaning this test can be used) for the duration of the COVID-19 declaration under Section 564(b)(1) of the Act, 21 U.S.C. section 360bbb-3(b)(1), unless the authorization is terminated or revoked sooner. Performed at [facility], [address] [place], [place] [address]   MRSA PCR Screening     Status: Abnormal   Collection Time: 06/11/18 11:55 PM  Result Value Ref Range Status   MRSA by PCR POSITIVE (A) NEGATIVE Final    Comment:        The GeneXpert MRSA Assay (FDA approved for NASAL specimens only), is one component of a comprehensive MRSA colonization surveillance program. It is not intended to diagnose MRSA infection nor to guide or monitor treatment for MRSA infections. RESULT CALLED TO, READ BACK BY AND VERIFIED WITH: L. Sheliah Plane [id number] 06/12/2018 Girtha Hake Performed at [facility], [address], [place], [place] [address]          Radiology Studies: Ct Head Wo Contrast  Result Date: 06/11/2018 CLINICAL DATA:  Left-sided weakness. Left-sided facial droop. Worsened confusion. EXAM: CT HEAD WITHOUT CONTRAST TECHNIQUE: Contiguous axial images were  obtained from the base of the skull through the vertex without intravenous contrast. COMPARISON:  10/05/2017 FINDINGS: Brain: The brainstem and cerebellum are unremarkable. Left cerebral hemisphere shows minimal small vessel change of the white matter. On the right, there has been old infarction in the right basal ganglia and insula which has progressed to atrophy, encephalomalacia and gliosis. There is old appearing infarction in the right parietal lobe and temporoparietal junction, which was not visibly affected in August of last year. This does not appear acute however. There is no identifiable acute infarction. No mass lesion, hemorrhage, hydrocephalus or extra-axial collection. Vascular: There is atherosclerotic calcification of the major vessels at the base of the brain. Skull: Negative Sinuses/Orbits: Some fluid layering in the right division of the sphenoid sinus. Other sinuses are clear. Orbits are negative. 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 The area of involvement is more extensive than was seen in August of last year, with more involvement of the temporoparietal junction and posterior parietal lobe, but the findings do not look acute today. No hemorrhage or mass effect. Electronically Signed   By: Paulina Fusi M.D.   On: 06/11/2018 10:17   Ct Femur Left W Contrast  Result Date: 06/11/2018 CLINICAL DATA:  Left thigh swelling and pain. Concern for soft tissue infection. EXAM: CT OF THE LOWER RIGHT EXTREMITY WITH CONTRAST TECHNIQUE: Multidetector CT imaging of the lower right extremity was performed according to the standard protocol following intravenous contrast administration. COMPARISON:  Left femur x-rays from same day. CONTRAST:  OMNIPAQUE IOHEXOL 300 MG/ML  SOLN FINDINGS: Bones/Joint/Cartilage No acute fracture or dislocation. No osseous destruction or periosteal reaction. Sclerosis within the left femoral head.  Moderate left hip joint space narrowing. Prior left total knee arthroplasty. No evidence of hardware failure or loosening. No hip or knee joint effusion. Ligaments Suboptimally assessed by CT. Muscles and Tendons Grossly intact. Severe left gluteus medius muscle atrophy. Unchanged small lipoma in the proximal lateral gastrocnemius muscle. Soft tissues In the superficial medial aspect of the left mid thigh, there is a 3.5 x 2.0 by 1.8 cm  complex, predominantly low-density lesion with thick rim enhancement. There is prominent overlying skin thickening of the medial thigh and mild surrounding fat stranding. No subcutaneous emphysema. Large stool ball in the rectum.  No lymphadenopathy. IMPRESSION: 1. 3.5 cm complex, predominantly low-density lesion with thick rim enhancement in the superficial medial aspect of the left mid thigh. Appearance is most suggestive of an abscess given overlying skin thickening and surrounding soft tissue inflammatory changes suggestive of cellulitis, however a necrotic mass could have a similar appearance. 2. No subcutaneous emphysema or deeper soft tissue infection. 3. 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 Electronically Signed   By: Obie DredgeWilliam T Derry M.D.   On: 06/11/2018 11:55   Dg Femur Portable Min 2 Views Left  Result Date: 06/11/2018 CLINICAL DATA:  Left medial thigh infection. EXAM: LEFT FEMUR PORTABLE 2 VIEWS COMPARISON:  None. FINDINGS: One-view does not show any bone abnormality or radiopaque foreign object. IMPRESSION: No specific or significant finding on this one view examination. Electronically Signed   By: Paulina FusiMark  Shogry M.D.   On: 06/11/2018 09:58        Scheduled Meds: . atorvastatin  40 mg Oral q1800  . calcium carbonate  1 tablet Oral TID AC  . chlorhexidine  15 mL Mouth/Throat BID  . pantoprazole  40 mg Oral Q0600  . pregabalin  25 mg Oral Q8H  . sodium chloride flush  3 mL Intravenous Q12H    Continuous Infusions: . sodium chloride 50 mL/hr at 06/11/18 2205  .  ceFAZolin (ANCEF) IV 1 g (06/12/18 0322)  . [START ON 06/13/2018] vancomycin       LOS: 0 days    Time spent: 35 minutes    Alba CoryBelkys A Aydrien Froman, MD [facility] Pager [phone]  If 7PM-7AM, please contact night-coverage [email or web address] Password [email or web address] 06/12/2018, 7:27 AM

## 2018-06-12 NOTE — Progress Notes (Signed)
ANTICOAGULATION CONSULT NOTE - Initial Consult  Pharmacy Consult for heparin Indication: atrial fibrillation  Allergies  Allergen Reactions  . Zanaflex [Tizanidine Hcl] Shortness Of Breath, Other (See Comments) and Cough    De[facility] - [facility] 10/07/13  . Alprazolam Other (See Comments)    Dry mouth - [facility] 05/13/14   . Carisoprodol Other (See Comments)    Dizziness - [facility] 05/13/[facility]   . Chlorthalidone Other (See Comments)    syncope  . Colesevelam Other (See Comments)    GI upset and stool change - reported by [facility] 05/13/14   . Dilaudid [Hydromorphone Hcl] Hives and Swelling  . Duloxetine Other (See Comments)    Dizziness - reported [facility] 05/13/14   . Felodipine Other (See Comments)    Dizziness, groin pain - reported by [facility] 05/13/14   . Fosamax [Alendronate Sodium] Nausea And Vomiting and Other (See Comments)    Back and hip pain  . Furosemide Other (See Comments)    Dry mouth/dizziness - reported by [facility] 05/13/14   . Gabapentin Other (See Comments)    Insomnia - [facility] 05/13/14   . Ibuprofen Other (See Comments)    GI pain - [facility] 05/13/14   . Lovastatin Other (See Comments)    Weakness - reported [facility] 05/13/14   . Meclizine Other (See Comments)    Sleeplessness - reported by [facility] 05/13/14   . Meperidine Other (See Comments)    Jitteriness - reported by [facility] 05/13/14   . Methadone Other (See Comments)    Stool impaction - reported by [facility] 05/13/14   . Morphine Other (See Comments)    Dizziness - reported by [facility] 05/13/14   . Niacin Other (See Comments)    Flushing - reported by [facility] 05/13/14   . Omeprazole Other (See Comments)    Sleeplessness - reported by [facility] 05/13/14   . Oxycodone Other (See Comments)    Groin pain - reported [facility]  05/13/14   . Pravastatin Other (See Comments)    Weakness - reported by [facility] 05/13/14   . Simvastatin Other (See Comments)    Weakness - reported [facility] 05/13/14   . Statins Other (See Comments)    Myalgia, weakness - reported [facility] 05/13/14  . Tramadol Other (See Comments)    Nervous - reported by [facility] 05/13/14   . Valsartan Other (See Comments)    Dizziness - reported [facility] 05/13/14   . Hydrocodone-Acetaminophen Nausea And Vomiting    reported by [facility] 05/13/14     Patient Measurements: Height: 5' (152.4 cm) Weight: 181 lb 3.5 oz (82.2 kg) IBW/kg (Calculated) : 45.5  Vital Signs: BP: 119/60 (05/07 0520) Pulse Rate: 47 (05/07 0520)  Labs: Recent Labs    06/11/18 0918 06/11/18 0929 06/12/18 0542  HGB 13.7  --  12.6  HCT 44.3  --  40.2  PLT 149*  --  146*  APTT 63*  --  63*  LABPROT 23.2*  --  23.9*  INR 2.1*  --  2.2*  CREATININE 1.15* 1.20* 1.14*    Estimated Creatinine Clearance: 34.9 mL/min (A) (by C-G formula based on SCr of 1.14 mg/dL (H)).  Assessment: CC/HPI:  left sided weakness, facial droop, slurred speech  PMH:  memory problems, atrial fibrillation, hypertension, hyperlipidemia, OSA, chronic oxygen use (2L), CHF, CVA with chronic left-sided weakness   Anticoag: eliquis PTA; last dose 5/5 Heparin 5/7>>  Renal: Scr 1.14   Heme/Onc: H&H 12.6/40.2, Plt 146  Goal of Therapy:  Heparin level 0.3-0.7 units/ml aPTT 66 - 102 seconds Monitor platelets by anticoagulation protocol: Yes   Plan:  Heparin 1000 units/hr Initial aPTT 2200 Daily HL aPTT  Isaac Bliss, PharmD, BCPS, BCCCP Clinical Pharmacist [phone]  Please check AMION for all [facility] numbers  06/12/2018 12:42 PM

## 2018-06-12 NOTE — Progress Notes (Signed)
Called the POA [name])  - gave an update on Mrs. Kurek. Mrs. Roger Shelter has talked with family- they are ok to do the procedure, but does want to be called prior to doing anything.

## 2018-06-12 NOTE — Progress Notes (Signed)
I spoke at lengths with patients niece, [name], who is her POA. I spoke with her that the patient has a 3.5cm abscess on her left thigh with surrounding cellulitis that is currently being treated with broad spectrum antibiotics. We discussed that the patient was confused this morning and refusing care. She states that she is normally sharp but at times this can be normal for her to be like this, and she will fluctuate day by day. We discussed taking the patient to the OR for I&D, including the risks and benefits of this. We discussed that her Eliquis is currently being held and she is now on heparin. We discussed that she has had positive blood cultures. After consideration, Ms. Roger Shelter would like to hold off on surgery for now, and would like to treat with antibiotics to see how the patient does. She has concerns given the patients risk factors with general anesthesia and would like to talk to other family members to get their input before making a decision. Given the circumstances, I feel this is appropriate and we will continue to follow.

## 2018-06-12 NOTE — Progress Notes (Addendum)
MD returned paged-  Patient has been deemed incompetent -  Changed bedding, started Heparin IV, replaced purewick and cardiac monitor leads.   Pt still has mit and IV is wrapped in kerlex.   Patient did argue and fuss during the procedure but did not fight [place].

## 2018-06-13 ENCOUNTER — Encounter (HOSPITAL_COMMUNITY): Payer: Self-pay | Admitting: Anesthesiology

## 2018-06-13 LAB — BASIC METABOLIC PANEL
Anion gap: 16 — ABNORMAL HIGH (ref 5–15)
BUN: 27 mg/dL — ABNORMAL HIGH (ref 8–23)
CO2: 22 mmol/L (ref 22–32)
Calcium: 9.2 mg/dL (ref 8.9–10.3)
Chloride: 107 mmol/L (ref 98–111)
Creatinine, Ser: 0.93 mg/dL (ref 0.44–1.00)
GFR calc Af Amer: >60 mL/min (ref >60)
GFR calc non Af Amer: 56 mL/min — ABNORMAL LOW (ref >60)
Glucose, Bld: 81 mg/dL (ref 70–99)
Potassium: 4 mmol/L (ref 3.5–5.1)
Sodium: 145 mmol/L (ref 135–145)

## 2018-06-13 LAB — CBC
HCT: 41.5 % (ref 36.0–46.0)
Hemoglobin: 13.1 g/dL (ref 12.0–15.0)
MCH: 25.8 pg — ABNORMAL LOW (ref 26.0–34.0)
MCHC: 31.6 g/dL (ref 30.0–36.0)
MCV: 81.9 fL (ref 80.0–100.0)
Platelets: 149 10*3/uL — ABNORMAL LOW (ref 150–400)
RBC: 5.07 MIL/uL (ref 3.87–5.11)
RDW: 17.2 % — ABNORMAL HIGH (ref 11.5–15.5)
WBC: 8.4 10*3/uL (ref 4.0–10.5)
nRBC: 0 % (ref 0.0–0.2)

## 2018-06-13 LAB — HEPARIN LEVEL (UNFRACTIONATED): Heparin Unfractionated: >2.2 IU/mL — ABNORMAL HIGH (ref 0.30–0.70)

## 2018-06-13 LAB — APTT
aPTT: 170 seconds (ref 24–36)
aPTT: 63 seconds — ABNORMAL HIGH (ref 24–36)

## 2018-06-13 MED ORDER — MUPIROCIN 2 % EX OINT
1.0000 "application " | TOPICAL_OINTMENT | Freq: Two times a day (BID) | CUTANEOUS | Status: AC
  Administered 2018-06-13 – 2018-06-17 (×9): 1 via NASAL
  Filled 2018-06-13 (×3): qty 22

## 2018-06-13 MED ORDER — POLYETHYLENE GLYCOL 3350 17 G PO PACK
17.0000 g | PACK | Freq: Every day | ORAL | Status: DC
  Administered 2018-06-13: 17 g via ORAL
  Filled 2018-06-13 (×2): qty 1

## 2018-06-13 MED ORDER — HEPARIN (PORCINE) 25000 UT/250ML-% IV SOLN
500.0000 [IU]/h | INTRAVENOUS | Status: AC
  Administered 2018-06-13: 450 [IU]/h via INTRAVENOUS

## 2018-06-13 MED ORDER — SODIUM CHLORIDE 0.9 % IV SOLN
INTRAVENOUS | Status: DC | PRN
  Administered 2018-06-13 – 2018-06-14 (×2): 250 mL via INTRAVENOUS

## 2018-06-13 MED ORDER — SENNOSIDES-DOCUSATE SODIUM 8.6-50 MG PO TABS
1.0000 | ORAL_TABLET | Freq: Two times a day (BID) | ORAL | Status: DC
  Administered 2018-06-14 (×2): 1 via ORAL
  Filled 2018-06-13 (×3): qty 1

## 2018-06-13 MED ORDER — CHLORHEXIDINE GLUCONATE CLOTH 2 % EX PADS
6.0000 | MEDICATED_PAD | Freq: Every day | CUTANEOUS | Status: AC
  Administered 2018-06-14 – 2018-06-17 (×4): 6 via TOPICAL

## 2018-06-13 NOTE — Progress Notes (Addendum)
Initial Nutrition Assessment  RD working remotely.  DOCUMENTATION CODES:   Obesity unspecified  INTERVENTION:   -Ensure Enlive po TID, each supplement provides 350 kcal and 20 grams of protein -MVI with minerals daily  NUTRITION DIAGNOSIS:   Increased nutrient needs related to wound healing, post-op healing as evidenced by estimated needs.  GOAL:   Patient will meet greater than or equal to 90% of their needs  MONITOR:   PO intake, Supplement acceptance, Labs, Weight trends, Skin, I & O's  REASON FOR ASSESSMENT:   Low Braden    ASSESSMENT:   HPI: Margaret Collier is a 85 y.o. female with medical history significant of HTN, HLD, chronic A. 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  History is limited from the patient due to history of dementia and reports of increased confusion.  Patient reports that she is unaware why she is here.  However, notes that she was on antibiotics at [facility] prior to coming into the hospital for an infection in her leg.  Complains of pain in her back and increased sensitive on the left sided that is not new.  Discussed case with the patient's niece over the phone who notes that they had started her on antibiotics for an infection in her legs and reports of a cut to her right leg 2 days ago.  At baseline she is reportedly bedbound and the facility uses a Hoyer lift to get her up to chair daily.  Patient denies any other complaints at this time.  Pt admitted with lt thigh abscess with cellulitis.   Per general surgery notes, recommending 48 hours of IV antibiotics and possible I&D. Pt refused surgery, however, psychiatry consult reports pt lacks the capacity to consent to surgery and refuse current medical care. Plan surgery tomorrow (06/13/18).   Reviewed I/O's: -440 ml x 24 hours and +80 ml since admission  UOP:  850 ml x 24 hours  Per chart review, pt has been refusing care, meals, and medications at times.   Spoke with RN, who answered pt room phone. She reports that pt is taking taking meds crushed in applesauce for her without difficulty. Per RN, pt is not eating- RN asked pt why she was not eating and she replied "I'm not hungry". RN did not observe any chewing or swallowing issues for this pt; pt just consumed a can of gingerale. RN thinks that pt will drink supplements such as Ensure if this is offered to her.   Reviewed wt hx; wt has been stable over the past 4 months.   Due to variable oral intake and increased nutritional needs, pt would benefit from addition of oral nutrition supplements.   Labs reviewed.   NUTRITION - FOCUSED PHYSICAL EXAM:    Most Recent Value  Orbital Region  Unable to assess  Upper Arm Region  Unable to assess  Thoracic and Lumbar Region  Unable to assess  Buccal Region  Unable to assess  Temple Region  Unable to assess  Clavicle Bone Region  Unable to assess  Clavicle and Acromion Bone Region  Unable to assess  Scapular Bone Region  Unable to assess  Dorsal Hand  Unable to assess  Patellar Region  Unable to assess  Anterior Thigh Region  Unable to assess  Posterior Calf Region  Unable to assess  Edema (RD Assessment)  Unable to assess  Hair  Unable to assess  Eyes  Unable to assess  Mouth  Unable to assess  Skin  Unable to assess  Nails  Unable to assess       Diet Order:   Diet Order            Diet NPO time specified  Diet effective midnight        Diet Heart Room service appropriate? Yes; Fluid consistency: Thin  Diet effective now              EDUCATION NEEDS:   No education needs have been identified at this time  Skin:  Skin Assessment: Skin Integrity Issues: Skin Integrity Issues:: Other (Comment) Other: lt thigh abscess with cellulits  Last BM:  06/11/18  Height:   Ht Readings from Last 1 Encounters:  06/11/18 5' (1.524 m)     Weight:   Wt Readings from Last 1 Encounters:  06/13/18 83.4 kg    Ideal Body Weight:  45.5 kg  BMI:  Body mass index is 35.92 kg/m.  Estimated Nutritional Needs:   Kcal:  1650-1850  Protein:  85-100 grams  Fluid:  > 1.6 L    Phi Avans A. Mayford KnifeWilliams, RD, LDN, CDCES Registered Dietitian II Certified Diabetes Care and Education Specialist Pager: [phone] After hours Pager: [phone]

## 2018-06-13 NOTE — Progress Notes (Signed)
ANTICOAGULATION CONSULT NOTE - Follow-Up Consult  Pharmacy Consult for heparin Indication: atrial fibrillation (Eliquis on hold)  Allergies  Allergen Reactions  . Zanaflex [Tizanidine Hcl] Shortness Of Breath, Other (See Comments) and Cough    Dehydration - reported by [facility] 10/07/13  . Alprazolam Other (See Comments)    Dry mouth - reported by [facility] 05/13/14   . Carisoprodol Other (See Comments)    Dizziness - reported by [facility] 05/13/14   . Chlorthalidone Other (See Comments)    syncope  . Colesevelam Other (See Comments)    GI upset and stool change - reported by [facility] 05/13/14   . Dilaudid [Hydromorphone Hcl] Hives and Swelling  . Duloxetine Other (See Comments)    Dizziness - reported by [facility] 05/13/14   . Felodipine Other (See Comments)    Dizziness, groin pain - reported by [facility] 05/13/14   . Fosamax [Alendronate Sodium] Nausea And Vomiting and Other (See Comments)    Back and hip pain  . Furosemide Other (See Comments)    Dry mouth/dizziness - reported by [facility] 05/13/14   . Gabapentin Other (See Comments)    Insomnia - reported by [facility] 05/13/14   . Ibuprofen Other (See Comments)    GI pain - reported by [facility] 05/13/14   . Lovastatin Other (See Comments)    Weakness - reported by [facility] 05/13/14   . Meclizine Other (See Comments)    Sleeplessness - reported by [facility] 05/13/14   . Meperidine Other (See Comments)    Jitteriness - reported by [facility] 05/13/14   . Methadone Other (See Comments)    Stool impaction - reported by [facility] 05/13/14   . Morphine Other (See Comments)    Dizziness - reported by [facility] 05/13/14   . Niacin Other (See Comments)    Flushing - reported by [facility] 05/13/14   . Omeprazole Other (See Comments)    Sleeplessness - reported by [facility] 05/13/14   . Oxycodone Other (See Comments)    Groin pain - reported by  [facility] 05/13/14   . Pravastatin Other (See Comments)    Weakness - reported by [facility] 05/13/14   . Simvastatin Other (See Comments)    Weakness - reported by [facility] 05/13/14   . Statins Other (See Comments)    Myalgia, weakness - reported by [facility] 05/13/14  . Tramadol Other (See Comments)    Nervous - reported by [facility] 05/13/14   . Valsartan Other (See Comments)    Dizziness - reported by [facility] 05/13/14   . Hydrocodone-Acetaminophen Nausea And Vomiting    reported by [facility] 05/13/14     Patient Measurements: Height: 5' (152.4 cm) Weight: 183 lb 14.4 oz (83.4 kg) IBW/kg (Calculated) : 45.5  Vital Signs: Temp Source: Oral (05/08 0709) BP: 154/77 (05/08 0409) Pulse Rate: 61 (05/08 0409)  Labs: Recent Labs    06/11/18 0918 06/11/18 0929 06/12/18 0542 06/12/18 2137 06/13/18 0717  HGB 13.7  --  12.6  --  13.1  HCT 44.3  --  40.2  --  41.5  PLT 149*  --  146*  --  149*  APTT 63*  --  63* 114* 170*  LABPROT 23.2*  --  23.9*  --   --   INR  2.1*  --  2.2*  --   --   HEPARINUNFRC  --   --   --   --  >2.20*  CREATININE 1.15* 1.20* 1.14*  --  0.93    Estimated Creatinine Clearance: 43.1 mL/min (by C-G formula based on SCr of 0.93 mg/dL).  Assessment: 85 yo F with left sided weakness, facial droop, slurred speech on apixaban PTA for AFib switched to heparin for possible procedures.  Heparin infusing at 700 units/hr with aPTT elevated to 170 (goal 66-102).  Of note, patient's baseline aPTT was elevated at 63.  No bleeding noted.  Spoke with RN and gave verbal instructions to stop heparin at 0910 this morning.  Goal of Therapy:  Heparin level 0.3-0.7 units/ml aPTT 66 - 102 seconds Monitor platelets by anticoagulation protocol: Yes   Plan:  Hold heparin until 1030am. Restart heparin at reduced rate of 450 units/hr.  Recheck aPTT in 8hr Daily heparin level, CBC, and aPTT  Toys 'R' UsKimberly Sandia Pfund, Pharm.D., BCPS Clinical  Pharmacist Clinical phone for 06/13/2018 from 8:30-4:00 is [phone].  **Pharmacist phone directory can now be found on [email or web address] ([email or web address]).  Listed under [facility].  06/13/2018 9:36 AM

## 2018-06-13 NOTE — Anesthesia Preprocedure Evaluation (Deleted)
Anesthesia Evaluation    Reviewed: Allergy & Precautions, Patient's Chart, lab work & pertinent test results  History of Anesthesia Complications Negative for: history of anesthetic complications  Airway        Dental   Pulmonary sleep apnea ,           Cardiovascular hypertension, Pt. on medications +CHF  + dysrhythmias Atrial Fibrillation    Care Everywhere Carotid US 2019 - 40-59% right ICAS, 1-39% left ICAS  TTE 2019 - EF 65-70%. Diastolic function appears indeterminate. Mild mitral regurgitation. Mild AI. Estimated RVSP 55 mm Hg.    Neuro/Psych CVA negative psych ROS   GI/Hepatic negative GI ROS, Neg liver ROS,   Endo/Other  negative endocrine ROS Obesity   Renal/GU Renal InsufficiencyRenal disease     Musculoskeletal negative musculoskeletal ROS (+)   Abdominal   Peds  Hematology negative hematology ROS (+)   Anesthesia Other Findings   Reproductive/Obstetrics                             Anesthesia Physical Anesthesia Plan  ASA: III  Anesthesia Plan: General   Post-op Pain Management:    Induction: Intravenous  PONV Risk Score and Plan: 3 and Treatment may vary due to age or medical condition and Ondansetron  Airway Management Planned: LMA  Additional Equipment: None  Intra-op Plan:   Post-operative Plan: Extubation in OR  Informed Consent:   Plan Discussed with: CRNA and Anesthesiologist  Anesthesia Plan Comments:         Anesthesia Quick Evaluation

## 2018-06-13 NOTE — Progress Notes (Addendum)
Subjective: CC: Left thigh abscess Patient answering questions this morning. Reports pain to left thigh and nausea. Has eaten a small amount this morning. She is still angry. I spoke with the patients POA on the phone. She did ask that if possible do not mention her name as this is a trigger for her Aunt  Objective: Vital signs in last 24 hours: Pulse Rate:  [58-63] 61 (05/08 0409) Resp:  [18] 18 (05/08 0409) BP: (130-159)/(74-117) 154/77 (05/08 0409) SpO2:  [95 %-98 %] 95 % (05/08 0409) Weight:  [83.4 kg] 83.4 kg (05/08 0410) Last BM Date: 06/11/18  Intake/Output from previous day: 05/07 0701 - 05/08 0700 In: 410.2 [P.O.:340; I.V.:70.2] Out: 850 [Urine:850] Intake/Output this shift: No intake/output data recorded.  PE: Gen: Agitated.  Lungs: Normal effort Abd: Soft, NT, ND, +BS Left thigh: Large area of induration and erythema area with a more central area of fluctuance. No drainage. Tender to touch.   Lab Results:  Recent Labs    06/12/18 0542 06/13/18 0717  WBC 13.6* 8.4  HGB 12.6 13.1  HCT 40.2 41.5  PLT 146* 149*   BMET Recent Labs    06/12/18 0542 06/13/18 0717  NA 141 145  K 4.3 4.0  CL 105 107  CO2 25 22  GLUCOSE 90 81  BUN 37* 27*  CREATININE 1.14* 0.93  CALCIUM 9.0 9.2   PT/INR Recent Labs    06/11/18 0918 06/12/18 0542  LABPROT 23.2* 23.9*  INR 2.1* 2.2*   CMP     Component Value Date/Time   NA 145 06/13/2018 0717   K 4.0 06/13/2018 0717   CL 107 06/13/2018 0717   CO2 22 06/13/2018 0717   GLUCOSE 81 06/13/2018 0717   BUN 27 (H) 06/13/2018 0717   CREATININE 0.93 06/13/2018 0717   CALCIUM 9.2 06/13/2018 0717   PROT 7.1 06/11/2018 0918   ALBUMIN 3.3 (L) 06/11/2018 0918   AST 31 06/11/2018 0918   ALT 22 06/11/2018 0918   ALKPHOS 128 (H) 06/11/2018 0918   BILITOT 0.9 06/11/2018 0918   GFRNONAA 56 (L) 06/13/2018 0717   GFRAA >60 06/13/2018 0717   Lipase  No results found for: LIPASE     Studies/Results: Ct Head Wo  Contrast  Result Date: 06/11/2018 CLINICAL DATA:  Left-sided weakness. Left-sided facial droop. Worsened confusion. EXAM: CT HEAD WITHOUT CONTRAST TECHNIQUE: Contiguous axial images were obtained from the base of the skull through the vertex without intravenous contrast. COMPARISON:  10/05/2017 FINDINGS: Brain: The brainstem and cerebellum are unremarkable. Left cerebral hemisphere shows minimal small vessel change of the white matter. On the right, there has been old infarction in the right basal ganglia and insula which has progressed to atrophy, encephalomalacia and gliosis. There is old appearing infarction in the right parietal lobe and temporoparietal junction, which was not visibly affected in August of last year. This does not appear acute however. There is no identifiable acute infarction. No mass lesion, hemorrhage, hydrocephalus or extra-axial collection. Vascular: There is atherosclerotic calcification of the major vessels at the base of the brain. Skull: Negative Sinuses/Orbits: Some fluid layering in the right division of the sphenoid sinus. Other sinuses are clear. Orbits are negative. 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 The area of involvement is more extensive than was seen in August of last year, with more involvement of the temporoparietal junction and posterior parietal lobe, but the findings do not look acute  today. No hemorrhage or mass effect. Electronically Signed   By: Paulina Fusi M.D.   On: 06/11/2018 10:17   Ct Femur Left W Contrast  Result Date: 06/11/2018 CLINICAL DATA:  Left thigh swelling and pain. Concern for soft tissue infection. EXAM: CT OF THE LOWER RIGHT EXTREMITY WITH CONTRAST TECHNIQUE: Multidetector CT imaging of the lower right extremity was performed according to the standard protocol following intravenous contrast administration. COMPARISON:  Left femur x-rays from same day. CONTRAST:   OMNIPAQUE IOHEXOL 300 MG/ML  SOLN FINDINGS: Bones/Joint/Cartilage No acute fracture or dislocation. No osseous destruction or periosteal reaction. Sclerosis within the left femoral head. Moderate left hip joint space narrowing. Prior left total knee arthroplasty. No evidence of hardware failure or loosening. No hip or knee joint effusion. Ligaments Suboptimally assessed by CT. Muscles and Tendons Grossly intact. Severe left gluteus medius muscle atrophy. Unchanged small lipoma in the proximal lateral gastrocnemius muscle. Soft tissues In the superficial medial aspect of the left mid thigh, there is a 3.5 x 2.0 by 1.8 cm complex, predominantly low-density lesion with thick rim enhancement. There is prominent overlying skin thickening of the medial thigh and mild surrounding fat stranding. No subcutaneous emphysema. Large stool ball in the rectum.  No lymphadenopathy. IMPRESSION: 1. 3.5 cm complex, predominantly low-density lesion with thick rim enhancement in the superficial medial aspect of the left mid thigh. Appearance is most suggestive of an abscess given overlying skin thickening and surrounding soft tissue inflammatory changes suggestive of cellulitis, however a necrotic mass could have a similar appearance. 2. No subcutaneous emphysema or deeper soft tissue infection. 3. 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 Electronically Signed   By: Obie Dredge M.D.   On: 06/11/2018 11:55   Dg Femur Portable Min 2 Views Left  Result Date: 06/11/2018 CLINICAL DATA:  Left medial thigh infection. EXAM: LEFT FEMUR PORTABLE 2 VIEWS COMPARISON:  None. FINDINGS: One-view does not show any bone abnormality or radiopaque foreign object. IMPRESSION: No specific or significant finding on this one view examination. Electronically Signed   By: Paulina Fusi M.D.   On: 06/11/2018 09:58    Anti-infectives: Anti-infectives (From admission, onward)   Start      Dose/Rate Route Frequency Ordered Stop   06/13/18 1200  vancomycin (VANCOCIN) 1,500 mg in sodium chloride 0.9 % 500 mL IVPB     1,500 mg 250 mL/hr over 120 Minutes Intravenous Every 48 hours 06/11/18 1233     06/11/18 2200  ceFAZolin (ANCEF) IVPB 1 g/50 mL premix     1 g 100 mL/hr over 30 Minutes Intravenous Every 8 hours 06/11/18 1335     06/11/18 1300  vancomycin (VANCOCIN) 1,750 mg in sodium chloride 0.9 % 500 mL IVPB     1,750 mg 250 mL/hr over 120 Minutes Intravenous  Once 06/11/18 1233 06/11/18 1512   06/11/18 1215  ceFAZolin (ANCEF) IVPB 1 g/50 mL premix     1 g 100 mL/hr over 30 Minutes Intravenous  Once 06/11/18 1212 06/11/18 1300   06/11/18 1215  vancomycin (VANCOCIN) IVPB 1000 mg/200 mL premix  Status:  Discontinued     1,000 mg 200 mL/hr over 60 Minutes Intravenous  Once 06/11/18 1212 06/11/18 1233       Assessment/Plan A fib on eliquis H/o CVA with residual left sided weakness HLD CHF OSA on 2L O2 Eva at night Bedbound  + Blood Cultures (Staph) Left thigh abscess - Patient with a3.5 cmabscess mid left  medial thigh with diffuse cellulitis.  - Per psych "Patient lacks capacity to consent to surgery and refuse current medical treatment." - I discussed the patient's case to the patients POA over the phone, both yesterday afternoon and this morning. After discussion with other family members the patients niece would like to proceed with surgery tomorrow. She understands the risk of general anesthesia including but not limited to death, CVA, MI, and patient being unable to be extubated. We discussed risks of surgery including but not limited to infection, blood loss, injury to muscle/tissue/vasculature. She would like to proceed. Adequate time was given for all questions to be asked and answered. Ms. Roger Shelter expressed thanks for the phone call and help in her Aunt's care.  - Continue broad spectrum antibiotics  ID - ancef/vancomycin 5/6>> WBC 8.4 VTE -SCDs, heparin (hold  morning of surgery) FEN -HH diet, NPO after midnight Foley -none Follow up -TBD POC - POA - [name] [phone])   LOS: 1 day    Jacinto Halim , [facility] Surgery 06/13/2018, 9:21 AM Pager: [phone]

## 2018-06-13 NOTE — Progress Notes (Signed)
PROGRESS NOTE    Margaret Collier  GYB:[id number] DOB: [birth year] DOA: 06/11/2018 PCP: System, Pcp Not In    Brief Narrative 85 year old with past medical history significant for hypertension, hyperlipidemia, chronic A. fib on Eliquis oxygen dependent at night 2 L, CVA with residual left-sided weakness, OSA who presents complaining with worsening confusion and a speech problem.  History has been limited to get from the patient due to history of dementia and report of increased; confusion. Patient is bedbound at baseline, at facility they use a Hoyer lift to get her up in the chair. Evaluation and admission: A CT head showed areas of previous stroke.  CT scan left femur showed 3.5 cm abscess left mid thigh. Patient also was noted to be bradycardic with heart rate in the 40s.  Patient was refusing care, and surgery. She has been confused.  She was evaluated by Psychiatrist, who recommended that patient lack capacity to consent for surgery or refuse medical care. Surgery was consulted, patient will have I and D done tomorrow.   Blood culture 1 out 4 bottle grew Staph coagulase negative. Probably a contaminant.   Assessment & Plan:   Principal Problem:   Evaluation by psychiatric service required Active Problems:   Atrial fibrillation (HCC)   OSA (obstructive sleep apnea)   History of CVA with residual deficit   Bradycardia   Cellulitis and abscess of left leg   AKI (acute kidney injury) (HCC)   Thrombocytopenia (HCC)  1-Left thigh abscess; cellulitis Patient present with confusion, leukocytosis, worsening redness of left lower extremity.  She failed oral antibiotic. -CT showed focal area of abscess. -Surgery has been consulted and is recommending IV antibiotics for 48 hours and patient might require I and D. -Patient lack capacity to consent for surgery or refusing medical care.  -for surgery 5-09. -Continue with IV antibiotics.   Blood culture positive 1--4 bottle. Discussed with ID,  Dr Luciana Axe probably a contaminant. No need to get 2 ECHO. He agree with repeating blood cultures.  Staph, coagulase negative.  On IV vancomycin.  2-Acute metabolic encephalopathy; He was transferred from her facility with increased confusion and questionable worsening left side weakness. CT head was negative. She Is alert and oriented x3.  She has been refusing care.  Psychiatric consulted for evaluation for capacity, patient lacks capacity to consent for surgery or refusing medical care.   3-Bradycardia, asymptomatic Per cardiology  no current indication for pacer at this time.  Also patient currently with an infection. Not a good  Candidate for pacer at this pint in time due to infection.   4-permanent A. fib on Eliquis: Eliquis on hold due to possible surgical procedure. Cardiology  recommending heparin.  Continue with heparin per pharmacy  5-History of dementia 6-mild chronic thrombocytopenia  7-AKI: Prior creatinine around 0.8.  Suspect prerenal.  Continue with IV fluids.  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  9-Constipation;  MiraLAX.  10-chronic diastolic heart failure: Holding Lasix due to AKI and infection. 11-history of CVA with residual left-sided weakness, neuropathy: Repeated CT head negative. 12-OSA: Continue with 2 L of oxygen at night.  Estimated body mass index is 35.92 kg/m as calculated from the following:   Height as of this encounter: 5' (1.524 m).   Weight as of this encounter: 83.4 kg.   DVT prophylaxis: Was on Eliquis prior to admission, currently on hold due to possible surgical procedure Code Status: Full code Family Communication;  Niece  updated.  Disposition Plan: Patient needs to remain in the hospital for IV antibiotics, she failed oral antibiotic for left thigh abscess.  Continue with antibiotics, plan for ID 5/9. Awaiting eliquis wash out.  Consultants:   Cardiology  Surgery     Procedures:   none   Antimicrobials:   Ancef 5-06  Vancomycin 5-06   Subjective: Patient is alert, she is more cooperative today. She took her meds. She is complaining of thigh pain.  She had BM on admission after enema. No BM since admission   Objective: Vitals:   06/13/18 0409 06/13/18 0410 06/13/18 0709 06/13/18 1150  BP: (!) 154/77   (!) 148/76  Pulse: 61   62  Resp: 18   18  Temp:    97.7 F (36.5 C)  TempSrc:   Oral Oral  SpO2: 95%   97%  Weight:  83.4 kg    Height:        Intake/Output Summary (Last 24 hours) at 06/13/2018 1239 Last data filed at 06/13/2018 0416 Gross per 24 hour  Intake 170.19 ml  Output 600 ml  Net -429.81 ml   Filed Weights   06/11/18 1636 06/12/18 0541 06/13/18 0410  Weight: 82.3 kg 82.2 kg 83.4 kg    Examination:  General exam: she is more calm today.  Respiratory system: CTA Cardiovascular system: S 1, S 2 IRR Gastrointestinal system: BS present, soft, mild tenderness Central nervous system: chronic left side weakness, left arm contraction.  Extremities: edema left leg Skin: redness and edema left thigh persist.     Data Reviewed: I have personally reviewed following labs and imaging studies  CBC: Recent Labs  Lab 06/11/18 0918 06/12/18 0542 06/13/18 0717  WBC 11.0* 13.6* 8.4  NEUTROABS 7.8*  --   --   HGB 13.7 12.6 13.1  HCT 44.3 40.2 41.5  MCV 83.4 82.2 81.9  PLT 149* 146* 149*   Basic Metabolic Panel: Recent Labs  Lab 06/11/18 0918 06/11/18 0929 06/12/18 0542 06/13/18 0717  NA 141  --  141 145  K 4.2  --  4.3 4.0  CL 101  --  105 107  CO2 28  --  25 22  GLUCOSE 77  --  90 81  BUN 46*  --  37* 27*  CREATININE 1.15* 1.20* 1.14* 0.93  CALCIUM 9.7  --  9.0 9.2  MG  --   --  2.1  --    GFR: Estimated Creatinine Clearance: 43.1 mL/min (by C-G formula based on SCr of 0.93 mg/dL). Liver Function Tests: Recent Labs  Lab 06/11/18 0918  AST 31  ALT 22  ALKPHOS 128*  BILITOT 0.9  PROT 7.1  ALBUMIN  3.3*   No results for input(s): LIPASE, AMYLASE in the last 168 hours. No results for input(s): AMMONIA in the last 168 hours. Coagulation Profile: Recent Labs  Lab 06/11/18 0918 06/12/18 0542  INR 2.1* 2.2*   Cardiac Enzymes: No results for input(s): CKTOTAL, CKMB, CKMBINDEX, TROPONINI in the last 168 hours. BNP (last 3 results) No results for input(s): PROBNP in the last 8760 hours. HbA1C: No results for input(s): HGBA1C in the last 72 hours. CBG: No results for input(s): GLUCAP in the last 168 hours. Lipid Profile: No results for input(s): CHOL, HDL, LDLCALC, TRIG, CHOLHDL, LDLDIRECT in the last 72 hours. Thyroid Function Tests: No results for input(s): TSH, T4TOTAL, FREET4, T3FREE, THYROIDAB in the last 72 hours. Anemia Panel: No results for input(s): VITAMINB12, FOLATE, FERRITIN, TIBC, IRON, RETICCTPCT in the  last 72 hours. Sepsis Labs: Recent Labs  Lab 06/11/18 4098 06/11/18 1113  LATICACIDVEN 0.9 1.0    Recent Results (from the past 240 hour(s))  Culture, blood (routine x 2)     Status: Abnormal (Preliminary result)   Collection Time: 06/11/18  9:18 AM  Result Value Ref Range Status   Specimen Description BLOOD RIGHT ANTECUBITAL  Final   Special Requests   Final    BOTTLES DRAWN AEROBIC AND ANAEROBIC Blood Culture adequate volume   Culture  Setup Time   Final    GRAM POSITIVE COCCI ANAEROBIC BOTTLE ONLY CRITICAL RESULT CALLED TO, READ BACK BY AND VERIFIED WITH: R. FANNING PHARMD, AT 1334 06/12/18 BY D. VANHOOK    Culture (A)  Final    STAPHYLOCOCCUS SPECIES (COAGULASE NEGATIVE) THE SIGNIFICANCE OF ISOLATING THIS ORGANISM FROM A SINGLE SET OF BLOOD CULTURES WHEN MULTIPLE SETS ARE DRAWN IS UNCERTAIN. PLEASE NOTIFY THE MICROBIOLOGY DEPARTMENT WITHIN ONE WEEK IF SPECIATION AND SENSITIVITIES ARE REQUIRED. Performed at [facility], [address], [place], [place] [address]    Report Status PENDING  Incomplete  Culture, blood (routine x 2)     Status: None  (Preliminary result)   Collection Time: 06/11/18  9:18 AM  Result Value Ref Range Status   Specimen Description BLOOD BLOOD RIGHT FOREARM  Final   Special Requests   Final    BOTTLES DRAWN AEROBIC AND ANAEROBIC Blood Culture adequate volume   Culture   Final    NO GROWTH 2 DAYS Performed at [facility], [address], [place], [place] [address]    Report Status PENDING  Incomplete  SARS Coronavirus 2 (CEPHEID - Performed in [facility] hospital lab), Hosp Order     Status: None   Collection Time: 06/11/18  9:18 AM  Result Value Ref Range Status   SARS Coronavirus 2 NEGATIVE NEGATIVE Final    Comment: (NOTE) If result is NEGATIVE SARS-CoV-2 target nucleic acids are NOT DETECTED. The SARS-CoV-2 RNA is generally detectable in upper and lower  respiratory specimens during the acute phase of infection. The lowest  concentration of SARS-CoV-2 viral copies this assay can detect is 250  copies / mL. A negative result does not preclude SARS-CoV-2 infection  and should not be used as the sole basis for treatment or other  patient management decisions.  A negative result may occur with  improper specimen collection / handling, submission of specimen other  than nasopharyngeal swab, presence of viral mutation(s) within the  areas targeted by this assay, and inadequate number of viral copies  (<250 copies / mL). A negative result must be combined with clinical  observations, patient history, and epidemiological information. If result is POSITIVE SARS-CoV-2 target nucleic acids are DETECTED. The SARS-CoV-2 RNA is generally detectable in upper and lower  respiratory specimens dur ing the acute phase of infection.  Positive  results are indicative of active infection with SARS-CoV-2.  Clinical  correlation with patient history and other diagnostic information is  necessary to determine patient infection status.  Positive results do  not rule out bacterial infection or co-infection with  other viruses. If result is PRESUMPTIVE POSTIVE SARS-CoV-2 nucleic acids MAY BE PRESENT.   A presumptive positive result was obtained on the submitted specimen  and confirmed on repeat testing.  While 2019 novel coronavirus  (SARS-CoV-2) nucleic acids may be present in the submitted sample  additional confirmatory testing may be necessary for epidemiological  and / or clinical management purposes  to  differentiate between  SARS-CoV-2 and other Sarbecovirus currently known to infect humans.  If clinically indicated additional testing with an alternate test  methodology [phone]) is advised. The SARS-CoV-2 RNA is generally  detectable in upper and lower respiratory sp ecimens during the acute  phase of infection. The expected result is Negative. Fact Sheet for Patients:  BoilerBrush.com.cy Fact Sheet for Healthcare Providers: https://pope.com/ This test is not yet approved or cleared by the [place] FDA and has been authorized for detection and/or diagnosis of SARS-CoV-2 by FDA under an Emergency Use Authorization (EUA).  This EUA will remain in effect (meaning this test can be used) for the duration of the COVID-19 declaration under Section 564(b)(1) of the Act, 21 U.S.C. section 360bbb-3(b)(1), unless the authorization is terminated or revoked sooner. Performed at [facility], [address], [place], [place] [address]   Blood Culture ID Panel (Reflexed)     Status: Abnormal   Collection Time: 06/11/18  9:18 AM  Result Value Ref Range Status   Enterococcus species NOT DETECTED NOT DETECTED Final   Listeria monocytogenes NOT DETECTED NOT DETECTED Final   Staphylococcus species DETECTED (A) NOT DETECTED Final    Comment: Methicillin (oxacillin) susceptible coagulase negative staphylococcus. Possible blood culture contaminant (unless isolated from more than one blood culture draw or clinical case suggests pathogenicity). No  antibiotic treatment is indicated for blood  culture contaminants. CRITICAL RESULT CALLED TO, READ BACK BY AND VERIFIED WITH: R. FANNING PHARMD, AT 1334 06/12/18 BY D. VANHOOK    Staphylococcus aureus (BCID) NOT DETECTED NOT DETECTED Final   Methicillin resistance NOT DETECTED NOT DETECTED Final   Streptococcus species NOT DETECTED NOT DETECTED Final   Streptococcus agalactiae NOT DETECTED NOT DETECTED Final   Streptococcus pneumoniae NOT DETECTED NOT DETECTED Final   Streptococcus pyogenes NOT DETECTED NOT DETECTED Final   Acinetobacter baumannii NOT DETECTED NOT DETECTED Final   Enterobacteriaceae species NOT DETECTED NOT DETECTED Final   Enterobacter cloacae complex NOT DETECTED NOT DETECTED Final   Escherichia coli NOT DETECTED NOT DETECTED Final   Klebsiella oxytoca NOT DETECTED NOT DETECTED Final   Klebsiella pneumoniae NOT DETECTED NOT DETECTED Final   Proteus species NOT DETECTED NOT DETECTED Final   Serratia marcescens NOT DETECTED NOT DETECTED Final   Haemophilus influenzae NOT DETECTED NOT DETECTED Final   Neisseria meningitidis NOT DETECTED NOT DETECTED Final   Pseudomonas aeruginosa NOT DETECTED NOT DETECTED Final   Candida albicans NOT DETECTED NOT DETECTED Final   Candida glabrata NOT DETECTED NOT DETECTED Final   Candida krusei NOT DETECTED NOT DETECTED Final   Candida parapsilosis NOT DETECTED NOT DETECTED Final   Candida tropicalis NOT DETECTED NOT DETECTED Final    Comment: Performed at [facility], [address] [place], [place] [address]  MRSA PCR Screening     Status: Abnormal   Collection Time: 06/11/18 11:55 PM  Result Value Ref Range Status   MRSA by PCR POSITIVE (A) NEGATIVE Final    Comment:        The GeneXpert MRSA Assay (FDA approved for NASAL specimens only), is one component of a comprehensive MRSA colonization surveillance program. It is not intended to diagnose MRSA infection nor to guide or monitor treatment for MRSA infections.  RESULT CALLED TO, READ BACK BY AND VERIFIED WITH: L. Sheliah Plane [id number] 06/12/2018 Girtha Hake Performed at [facility], [address], [place], [place] [address]          Radiology Studies: No results found.  Scheduled Meds: . atorvastatin  40 mg Oral q1800  . calcium carbonate  1 tablet Oral TID AC  . chlorhexidine  15 mL Mouth/Throat BID  . pantoprazole  40 mg Oral Q0600  . pregabalin  25 mg Oral Q8H  . sodium chloride flush  3 mL Intravenous Q12H   Continuous Infusions: . sodium chloride 50 mL/hr at 06/12/18 1841  .  ceFAZolin (ANCEF) IV 1 g (06/13/18 0706)  . heparin 450 Units/hr (06/13/18 1038)  . vancomycin       LOS: 1 day    Time spent: 35 minutes    Alba Cory, MD [facility] Pager [phone]  If 7PM-7AM, please contact night-coverage [email or web address] Password [facility] 06/13/2018, 12:39 PM

## 2018-06-13 NOTE — [place] FL2 (Signed)
[place] MEDICAID FL2 LEVEL OF CARE SCREENING TOOL     IDENTIFICATION  Patient Name: Margaret RobesDorothy Domagalski Birthdate: 06-09-1931 Sex: female Admission Date (Current Location): 06/11/2018  [place]   Facility and Address:  [facility], [address], [place], [place] [address]      Provider Number: [id number]  Attending Physician Name and Address:  Alba Coryegalado, Belkys A, MD  Relative Name and Phone Number:       Current Level of Care: Hospital Recommended Level of Care: Skilled Nursing Facility Prior Approval Number:    Date Approved/Denied:   PASRR Number: [id number]  Discharge Plan: SNF    Current Diagnoses: Patient Active Problem List   Diagnosis Date Noted  . Evaluation by psychiatric service required   . Cellulitis and abscess of left leg 06/11/2018  . AKI (acute kidney injury) (HCC) 06/11/2018  . Thrombocytopenia (HCC) 06/11/2018  . Bradycardia 01/21/2018  . Acute exacerbation of CHF (congestive heart failure) (HCC) 01/17/2018  . Atrial fibrillation (HCC) 01/17/2018  . Hypertension 01/17/2018  . Hyperlipidemia 01/17/2018  . OSA (obstructive sleep apnea) 01/17/2018  . History of CVA with residual deficit 01/17/2018    Orientation RESPIRATION BLADDER Height & Weight     Self, Time, Situation, Place(But lacks capacity to refuse medical care per psychiatry consult note.)  Normal Incontinent Weight: 183 lb 14.4 oz (83.4 kg) Height:  5' (152.4 cm)  BEHAVIORAL SYMPTOMS/MOOD NEUROLOGICAL BOWEL NUTRITION STATUS  Other (Comment)(Anxious, Irritable, Agitated, Paranoid, Restless) (History of CVA) Incontinent Diet(Heart healthy)  AMBULATORY STATUS COMMUNICATION OF NEEDS Skin     Verbally Other (Comment)(Skin tear.)                       Personal Care Assistance Level of Assistance              Functional Limitations Info  Sight, Hearing, Speech Sight Info: Adequate Hearing Info: Adequate Speech Info:  Adequate    SPECIAL CARE FACTORS FREQUENCY                       Contractures Contractures Info: Present(Left arm)    Additional Factors Info  Code Status, Allergies, Isolation Precautions Code Status Info: Full Allergies Info: Zanaflex (Tizanidine Hcl), Alprazolam, Carisoprodol, Chlorthalidone, Colesevelam, Dilaudid (Hydromorphone Hcl), Duloxetine, Felodipine, Fosamax (Alendronate Sodium), Furosemide, Gabapentin, Ibuprofen, Lovastatin, Meclizine, Meperidine, Methadone, Morphine, Niacin, Omeprazole, Oxycodone, Pravastatin, Simvastatin, Statins, Tramadol, Valsartan, Hydrocodone-acetaminophen     Isolation Precautions Info: Contact: MRSA     Current Medications (06/13/2018):  This is the current hospital active medication list Current Facility-Administered Medications  Medication Dose Route Frequency Provider Last Rate Last Dose  . 0.9 %  sodium chloride infusion   Intravenous Continuous Madelyn FlavorsSmith, Rondell A, MD 50 mL/hr at 06/12/18 1841    . acetaminophen (TYLENOL) tablet 650 mg  650 mg Oral Q6H PRN Clydie BraunSmith, Rondell A, MD       Or  . acetaminophen (TYLENOL) suppository 650 mg  650 mg Rectal Q6H PRN Smith, Rondell A, MD      . albuterol (PROVENTIL) (2.5 MG/3ML) 0.083% nebulizer solution 2.5 mg  2.5 mg Nebulization Q4H PRN Smith, Rondell A, MD      . atorvastatin (LIPITOR) tablet 40 mg  40 mg Oral q1800 Madelyn FlavorsSmith, Rondell A, MD   40 mg at 06/11/18 1832  . calcium carbonate (TUMS - dosed in mg elemental calcium) chewable tablet 200 mg of elemental calcium  1 tablet Oral TID AC  Madelyn Flavors A, MD   200 mg of elemental calcium at 06/13/18 0933  . ceFAZolin (ANCEF) IVPB 1 g/50 mL premix  1 g Intravenous Q8H Smith, Rondell A, MD 100 mL/hr at 06/13/18 0706 1 g at 06/13/18 0706  . chlorhexidine (PERIDEX) 0.12 % solution 15 mL  15 mL Mouth/Throat BID Madelyn Flavors A, MD   15 mL at 06/13/18 0933  . heparin ADULT infusion 100 units/mL (25000 units/23mL sodium chloride 0.45%)  450 Units/hr Intravenous  Continuous Hammons, Kimberly B, RPH      . ondansetron (ZOFRAN) tablet 4 mg  4 mg Oral Q6H PRN Madelyn Flavors A, MD       Or  . ondansetron (ZOFRAN) injection 4 mg  4 mg Intravenous Q6H PRN Smith, Rondell A, MD      . oxyCODONE (Oxy IR/ROXICODONE) immediate release tablet 5 mg  5 mg Oral Q4H PRN Smith, Rondell A, MD      . pantoprazole (PROTONIX) EC tablet 40 mg  40 mg Oral Q0600 Madelyn Flavors A, MD   40 mg at 06/11/18 1832  . phenol (CHLORASEPTIC) mouth spray 2 spray  2 spray Mouth/Throat Q2H PRN Smith, Rondell A, MD      . polyethylene glycol (MIRALAX / GLYCOLAX) packet 17 g  17 g Oral Daily PRN Smith, Rondell A, MD      . pregabalin (LYRICA) capsule 25 mg  25 mg Oral Q8H Smith, Rondell A, MD   25 mg at 06/12/18 1509  . sodium chloride flush (NS) 0.9 % injection 3 mL  3 mL Intravenous Q12H Smith, Rondell A, MD   3 mL at 06/12/18 0323  . vancomycin (VANCOCIN) 1,500 mg in sodium chloride 0.9 % 500 mL IVPB  1,500 mg Intravenous Q48H Little, Ambrose Finland, MD         Discharge Medications: Please see discharge summary for a list of discharge medications.  Relevant Imaging Results:  Relevant Lab Results:   Additional Information SS#: [id number]  Margarito Liner, LCSW

## 2018-06-13 NOTE — Progress Notes (Signed)
ANTICOAGULATION CONSULT NOTE - Follow-Up Consult  Pharmacy Consult for heparin Indication: atrial fibrillation (Eliquis on hold)  Allergies  Allergen Reactions  . Zanaflex [Tizanidine Hcl] Shortness Of Breath, Other (See Comments) and Cough    De[facility] - [facility] 10/07/13  . Alprazolam Other (See Comments)    Dry mouth - reported by [facility] 05/13/14   . Carisoprodol Other (See Comments)    Dizziness - reported by [facility] 05/13/14   . Chlorthalidone Other (See Comments)    syncope  . Colesevelam Other (See Comments)    GI upset and stool change - reported by [facility] 05/13/14   . Dilaudid [Hydromorphone Hcl] Hives and Swelling  . Duloxetine Other (See Comments)    Dizziness - [facility]   . Felodipine Other (See Comments)    Dizziness, groin pain - reported by [facility] 05/13/[facility]   . Fosamax [Alendronate Sodium] Nausea And Vomiting and Other (See Comments)    Back and hip pain  . Furosemide Other (See Comments)    Dry mouth/dizziness - reported [facility] 05/13/[facility]   . Gabapentin Other (See Comments)    Insomnia - [facility]   . Ibuprofen Other (See Comments)    GI pain - reported by [facility] 05/13/14   . Lovastatin Other (See Comments)    Weakness - reported [facility] 05/13/[facility]   . Meclizine Other (See Comments)    Sleeplessness - reported by [facility] 05/13/14   . Meperidine Other (See Comments)    Jitteriness - reported [facility] 05/13/[facility]   . Methadone Other (See Comments)    Stool impaction - reported [facility] 05/13/[facility]   . Morphine Other (See Comments)    Dizziness - [facility]   . Niacin Other (See Comments)    Flushing - reported by [facility] 05/13/14   . Omeprazole Other (See Comments)    Sleeplessness - reported by [facility] 05/13/14   . Oxycodone Other (See Comments)    Groin pain - reported by  [facility] 05/13/14   . Pravastatin Other (See Comments)    Weakness - [facility]   . Simvastatin Other (See Comments)    Weakness - reported [facility]   . Statins Other (See Comments)    Myalgia, weakness - reported [facility] 05/13/[facility]  . Tramadol Other (See Comments)    Nervous - reported by [facility] 05/13/14   . Valsartan Other (See Comments)    Dizziness - reported by [facility] 05/13/14   . Hydrocodone-Acetaminophen Nausea And Vomiting    reported by [facility] 05/13[facility]     Patient Measurements: Height: 5' (152.4 cm) Weight: 183 lb 14.4 oz (83.4 kg) IBW/kg (Calculated) : 45.5  Vital Signs: Temp: 97.7 F (36.5 C) (05/08 1150) Temp Source: Oral (05/08 1150) BP: 148/76 (05/08 1150) Pulse Rate: 62 (05/08 1150)  Labs: Recent Labs    06/11/18 0918 06/11/18 0929 06/12/18 0542 06/12/18 2137 06/13/18 0717 06/13/18 1807  HGB 13.7  --  12.6  --  13.1  --   HCT 44.3  --  40.2  --  41.5  --   PLT 149*  --  146*  --  149*  --   APTT 63*  --  63* 114* 170* 63*  LABPROT 23.2*  --  23.9*  --   --   --   INR 2.1*  --  2.2*  --   --   --   HEPARINUNFRC  --   --   --   --  >2.20*  --   CREATININE 1.15* 1.20* 1.14*  --  0.93  --     Estimated Creatinine Clearance: 43.1 mL/min (by C-G formula based on SCr of 0.93 mg/dL).  Assessment: 85 yo F with left sided weakness, facial droop, slurred speech on apixaban PTA for AFib switched to heparin for possible procedures.  aptt slightly low at 63   Goal of Therapy:  Heparin level 0.3-0.7 units/ml aPTT 66 - 102 seconds Monitor platelets by anticoagulation protocol: Yes   Plan:  Increase heparin to 500 units/hr Off at midnight for surgery Will need to follow up post-op [facility] plans  Isaac Bliss, PharmD, BCPS, BCCCP Clinical Pharmacist [phone]  Please check AMION for all [facility] Pharmacy numbers  06/13/2018 7:13 PM

## 2018-06-13 NOTE — Progress Notes (Signed)
Pt HR has dropped to the mid 30s twice. Provider paged. Will continue to monitor pt.

## 2018-06-13 NOTE — Progress Notes (Signed)
Patient APTT is 170 this am per Lab, pharmacy notified, pharmacy instruct to turn heparin drip off, drip off at 0910. Will continue to monitor the patient

## 2018-06-14 ENCOUNTER — Encounter (HOSPITAL_COMMUNITY)
Admission: EM | Disposition: A | Discharge status: Skilled Nursing Facility | Payer: Self-pay | Source: Skilled Nursing Facility | Attending: Internal Medicine

## 2018-06-14 ENCOUNTER — Encounter (HOSPITAL_COMMUNITY): Payer: Self-pay | Admitting: Critical Care Medicine

## 2018-06-14 ENCOUNTER — Inpatient Hospital Stay (HOSPITAL_COMMUNITY): Payer: Medicare Other

## 2018-06-14 ENCOUNTER — Other Ambulatory Visit (HOSPITAL_COMMUNITY): Payer: Medicare Other

## 2018-06-14 DIAGNOSIS — G9341 Metabolic encephalopathy: Secondary | ICD-10-CM

## 2018-06-14 DIAGNOSIS — Z008 Encounter for other general examination: Secondary | ICD-10-CM

## 2018-06-14 DIAGNOSIS — I482 Chronic atrial fibrillation, unspecified: Secondary | ICD-10-CM

## 2018-06-14 DIAGNOSIS — R001 Bradycardia, unspecified: Secondary | ICD-10-CM

## 2018-06-14 DIAGNOSIS — E876 Hypokalemia: Secondary | ICD-10-CM

## 2018-06-14 LAB — CBC
HCT: 39.5 % (ref 36.0–46.0)
Hemoglobin: 12.5 g/dL (ref 12.0–15.0)
MCH: 25.8 pg — ABNORMAL LOW (ref 26.0–34.0)
MCHC: 31.6 g/dL (ref 30.0–36.0)
MCV: 81.6 fL (ref 80.0–100.0)
Platelets: 149 10*3/uL — ABNORMAL LOW (ref 150–400)
RBC: 4.84 MIL/uL (ref 3.87–5.11)
RDW: 17.3 % — ABNORMAL HIGH (ref 11.5–15.5)
WBC: 6.4 10*3/uL (ref 4.0–10.5)
nRBC: 0 % (ref 0.0–0.2)

## 2018-06-14 LAB — BLOOD GAS, ARTERIAL
Acid-base deficit: 1.2 mmol/L (ref 0.0–2.0)
Bicarbonate: 22.2 mmol/L (ref 20.0–28.0)
Drawn by: 358491
FIO2: 21
O2 Saturation: 95.6 %
Patient temperature: 98.6
pCO2 arterial: 31.9 mmHg — ABNORMAL LOW (ref 32.0–48.0)
pH, Arterial: 7.456 — ABNORMAL HIGH (ref 7.350–7.450)
pO2, Arterial: 76.9 mmHg — ABNORMAL LOW (ref 83.0–108.0)

## 2018-06-14 LAB — CULTURE, BLOOD (ROUTINE X 2): Special Requests: ADEQUATE

## 2018-06-14 LAB — BASIC METABOLIC PANEL
Anion gap: 12 (ref 5–15)
BUN: 19 mg/dL (ref 8–23)
CO2: 24 mmol/L (ref 22–32)
Calcium: 8.8 mg/dL — ABNORMAL LOW (ref 8.9–10.3)
Chloride: 110 mmol/L (ref 98–111)
Creatinine, Ser: 0.76 mg/dL (ref 0.44–1.00)
GFR calc Af Amer: >60 mL/min (ref >60)
GFR calc non Af Amer: >60 mL/min (ref >60)
Glucose, Bld: 76 mg/dL (ref 70–99)
Potassium: 3.6 mmol/L (ref 3.5–5.1)
Sodium: 146 mmol/L — ABNORMAL HIGH (ref 135–145)

## 2018-06-14 LAB — GLUCOSE, CAPILLARY
Glucose-Capillary: 88 mg/dL (ref 70–99)
Glucose-Capillary: 89 mg/dL (ref 70–99)

## 2018-06-14 LAB — MAGNESIUM: Magnesium: 2 mg/dL (ref 1.7–2.4)

## 2018-06-14 LAB — APTT
aPTT: 43 seconds — ABNORMAL HIGH (ref 24–36)
aPTT: 49 seconds — ABNORMAL HIGH (ref 24–36)

## 2018-06-14 LAB — HEPARIN LEVEL (UNFRACTIONATED)
Heparin Unfractionated: >2.2 IU/mL — ABNORMAL HIGH (ref 0.30–0.70)
Heparin Unfractionated: 1.76 IU/mL — ABNORMAL HIGH (ref 0.30–0.70)

## 2018-06-14 LAB — SURGICAL PCR SCREEN
MRSA, PCR: POSITIVE — AB
Staphylococcus aureus: POSITIVE — AB

## 2018-06-14 SURGERY — INCISION AND DRAINAGE, ABSCESS
Anesthesia: Choice | Laterality: Left

## 2018-06-14 MED ORDER — FENTANYL CITRATE (PF) 250 MCG/5ML IJ SOLN
INTRAMUSCULAR | Status: AC
  Filled 2018-06-14: qty 5

## 2018-06-14 MED ORDER — POTASSIUM CHLORIDE CRYS ER 20 MEQ PO TBCR
40.0000 meq | EXTENDED_RELEASE_TABLET | Freq: Once | ORAL | Status: AC
  Administered 2018-06-14: 40 meq via ORAL
  Filled 2018-06-14: qty 2

## 2018-06-14 MED ORDER — ATROPINE SULFATE 1 MG/10ML IJ SOSY
0.5000 mg | PREFILLED_SYRINGE | INTRAMUSCULAR | Status: DC | PRN
  Administered 2018-06-14 – 2018-06-19 (×3): 0.5 mg via INTRAVENOUS
  Filled 2018-06-14: qty 10

## 2018-06-14 MED ORDER — MAGNESIUM SULFATE 2 GM/50ML IV SOLN
2.0000 g | Freq: Once | INTRAVENOUS | Status: AC
  Administered 2018-06-14: 2 g via INTRAVENOUS
  Filled 2018-06-14: qty 50

## 2018-06-14 MED ORDER — DEXTROSE 5 % IV SOLN
INTRAVENOUS | Status: DC
  Administered 2018-06-14 – 2018-06-20 (×8): via INTRAVENOUS

## 2018-06-14 MED ORDER — BISACODYL 5 MG PO TBEC
5.0000 mg | DELAYED_RELEASE_TABLET | Freq: Once | ORAL | Status: AC
  Administered 2018-06-14: 15:00:00 5 mg via ORAL
  Filled 2018-06-14: qty 1

## 2018-06-14 MED ORDER — HEPARIN (PORCINE) 25000 UT/250ML-% IV SOLN
900.0000 [IU]/h | INTRAVENOUS | Status: DC
  Administered 2018-06-14: 500 [IU]/h via INTRAVENOUS
  Administered 2018-06-16: 900 [IU]/h via INTRAVENOUS
  Filled 2018-06-14: qty 250

## 2018-06-14 MED ORDER — ATROPINE SULFATE 1 MG/10ML IJ SOSY
PREFILLED_SYRINGE | INTRAMUSCULAR | Status: AC
  Filled 2018-06-14: qty 10

## 2018-06-14 NOTE — Progress Notes (Signed)
Pt had BM x 3 times today ( waterly large BM). Changed dressing on Lt. Thigh. Please hold stool softer.  HS [employer]

## 2018-06-14 NOTE — Progress Notes (Signed)
ANTICOAGULATION CONSULT NOTE - Follow-Up Consult  Pharmacy Consult for heparin Indication: atrial fibrillation (Eliquis on hold)  Allergies  Allergen Reactions  . Zanaflex [Tizanidine Hcl] Shortness Of Breath, Other (See Comments) and Cough    De[facility] - [facility] 10/07/13  . Alprazolam Other (See Comments)    Dry mouth - reported by [facility] 05/13/14   . Carisoprodol Other (See Comments)    Dizziness - reported by [facility] 05/13/14   . Chlorthalidone Other (See Comments)    syncope  . Colesevelam Other (See Comments)    GI upset and stool change - reported by [facility] 05/13/14   . Dilaudid [Hydromorphone Hcl] Hives and Swelling  . Duloxetine Other (See Comments)    Dizziness - reported by [facility] 05/13/14   . Felodipine Other (See Comments)    Dizziness, groin pain - reported by [facility] 05/13/14   . Fosamax [Alendronate Sodium] Nausea And Vomiting and Other (See Comments)    Back and hip pain  . Furosemide Other (See Comments)    Dry mouth/dizziness - reported by [facility] 05/13/14   . Gabapentin Other (See Comments)    Insomnia - reported by [facility] 05/13/14   . Ibuprofen Other (See Comments)    GI pain - reported by [facility] 05/13/14   . Lovastatin Other (See Comments)    Weakness - reported by [facility] 05/13/14   . Meclizine Other (See Comments)    Sleeplessness - reported [facility]   . Meperidine Other (See Comments)    Jitteriness - reported by [facility] 05/13/[facility]   . Methadone Other (See Comments)    Stool impaction - reported by [facility] 05/13/14   . Morphine Other (See Comments)    Dizziness - reported by [facility] 05/13/14   . Niacin Other (See Comments)    Flushing - reported by [facility] 05/13/14   . Omeprazole Other (See Comments)    Sleeplessness - reported by [facility] 05/13/14   . Oxycodone Other (See Comments)    Groin pain - reported by  [facility] 05/[facility]   . Pravastatin Other (See Comments)    Weakness - reported by [facility] 05/13/14   . Simvastatin Other (See Comments)    Weakness - reported by [facility] 05/13/14   . Statins Other (See Comments)    Myalgia, weakness - reported by [facility] 05/13/14  . Tramadol Other (See Comments)    Nervous - reported by [facility] 05/13/14   . Valsartan Other (See Comments)    Dizziness - reported by [facility] 05/13/14   . Hydrocodone-Acetaminophen Nausea And Vomiting    reported by [facility] 05/13/14     Patient Measurements: Height: 5' (152.4 cm) Weight: 188 lb 0.8 oz (85.3 kg) IBW/kg (Calculated) : 45.5  Vital Signs: Temp: 97.7 F (36.5 C) (05/09 0407) Temp Source: Oral (05/09 0407) BP: 156/82 (05/09 0407) Pulse Rate: 61 (05/09 0407)  Labs: Recent Labs    06/12/18 0542  06/13/18 0717 06/13/18 1807 06/14/18 0217  HGB 12.6  --  13.1  --  12.5  HCT 40.2  --  41.5  --  39.5  PLT 146*  --  149*  --  149*  APTT 63*   < > 170* 63* 43*  LABPROT 23.9*  --   --   --   --  INR 2.2*  --   --   --   --   HEPARINUNFRC  --   --  >2.20*  --  >2.20*  CREATININE 1.14*  --  0.93  --  0.76   < > = values in this interval not displayed.    Estimated Creatinine Clearance: 50.7 mL/min (by C-G formula based on SCr of 0.76 mg/dL).  Assessment: 85 yo F with left sided weakness, facial droop, slurred speech on apixaban PTA for AFib switched to heparin. Patient has left thigh abscess with planned incision and drainage 5/9. However, due to patient's low heart rate, surgery has been delayed and will reevaluate 5/10. Heparin was held 5/9 at 0000.   Hemoglobin 12.5, plt 149. No bleeding noted and confirmed with nursing.  Per Dr. Luisa Hart heparin will need to be held 6 hour before I&D procedure, not rescheduled yet.    Goal of Therapy:  Heparin level 0.3-0.7 units/ml aPTT 66 - 102 seconds Monitor platelets by anticoagulation protocol: Yes   Plan:   Resume heparin at 500 units/hr Confirmatory 8hr anti-Xa level and aPTT  Daily CBC, anti-Xa, aPTT while on heparin  F/u plan for I&D F/u plans for resumption of apixaban after procedures  Foye Deer D PGY1 Pharmacy Resident  Phone [phone] Please use AMION for clinical pharmacists numbers  06/14/2018      10:18 AM

## 2018-06-14 NOTE — Progress Notes (Signed)
Pt with HR in the 30 's Will cancel I/D  for today until this is addressed  Discussed with Ms Roger Shelter on the phone this am

## 2018-06-14 NOTE — Progress Notes (Signed)
Pt's HR is staying on between 30's to 40's. Frequently down to 20's, but went back up to 30's. Pt's resting on the bed, open her eyes with touch & voice. Continue to monitor, but only give atropine with symptoms. HS [employer]

## 2018-06-14 NOTE — Plan of Care (Signed)
  Problem: Health Behavior/Discharge Planning: Goal: Ability to manage health-related needs will improve Outcome: Progressing   Problem: Clinical Measurements: Goal: Ability to maintain clinical measurements within normal limits will improve Outcome: Progressing Goal: Diagnostic test results will improve Outcome: Progressing Goal: Respiratory complications will improve Outcome: Progressing   Problem: Activity: Goal: Risk for activity intolerance will decrease Outcome: Progressing   Problem: Nutrition: Goal: Adequate nutrition will be maintained Outcome: Progressing   Problem: Coping: Goal: Level of anxiety will decrease Outcome: Progressing   Problem: Pain Managment: Goal: General experience of comfort will improve Outcome: Progressing   Problem: Safety: Goal: Ability to remain free from injury will improve Outcome: Progressing   Problem: Skin Integrity: Goal: Risk for impaired skin integrity will decrease Outcome: Progressing   

## 2018-06-14 NOTE — Progress Notes (Signed)
ANTICOAGULATION CONSULT NOTE - Follow-Up Consult  Pharmacy Consult for heparin Indication: atrial fibrillation (Eliquis on hold)  Allergies  Allergen Reactions  . Zanaflex [Tizanidine Hcl] Shortness Of Breath, Other (See Comments) and Cough    [facility] - [facility]/07/13  . Alprazolam Other (See Comments)    Dry mouth - reported by [facility] 05/13/14   . Carisoprodol Other (See Comments)    Dizziness - reported by [facility]/13/[facility]   . Chlorthalidone Other (See Comments)    syncope  . Colesevelam Other (See Comments)    GI upset and stool change - reported by [facility]   . Dilaudid [Hydromorphone Hcl] Hives and Swelling  . Duloxetine Other (See Comments)    Dizziness - reported by [facility] 05/13/14   . Felodipine Other (See Comments)    Dizziness, groin pain - reported by [facility] 05/13/14   . Fosamax [Alendronate Sodium] Nausea And Vomiting and Other (See Comments)    Back and hip pain  . Furosemide Other (See Comments)    Dry mouth/dizziness - reported by [facility] 05/13/[facility]   . Gabapentin Other (See Comments)    Insomnia - reported by [facility] 05/13/14   . Ibuprofen Other (See Comments)    GI pain - reported by [facility] 05/13/14   . Lovastatin Other (See Comments)    Weakness - reported by [facility] 05/13/[facility]   . Meclizine Other (See Comments)    Sleeplessness - reported by [facility] 05/13/14   . Meperidine Other (See Comments)    Jitteriness - reported by [facility] 05/13/14   . Methadone Other (See Comments)    Stool impaction - reported by [facility] 05/13/14   . Morphine Other (See Comments)    Dizziness - reported by [facility] 05/13/14   . Niacin Other (See Comments)    Flushing - reported [facility] 05/13/[facility]   . Omeprazole Other (See Comments)    Sleeplessness - reported by [facility] 05/13/14   . Oxycodone Other (See Comments)    Groin pain - reported by  [facility] 05/13/14   . Pravastatin Other (See Comments)    Weakness - reported by [facility] 05/13/14   . Simvastatin Other (See Comments)    Weakness - reported by [facility] 05/13/14   . Statins Other (See Comments)    Myalgia, weakness - reported by [facility] 05/13/14  . Tramadol Other (See Comments)    Nervous - reported by [facility] 05/13/[facility]   . Valsartan Other (See Comments)    Dizziness - reported by [facility] 05/13/14   . Hydrocodone-Acetaminophen Nausea And Vomiting    reported by [facility] 05/13/14     Patient Measurements: Height: 5' (152.4 cm) Weight: 188 lb 0.8 oz (85.3 kg) IBW/kg (Calculated) : 45.5  Vital Signs: Temp: 97 F (36.1 C) (05/09 1934) Temp Source: Oral (05/09 1149) BP: 138/50 (05/09 1934) Pulse Rate: 39 (05/09 1934)  Labs: Recent Labs    06/12/18 0542  06/13/18 0717 06/13/18 1807 06/14/18 0217 06/14/18 1838  HGB 12.6  --  13.1  --  12.5  --   HCT 40.2  --  41.5  --  39.5  --   PLT 146*  --  149*  --  149*  --   APTT 63*   < >  170* 63* 43* 49*  LABPROT 23.9*  --   --   --   --   --   INR 2.2*  --   --   --   --   --   HEPARINUNFRC  --   --  >2.20*  --  >2.20* 1.76*  CREATININE 1.14*  --  0.93  --  0.76  --    < > = values in this interval not displayed.    Estimated Creatinine Clearance: 50.7 mL/min (by C-G formula based on SCr of 0.76 mg/dL).  Assessment: 85 yo F with left sided weakness, facial droop, slurred speech on apixaban PTA for AFib switched to heparin. Patient has left thigh abscess with planned incision and drainage 5/9. However, due to patient's low heart rate, surgery has been delayed and will reevaluate 5/10. Heparin was held 5/9 at 0000.   Hemoglobin 12.5, plt 149. No bleeding noted and confirmed with nursing.  Per Dr. Luisa Hart heparin will need to be held 6 hour before I&D procedure, not rescheduled yet.   PM PTT low at 49   Goal of Therapy:  Heparin level 0.3-0.7 units/ml aPTT 66 - 102  seconds Monitor platelets by anticoagulation protocol: Yes   Plan:  Heparin to 700 units / hr Follow up AM labs F/u plan for I&D F/u plans for resumption of apixaban after procedures  Thank you Okey Regal, PharmD  Please use AMION for clinical pharmacists numbers  06/14/2018      7:41 PM

## 2018-06-14 NOTE — Progress Notes (Addendum)
11 beats of VT at 1427 and 16 beats of Wide QRS at 1434. Pt was asymptomatic, alert and answer the questions. Pt refused to have lunch tray, but she was drinking Ensure. Notified cardiology team regarding this matter. Cardiology team continuously monitor pt's condition, no future treatment at this time. May give Mg+ and potassium. HS [employer]

## 2018-06-14 NOTE — Progress Notes (Signed)
PROGRESS NOTE    Margaret Collier  WUJ:[id number] DOB: 31-Aug-1931 DOA: 06/11/2018 PCP: System, Pcp Not In    Brief Narrative 85 year old with past medical history significant for hypertension, hyperlipidemia, chronic A. fib on Eliquis oxygen dependent at night 2 L, CVA with residual left-sided weakness, OSA who presents complaining with worsening confusion and a speech problem.  History has been limited to get from the patient due to history of dementia and report of increased; confusion. Patient is bedbound at baseline, at facility they use a Hoyer lift to get her up in the chair. Evaluation and admission: A CT head showed areas of previous stroke.  CT scan left femur showed 3.5 cm abscess left mid thigh. Patient also was noted to be bradycardic with heart rate in the 40s.  Patient was refusing care, and surgery. She has been confused.  She was evaluated by Psychiatrist, who recommended that patient lack capacity to consent for surgery or refuse medical care. Surgery was consulted, patient will have I and D done tomorrow.   Blood culture 1 out 4 bottle grew Staph coagulase negative. Probably a contaminant.   Assessment & Plan:   Principal Problem:   Evaluation by psychiatric service required Active Problems:   Atrial fibrillation (HCC)   OSA (obstructive sleep apnea)   History of CVA with residual deficit   Bradycardia   Cellulitis and abscess of left leg   AKI (acute kidney injury) (HCC)   Thrombocytopenia (HCC)  1-Left thigh abscess; cellulitis Patient present with confusion, leukocytosis, worsening redness of left lower extremity.  She failed oral antibiotic. -CT showed focal area of abscess. -Surgery has been consulted and is recommending IV antibiotics for 48 hours and patient might require I and D. -Patient lack capacity to consent for surgery or refusing medical care.  -surgery  Cancel today due to decreased hr overnight. Patient hr in the 30. Evaluated by cardiology, patient  does not need pacemaker and pacemaker is contraindicated in setting of active infection. See cardiology rec regarding surgical risk. Discussed with surgery, patient is too high risk due to bradycardia, anesthesia wouldn't proceed with surgery as well. We can ask IR to evaluate patient for US aspiration.  IR consulted.  -Continue with IV antibiotics.  -will consult palliative care as well for goals of care.   Blood culture positive 1--4 bottle. Discussed with ID, Dr Luciana Axe probably a contaminant. No need to get 2 ECHO. He agree with repeating blood cultures.  Staph, coagulase negative.  On IV vancomycin. Repeated blood cultures 5-08; pending.   2-Acute metabolic encephalopathy; She  was transferred from her facility with increased confusion and questionable worsening left side weakness. CT head was negative. She Is alert and oriented x3.  She has been refusing care.  Psychiatric consulted for evaluation for capacity, patient lacks capacity to consent for surgery or refusing medical care.   3-Bradycardia, asymptomatic Per cardiology  no current indication for pacer at this time.  Also patient currently with an infection. Not a good  Candidate for pacer at this pint in time due to infection.  5-09; patient with episodes of bradycardia overnight, and pauses. Cardiology consulted again. See Dr Royann Shivers recommendations. No need for pacemaker at this time and implementing pacemaker with active infection is contraindicated.   4-permanent A. fib on Eliquis: Eliquis on hold due to possible surgical procedure. Cardiology  recommending heparin.  Continue with heparin per pharmacy  5-History of dementia 6-mild chronic thrombocytopenia  7-AKI: Prior creatinine around 0.8.  Suspect prerenal.  Continue  with IV fluids.  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  9-Constipation;  MiraLAX.  10-chronic diastolic heart failure: Holding Lasix due  to AKI and infection. 11-history of CVA with residual left-sided weakness, neuropathy: Repeated CT head negative. 12-OSA: Continue with 2 L of oxygen at night.  Estimated body mass index is 36.73 kg/m as calculated from the following:   Height as of this encounter: 5' (1.524 m).   Weight as of this encounter: 85.3 kg.   DVT prophylaxis: Was on Eliquis prior to admission, currently on hold due to possible surgical procedure Code Status: Full code Family Communication;  Niece updated. I recommended palliative consultation for goals of care, and discussion of code status.  Disposition Plan: Patient needs to remain in the hospital for IV antibiotics, she failed oral antibiotic for left thigh abscess.  Continue with antibiotics.   Consultants:   Cardiology  Surgery    Procedures:   none   Antimicrobials:   Ancef 5-06  Vancomycin 5-06   Subjective: Patient is alert, she appears depressed today, denies chest pain, dyspnea, she was able o tell me her niece name. She does not have appetite. No eating.    Objective: Vitals:   06/14/18 0033 06/14/18 0100 06/14/18 0223 06/14/18 0407  BP: 126/67   (!) 156/82  Pulse: (!) 50  (!) 56 61  Resp: 18   16  Temp:    97.7 F (36.5 C)  TempSrc:    Oral  SpO2: 98%   100%  Weight:  85.3 kg    Height:        Intake/Output Summary (Last 24 hours) at 06/14/2018 0950 Last data filed at 06/14/2018 0859 Gross per 24 hour  Intake 103 ml  Output 300 ml  Net -197 ml   Filed Weights   06/12/18 0541 06/13/18 0410 06/14/18 0100  Weight: 82.2 kg 83.4 kg 85.3 kg    Examination:  General exam: NAD Respiratory system: CTA Cardiovascular system: S 1, S 2 RRR Gastrointestinal system: BS present, soft, nt Central nervous system: chronic left side weakness, left arm contraction.  Extremities: trace edema. Right leg with skin tear, bloody, cover with dressing Skin: redness and edema left thigh persist.     Data Reviewed: I have personally  reviewed following labs and imaging studies  CBC: Recent Labs  Lab 06/11/18 0918 06/12/18 0542 06/13/18 0717 06/14/18 0217  WBC 11.0* 13.6* 8.4 6.4  NEUTROABS 7.8*  --   --   --   HGB 13.7 12.6 13.1 12.5  HCT 44.3 40.2 41.5 39.5  MCV 83.4 82.2 81.9 81.6  PLT 149* 146* 149* 149*   Basic Metabolic Panel: Recent Labs  Lab 06/11/18 0918 06/11/18 0929 06/12/18 0542 06/13/18 0717 06/14/18 0217 06/14/18 0223  NA 141  --  141 145 146*  --   K 4.2  --  4.3 4.0 3.6  --   CL 101  --  105 107 110  --   CO2 28  --  25 22 24   --   GLUCOSE 77  --  90 81 76  --   BUN 46*  --  37* 27* 19  --   CREATININE 1.15* 1.20* 1.14* 0.93 0.76  --   CALCIUM 9.7  --  9.0 9.2 8.8*  --   MG  --   --  2.1  --   --  2.0   GFR: Estimated Creatinine Clearance: 50.7 mL/min (by  C-G formula based on SCr of 0.76 mg/dL). Liver Function Tests: Recent Labs  Lab 06/11/18 0918  AST 31  ALT 22  ALKPHOS 128*  BILITOT 0.9  PROT 7.1  ALBUMIN 3.3*   No results for input(s): LIPASE, AMYLASE in the last 168 hours. No results for input(s): AMMONIA in the last 168 hours. Coagulation Profile: Recent Labs  Lab 06/11/18 0918 06/12/18 0542  INR 2.1* 2.2*   Cardiac Enzymes: No results for input(s): CKTOTAL, CKMB, CKMBINDEX, TROPONINI in the last 168 hours. BNP (last 3 results) No results for input(s): PROBNP in the last 8760 hours. HbA1C: No results for input(s): HGBA1C in the last 72 hours. CBG: Recent Labs  Lab 06/14/18 0541  GLUCAP 88   Lipid Profile: No results for input(s): CHOL, HDL, LDLCALC, TRIG, CHOLHDL, LDLDIRECT in the last 72 hours. Thyroid Function Tests: No results for input(s): TSH, T4TOTAL, FREET4, T3FREE, THYROIDAB in the last 72 hours. Anemia Panel: No results for input(s): VITAMINB12, FOLATE, FERRITIN, TIBC, IRON, RETICCTPCT in the last 72 hours. Sepsis Labs: Recent Labs  Lab 06/11/18 8413 06/11/18 1113  LATICACIDVEN 0.9 1.0    Recent Results (from the past 240 hour(s))   Culture, blood (routine x 2)     Status: Abnormal (Preliminary result)   Collection Time: 06/11/18  9:18 AM  Result Value Ref Range Status   Specimen Description BLOOD RIGHT ANTECUBITAL  Final   Special Requests   Final    BOTTLES DRAWN AEROBIC AND ANAEROBIC Blood Culture adequate volume   Culture  Setup Time   Final    GRAM POSITIVE COCCI ANAEROBIC BOTTLE ONLY CRITICAL RESULT CALLED TO, READ BACK BY AND VERIFIED WITH: R. FANNING PHARMD, AT 1334 06/12/18 BY D. VANHOOK    Culture (A)  Final    STAPHYLOCOCCUS SPECIES (COAGULASE NEGATIVE) THE SIGNIFICANCE OF ISOLATING THIS ORGANISM FROM A SINGLE SET OF BLOOD CULTURES WHEN MULTIPLE SETS ARE DRAWN IS UNCERTAIN. PLEASE NOTIFY THE MICROBIOLOGY DEPARTMENT WITHIN ONE WEEK IF SPECIATION AND SENSITIVITIES ARE REQUIRED. Performed at [facility], [address] [place], [place] [address]    Report Status PENDING  Incomplete  Culture, blood (routine x 2)     Status: None (Preliminary result)   Collection Time: 06/11/18  9:18 AM  Result Value Ref Range Status   Specimen Description BLOOD BLOOD RIGHT FOREARM  Final   Special Requests   Final    BOTTLES DRAWN AEROBIC AND ANAEROBIC Blood Culture adequate volume   Culture   Final    NO GROWTH 2 DAYS Performed at [facility], [address], [place], [place] [address]    Report Status PENDING  Incomplete  SARS Coronavirus 2 (CEPHEID - Performed in [facility] lab), Hosp Order     Status: None   Collection Time: 06/11/18  9:18 AM  Result Value Ref Range Status   SARS Coronavirus 2 NEGATIVE NEGATIVE Final    Comment: (NOTE) If result is NEGATIVE SARS-CoV-2 target nucleic acids are NOT DETECTED. The SARS-CoV-2 RNA is generally detectable in upper and lower  respiratory specimens during the acute phase of infection. The lowest  concentration of SARS-CoV-2 viral copies this assay can detect is 250  copies / mL. A negative result does not preclude SARS-CoV-2 infection  and should  not be used as the sole basis for treatment or other  patient management decisions.  A negative result may occur with  improper specimen collection / handling, submission of specimen other  than nasopharyngeal swab, presence of  viral mutation(s) within the  areas targeted by this assay, and inadequate number of viral copies  (<250 copies / mL). A negative result must be combined with clinical  observations, patient history, and epidemiological information. If result is POSITIVE SARS-CoV-2 target nucleic acids are DETECTED. The SARS-CoV-2 RNA is generally detectable in upper and lower  respiratory specimens dur ing the acute phase of infection.  Positive  results are indicative of active infection with SARS-CoV-2.  Clinical  correlation with patient history and other diagnostic information is  necessary to determine patient infection status.  Positive results do  not rule out bacterial infection or co-infection with other viruses. If result is PRESUMPTIVE POSTIVE SARS-CoV-2 nucleic acids MAY BE PRESENT.   A presumptive positive result was obtained on the submitted specimen  and confirmed on repeat testing.  While 2019 novel coronavirus  (SARS-CoV-2) nucleic acids may be present in the submitted sample  additional confirmatory testing may be necessary for epidemiological  and / or clinical management purposes  to differentiate between  SARS-CoV-2 and other Sarbecovirus currently known to infect humans.  If clinically indicated additional testing with an alternate test  methodology [phone]) is advised. The SARS-CoV-2 RNA is generally  detectable in upper and lower respiratory sp ecimens during the acute  phase of infection. The expected result is Negative. Fact Sheet for Patients:  BoilerBrush.com.cy Fact Sheet for Healthcare Providers: https://pope.com/ This test is not yet approved or cleared by the [place] FDA and has been  authorized for detection and/or diagnosis of SARS-CoV-2 by FDA under an Emergency Use Authorization (EUA).  This EUA will remain in effect (meaning this test can be used) for the duration of the COVID-19 declaration under Section 564(b)(1) of the Act, 21 U.S.C. section 360bbb-3(b)(1), unless the authorization is terminated or revoked sooner. Performed at [facility], [address] [place], [place] [address]   Blood Culture ID Panel (Reflexed)     Status: Abnormal   Collection Time: 06/11/18  9:18 AM  Result Value Ref Range Status   Enterococcus species NOT DETECTED NOT DETECTED Final   Listeria monocytogenes NOT DETECTED NOT DETECTED Final   Staphylococcus species DETECTED (A) NOT DETECTED Final    Comment: Methicillin (oxacillin) susceptible coagulase negative staphylococcus. Possible blood culture contaminant (unless isolated from more than one blood culture draw or clinical case suggests pathogenicity). No antibiotic treatment is indicated for blood  culture contaminants. CRITICAL RESULT CALLED TO, READ BACK BY AND VERIFIED WITH: R. FANNING PHARMD, AT 1334 06/12/18 BY D. VANHOOK    Staphylococcus aureus (BCID) NOT DETECTED NOT DETECTED Final   Methicillin resistance NOT DETECTED NOT DETECTED Final   Streptococcus species NOT DETECTED NOT DETECTED Final   Streptococcus agalactiae NOT DETECTED NOT DETECTED Final   Streptococcus pneumoniae NOT DETECTED NOT DETECTED Final   Streptococcus pyogenes NOT DETECTED NOT DETECTED Final   Acinetobacter baumannii NOT DETECTED NOT DETECTED Final   Enterobacteriaceae species NOT DETECTED NOT DETECTED Final   Enterobacter cloacae complex NOT DETECTED NOT DETECTED Final   Escherichia coli NOT DETECTED NOT DETECTED Final   Klebsiella oxytoca NOT DETECTED NOT DETECTED Final   Klebsiella pneumoniae NOT DETECTED NOT DETECTED Final   Proteus species NOT DETECTED NOT DETECTED Final   Serratia marcescens NOT DETECTED NOT DETECTED Final    Haemophilus influenzae NOT DETECTED NOT DETECTED Final   Neisseria meningitidis NOT DETECTED NOT DETECTED Final   Pseudomonas aeruginosa NOT DETECTED NOT DETECTED Final   Candida albicans NOT DETECTED NOT DETECTED Final  Candida glabrata NOT DETECTED NOT DETECTED Final   Candida krusei NOT DETECTED NOT DETECTED Final   Candida parapsilosis NOT DETECTED NOT DETECTED Final   Candida tropicalis NOT DETECTED NOT DETECTED Final    Comment: Performed at [facility], [address], [place], [place] [address]  MRSA PCR Screening     Status: Abnormal   Collection Time: 06/11/18 11:55 PM  Result Value Ref Range Status   MRSA by PCR POSITIVE (A) NEGATIVE Final    Comment:        The GeneXpert MRSA Assay (FDA approved for NASAL specimens only), is one component of a comprehensive MRSA colonization surveillance program. It is not intended to diagnose MRSA infection nor to guide or monitor treatment for MRSA infections. RESULT CALLED TO, READ BACK BY AND VERIFIED WITH: L. Sheliah Plane [id number] 06/12/2018 Girtha Hake Performed at [facility], [address], [place], [place] [address]   Surgical pcr screen     Status: Abnormal   Collection Time: 06/14/18  1:25 AM  Result Value Ref Range Status   MRSA, PCR POSITIVE (A) NEGATIVE Final    Comment: CRITICAL RESULT CALLED TO, READ BACK BY AND VERIFIED WITH: RN Lucrezia Europe [id number]  THANEY     Staphylococcus aureus POSITIVE (A) NEGATIVE Final    Comment: CRITICAL RESULT CALLED TO, READ BACK BY AND VERIFIED WITH: RN Lucrezia Europe [phone]  THANEY Performed at [facility], [address] [place], [place] [address]          Radiology Studies: No results found.      Scheduled Meds: . atorvastatin  40 mg Oral q1800  . atropine      . calcium carbonate  1 tablet Oral TID AC  . chlorhexidine  15 mL Mouth/Throat BID  . Chlorhexidine Gluconate Cloth  6 each Topical Q0600  . mupirocin ointment  1 application Nasal BID  .  pantoprazole  40 mg Oral Q0600  . polyethylene glycol  17 g Oral Daily  . pregabalin  25 mg Oral Q8H  . senna-docusate  1 tablet Oral BID  . sodium chloride flush  3 mL Intravenous Q12H   Continuous Infusions: . sodium chloride 250 mL (06/14/18 0402)  .  ceFAZolin (ANCEF) IV 1 g (06/14/18 0403)  . dextrose 50 mL/hr at 06/14/18 0754  . vancomycin 1,500 mg (06/13/18 2140)     LOS: 2 days    Time spent: 35 minutes    Alba Cory, MD [facility] Pager [phone]  If 7PM-7AM, please contact night-coverage [email or web address] Password [email or web address] 06/14/2018, 9:50 AM

## 2018-06-14 NOTE — Progress Notes (Signed)
Asked to reevaluate for bradycardia, episodes of pauses up to 3.5" overnight, rate in the 30s. She has a longstanding history of atrial fibrillation with slow ventricular response. Her bradycardia has not been symptomatic. She does not meet criteria for pacemaker implantation (symptoms or asymptomatic pauses >5"). She has declined any procedures, specifically surgery, but her ability to make informed decisions is questionable due to her impaired cognition. Implanting a pacemaker in a patient with an active soft tissue infection is contraindicated. She is not receiving any medications with negative chronotropic effect and these should be avoided in the future as well. The rhythm changes from last night do not change the overall assessment of her surgical risk. She is a high risk patient due to her overall poor functional status and advanced age, but should tolerate a low risk extra-abdominal/extra-thoracic surgical procedure with moderate chance of major CV complications.  Thurmon Fair, MD, [facility] [phone] office [phone] pager 06/14/2018 9:55 AM

## 2018-06-14 NOTE — Progress Notes (Signed)
Pt had a 3.29 pause. Provider notified.

## 2018-06-14 NOTE — Progress Notes (Addendum)
RT Note:  It was passed on report that patient has been refusing CPAP.  Patient is also having issues with AMS and according to order, is considered and aspiration risk.  Considering that she is AMS, a CPAP, despite patient refusal is usually not recommended.  Patient is currently is on 3L [place].  Sat is 100%.  Will continue to monitor patient.

## 2018-06-14 NOTE — Progress Notes (Signed)
OR RN called 3east RN to inform about pt being picked up at 0630. 3east RN informed OR RN the status of pt HR and pt continuously dropping into the 30s. Waiting back to hear from OR RN for further instructions. Will continue to monitor pt.

## 2018-06-14 NOTE — Progress Notes (Signed)
Pharmacy Antibiotic Note  Margaret Collier is a 85 y.o. female admitted on 06/11/2018 with concerns for cellulitis.  Pharmacy has been consulted for vancomycin dosing.   WBC down to 6.4, Scr improved to 0.76 (at baseline). Incision and drainage of the abscess was scheduled for today, however due to patient's low heart rate it has been postponed and surgery will reevaluate 5/10.  Initial calculation  Vancomycin 1500 mg IV Q 48 hrs. Goal AUC 400-550. Expected AUC: 492 SCr used: 1.2  Re-calculation  Vancomycin 750 mg IV Q 24 hrs. Goal AUC 400-550.  Expected AUC: 493 SCr used: 0.8  Plan: Continue vancomycin 1500 mg IV q48h. Monitor clinical status, cultures, renal function, and LOT Monitor vancomycin levels as needed  Height: 5' (152.4 cm) Weight: 188 lb 0.8 oz (85.3 kg) IBW/kg (Calculated) : 45.5  Temp (24hrs), Avg:97.7 F (36.5 C), Min:97.7 F (36.5 C), Max:97.8 F (36.6 C)  Recent Labs  Lab 06/11/18 0918 06/11/18 0929 06/11/18 1113 06/12/18 0542 06/13/18 0717 06/14/18 0217  WBC 11.0*  --   --  13.6* 8.4 6.4  CREATININE 1.15* 1.20*  --  1.14* 0.93 0.76  LATICACIDVEN 0.9  --  1.0  --   --   --     Estimated Creatinine Clearance: 50.7 mL/min (by C-G formula based on SCr of 0.76 mg/dL).    Allergies  Allergen Reactions  . Zanaflex [Tizanidine Hcl] Shortness Of Breath, Other (See Comments) and Cough    Dehydration - reported by [facility] 10/07/13  . Alprazolam Other (See Comments)    Dry mouth - reported by [facility] 05/13/14   . Carisoprodol Other (See Comments)    Dizziness - reported by [facility] 05/13/14   . Chlorthalidone Other (See Comments)    syncope  . Colesevelam Other (See Comments)    GI upset and stool change - reported by [facility] 05/13/14   . Dilaudid [Hydromorphone Hcl] Hives and Swelling  . Duloxetine Other (See Comments)    Dizziness - reported by [facility] 05/13/14   . Felodipine Other (See Comments)    Dizziness, groin  pain - reported by [facility] 05/13/14   . Fosamax [Alendronate Sodium] Nausea And Vomiting and Other (See Comments)    Back and hip pain  . Furosemide Other (See Comments)    Dry mouth/dizziness - reported by [facility] 05/13/14   . Gabapentin Other (See Comments)    Insomnia - reported by [facility] 05/13/14   . Ibuprofen Other (See Comments)    GI pain - reported by [facility] 05/13/14   . Lovastatin Other (See Comments)    Weakness - reported by [facility] 05/13/14   . Meclizine Other (See Comments)    Sleeplessness - reported by [facility] 05/13/14   . Meperidine Other (See Comments)    Jitteriness - reported by [facility] 05/13/14   . Methadone Other (See Comments)    Stool impaction - reported by [facility] 05/13/14   . Morphine Other (See Comments)    Dizziness - reported by [facility] 05/13/14   . Niacin Other (See Comments)    Flushing - reported by [facility] 05/13/14   . Omeprazole Other (See Comments)    Sleeplessness - reported by [facility] 05/13/14   . Oxycodone Other (See Comments)    Groin pain - reported by [facility] 05/13/14   . Pravastatin Other (  See Comments)    Weakness - reported by [facility] 05/13/14   . Simvastatin Other (See Comments)    Weakness - reported by [facility] 05/13/14   . Statins Other (See Comments)    Myalgia, weakness - reported by [facility] 05/13/14  . Tramadol Other (See Comments)    Nervous - reported by [facility] 05/13/14   . Valsartan Other (See Comments)    Dizziness - reported by [facility] 05/13/14   . Hydrocodone-Acetaminophen Nausea And Vomiting    reported by [facility] 05/13/14     Antimicrobials this admission: 5/6 Cefazolin >> 5/6 Vancomycin >> 5/4 Cephalexin >> 5/6  Dose adjustments this admission: n/a  Microbiology results: 5/6 BCx: coag neg staph - likely contaminant  5/6 COVID: negative  5/6,9 MRSA: +   Thank you for allowing  pharmacy to be a part of this patient's care.  Foye DeerAnna , Pharm D PGY1 Pharmacy Resident  Phone [phone] Please use AMION for clinical pharmacists numbers  06/14/2018      9:47 AM

## 2018-06-14 NOTE — Progress Notes (Signed)
RT attempted to obtain ABG. Patient refused multiple times stating "no more". RN notified. RT will continue to monitor.

## 2018-06-14 NOTE — Progress Notes (Signed)
Pt HR still dropping to the 30s and now pt has had a 2.8 second pause. Provider paged. Will continue to monitor pt.

## 2018-06-15 ENCOUNTER — Other Ambulatory Visit (HOSPITAL_COMMUNITY): Payer: Medicare Other

## 2018-06-15 DIAGNOSIS — L02416 Cutaneous abscess of left lower limb: Secondary | ICD-10-CM

## 2018-06-15 DIAGNOSIS — Z515 Encounter for palliative care: Secondary | ICD-10-CM

## 2018-06-15 DIAGNOSIS — G8194 Hemiplegia, unspecified affecting left nondominant side: Secondary | ICD-10-CM

## 2018-06-15 DIAGNOSIS — L03116 Cellulitis of left lower limb: Principal | ICD-10-CM

## 2018-06-15 DIAGNOSIS — I4821 Permanent atrial fibrillation: Secondary | ICD-10-CM

## 2018-06-15 DIAGNOSIS — R627 Adult failure to thrive: Secondary | ICD-10-CM

## 2018-06-15 DIAGNOSIS — J9611 Chronic respiratory failure with hypoxia: Secondary | ICD-10-CM

## 2018-06-15 DIAGNOSIS — I6930 Unspecified sequelae of cerebral infarction: Secondary | ICD-10-CM

## 2018-06-15 DIAGNOSIS — Z7189 Other specified counseling: Secondary | ICD-10-CM

## 2018-06-15 DIAGNOSIS — G4733 Obstructive sleep apnea (adult) (pediatric): Secondary | ICD-10-CM

## 2018-06-15 LAB — CBC
HCT: 38.5 % (ref 36.0–46.0)
Hemoglobin: 12 g/dL (ref 12.0–15.0)
MCH: 25.5 pg — ABNORMAL LOW (ref 26.0–34.0)
MCHC: 31.2 g/dL (ref 30.0–36.0)
MCV: 81.7 fL (ref 80.0–100.0)
Platelets: 146 10*3/uL — ABNORMAL LOW (ref 150–400)
RBC: 4.71 MIL/uL (ref 3.87–5.11)
RDW: 17.3 % — ABNORMAL HIGH (ref 11.5–15.5)
WBC: 8.8 10*3/uL (ref 4.0–10.5)
nRBC: 0 % (ref 0.0–0.2)

## 2018-06-15 LAB — BASIC METABOLIC PANEL
Anion gap: 12 (ref 5–15)
BUN: 17 mg/dL (ref 8–23)
CO2: 22 mmol/L (ref 22–32)
Calcium: 8.6 mg/dL — ABNORMAL LOW (ref 8.9–10.3)
Chloride: 106 mmol/L (ref 98–111)
Creatinine, Ser: 0.7 mg/dL (ref 0.44–1.00)
GFR calc Af Amer: >60 mL/min (ref >60)
GFR calc non Af Amer: >60 mL/min (ref >60)
Glucose, Bld: 121 mg/dL — ABNORMAL HIGH (ref 70–99)
Potassium: 3.8 mmol/L (ref 3.5–5.1)
Sodium: 140 mmol/L (ref 135–145)

## 2018-06-15 LAB — PROTIME-INR
INR: 1.3 — ABNORMAL HIGH (ref 0.8–1.2)
Prothrombin Time: 16.2 seconds — ABNORMAL HIGH (ref 11.4–15.2)

## 2018-06-15 LAB — GLUCOSE, CAPILLARY: Glucose-Capillary: 136 mg/dL — ABNORMAL HIGH (ref 70–99)

## 2018-06-15 LAB — HEPARIN LEVEL (UNFRACTIONATED): Heparin Unfractionated: 0.88 IU/mL — ABNORMAL HIGH (ref 0.30–0.70)

## 2018-06-15 LAB — APTT
aPTT: 50 seconds — ABNORMAL HIGH (ref 24–36)
aPTT: 51 seconds — ABNORMAL HIGH (ref 24–36)

## 2018-06-15 LAB — TSH: TSH: 6.66 u[IU]/mL — ABNORMAL HIGH (ref 0.350–4.500)

## 2018-06-15 LAB — T4, FREE: Free T4: 0.95 ng/dL (ref 0.82–1.77)

## 2018-06-15 MED ORDER — DOPAMINE-DEXTROSE 3.2-5 MG/ML-% IV SOLN
5.0000 ug/kg/min | INTRAVENOUS | Status: DC
  Administered 2018-06-15 – 2018-06-16 (×2): 5 ug/kg/min via INTRAVENOUS
  Filled 2018-06-15 (×2): qty 250

## 2018-06-15 NOTE — Progress Notes (Signed)
Patient is alert and talkative at times, HR in the 30s and 40s on dopamine drip flat rate. B/lp stable Nephew called for an update, plan plan to make a decision on biopsy tomorrow depending on HR Rate.

## 2018-06-15 NOTE — Progress Notes (Addendum)
ANTICOAGULATION CONSULT NOTE - Follow-Up Consult  Pharmacy Consult for heparin Indication: atrial fibrillation (Eliquis on hold)  Allergies  Allergen Reactions  . Zanaflex [Tizanidine Hcl] Shortness Of Breath, Other (See Comments) and Cough    Dehydration - reported by [facility] 10/07/13  . Alprazolam Other (See Comments)    Dry mouth - reported by [facility] 05/13/14   . Carisoprodol Other (See Comments)    Dizziness - reported by [facility] 05/13/14   . Chlorthalidone Other (See Comments)    syncope  . Colesevelam Other (See Comments)    GI upset and stool change - reported by [facility] 05/13/14   . Dilaudid [Hydromorphone Hcl] Hives and Swelling  . Duloxetine Other (See Comments)    Dizziness - reported by [facility] 05/13/14   . Felodipine Other (See Comments)    Dizziness, groin pain - reported by [facility] 05/13/14   . Fosamax [Alendronate Sodium] Nausea And Vomiting and Other (See Comments)    Back and hip pain  . Furosemide Other (See Comments)    Dry mouth/dizziness - reported by [facility] 05/13/14   . Gabapentin Other (See Comments)    Insomnia - reported by [facility] 05/13/14   . Ibuprofen Other (See Comments)    GI pain - reported by [facility] 05/13/14   . Lovastatin Other (See Comments)    Weakness - reported by [facility] 05/13/14   . Meclizine Other (See Comments)    Sleeplessness - reported by [facility] 05/13/14   . Meperidine Other (See Comments)    Jitteriness - reported by [facility] 05/13/14   . Methadone Other (See Comments)    Stool impaction - reported by [facility] 05/13/14   . Morphine Other (See Comments)    Dizziness - reported by [facility] 05/13/14   . Niacin Other (See Comments)    Flushing - reported by [facility] 05/13/14   . Omeprazole Other (See Comments)    Sleeplessness - reported by [facility] 05/13/14   . Oxycodone Other (See Comments)    Groin pain - reported by  [facility] 05/13/14   . Pravastatin Other (See Comments)    Weakness - reported by [facility] 05/13/14   . Simvastatin Other (See Comments)    Weakness - reported by [facility] 05/13/14   . Statins Other (See Comments)    Myalgia, weakness - reported by [facility] 05/13/14  . Tramadol Other (See Comments)    Nervous - reported by [facility] 05/13/14   . Valsartan Other (See Comments)    Dizziness - reported by [facility] 05/13/14   . Hydrocodone-Acetaminophen Nausea And Vomiting    reported by [facility] 05/13/14     Patient Measurements: Height: 5' (152.4 cm) Weight: 185 lb 3 oz (84 kg) IBW/kg (Calculated) : 45.5 Heparin dosing weight 64.5kg  Vital Signs: Temp: 97.5 F (36.4 C) (05/10 0902) Temp Source: Oral (05/10 0902) BP: 126/54 (05/10 1400) Pulse Rate: 42 (05/10 1400)  Labs: Recent Labs    06/13/18 0717  06/14/18 0217 06/14/18 1838 06/15/18 0443 06/15/18 0444 06/15/18 1449  HGB 13.1  --  12.5  --   --  12.0  --   HCT 41.5  --  39.5  --   --  38.5  --   PLT 149*  --  149*  --   --  146*  --   APTT 170*   < > 43* 49* 50*  --  51*  LABPROT  --   --   --   --  16.2*  --   --   INR  --   --   --   --  1.3*  --   --   HEPARINUNFRC >2.20*  --  >2.20* 1.76* 0.88*  --   --   CREATININE 0.93  --  0.76  --  0.70  --   --    < > = values in this interval not displayed.    Estimated Creatinine Clearance: 50.3 mL/min (by C-G formula based on SCr of 0.7 mg/dL).  Assessment: 85 yo F with left sided weakne2ss, facial droop, slurred speech on apixaban PTA for AFib switched to heparin. Patient has left thigh abscess with planned incision and drainage 5/9. However, due to patient's low heart rate, surgery has been delayed and will reevaluate 5/10. Heparin was held 5/9 at 0000. Per Dr. Luisa Hartornett heparin will need to be held 6 hour before I&D procedure, not rescheduled yet.   APTT subtherapeutic at 51 seconds. Hemoglobin 12, plt 146. No bleeding noted and  confirmed with nursing. Nursing does state there was trouble with the infusion - the line infiltrated ~15 minutes before the level was drawn.    Goal of Therapy:  Heparin level 0.3-0.7 units/ml aPTT 66 - 102 seconds Monitor platelets by anticoagulation protocol: Yes   Plan:  Cautiously increase Heparin to 800 units / hr Follow up aPTT in 8 hours Daily CBC, aPTT, heparin level and monitor for s/sx of bleeding  F/u plan for I&D F/u plans for resumption of apixaban after procedures  Foye DeerAnna Love, Pharm D PGY1 Pharmacy Resident  Phone [phone] Please use AMION for clinical pharmacists numbers  06/15/2018      3:44 PM

## 2018-06-15 NOTE — Plan of Care (Signed)
  Problem: Education: Goal: Knowledge of General Education information will improve Description Including pain rating scale, medication(s)/side effects and non-pharmacologic comfort measures Outcome: Progressing   Problem: Health Behavior/Discharge Planning: Goal: Ability to manage health-related needs will improve Outcome: Progressing   

## 2018-06-15 NOTE — Progress Notes (Signed)
PROGRESS NOTE    Margaret Collier  ZOX:[id number] DOB: July 26, 1931 DOA: 06/11/2018 PCP: System, Pcp Not In    Brief Narrative 85 year old with past medical history significant for hypertension, hyperlipidemia, chronic A. fib on Eliquis oxygen dependent at night 2 L, CVA with residual left-sided weakness, OSA who presents complaining with worsening confusion and a speech problem.  History has been limited to get from the patient due to history of dementia and report of increased; confusion. Patient is bedbound at baseline, at facility they use a Hoyer lift to get her up in the chair. Evaluation and admission: A CT head showed areas of previous stroke.  CT scan left femur showed 3.5 cm abscess left mid thigh. Patient also was noted to be bradycardic with heart rate in the 40s.  Patient was refusing care, and surgery. She has been confused.  She was evaluated by Psychiatrist, who recommended that patient lack capacity to consent for surgery or refuse medical care. Surgery was consulted, patient will have I and D done tomorrow.   Blood culture 1 out 4 bottle grew Staph coagulase negative. Probably a contaminant.   Assessment & Plan:   Principal Problem:   Evaluation by psychiatric service required Active Problems:   Atrial fibrillation (HCC)   OSA (obstructive sleep apnea)   History of CVA with residual deficit   Bradycardia   Cellulitis and abscess of left leg   AKI (acute kidney injury) (HCC)   Thrombocytopenia (HCC)  1-Left thigh abscess; cellulitis Patient present with confusion, leukocytosis, worsening redness of left lower extremity.  She failed oral antibiotic. -CT showed focal area of abscess. -Per Psych; Patient lack capacity to consent for surgery or refusing medical care.  -surgery  Cancel today due to decreased hr overnight. Patient hr in the 30 on 5/09. -IR consulted after I discussed case with Dr Luisa Hart on 5-09.  For evaluation of US guide aspiration.  -Continue with IV  antibiotics.  -Will consult palliative care for goals of care.  -As of today 5-10 per surgery note abscess draining on it own, continue with antibiotics. Await for HR stabilization. Will inform IR. Will ask surgery to evaluate patient 5-11 to see if patient still need US guide aspiration.    Blood culture positive 1--4 bottle. Discussed with ID, Dr Luciana Axe probably a contaminant. No need to get 2 ECHO. He agree with repeating blood cultures.  Staph, coagulase negative.  On IV vancomycin. Repeated blood cultures 5-08; No growth to date.   2-Acute metabolic encephalopathy; She  was transferred from her facility with increased confusion and questionable worsening left side weakness. CT head was negative. Psychiatric consulted for evaluation for capacity, patient lacks capacity to consent for surgery or refusing medical care.   3-Bradycardia, asymptomatic -5-09; patient with episodes of bradycardia overnight 5-09, and pauses. Cardiology consulted again. See Dr Royann Shivers recommendations. No need for pacemaker at this time and implementing pacemaker with active infection is contraindicated.  -5-10; HR lower this am in the 20. Dr Edman Circle started Dopamine in case patient required procedure, aspiration. He also consulted EP for evaluation, but likely not good idea to place pacemaker with active infection.  -Elevated TSH, but Free T 4 normal, await free T 3.   4-Permanent A. fib on Eliquis: Eliquis on hold due to possible surgical procedure. Cardiology  recommending heparin.  Continue with heparin per pharmacy  5-History of dementia 6-mild chronic thrombocytopenia Stable.   7-AKI: Prior creatinine around 0.8.  Suspect prerenal.  Continue with IV fluids.  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  9-Constipation; having multiples BM, stop laxatives.   10-Chronic diastolic heart failure: Holding Lasix due to AKI and infection.   11-history of CVA with residual left-sided weakness, neuropathy: Repeated CT head negative.  12-OSA: Continue with 2 L of oxygen at night.  Estimated body mass index is 36.17 kg/m as calculated from the following:   Height as of this encounter: 5' (1.524 m).   Weight as of this encounter: 84 kg.   DVT prophylaxis: Was on Eliquis prior to admission, currently on hold due to possible surgical procedure Code Status: Full code Family Communication;   Disposition Plan: Patient needs to remain in the hospital for IV antibiotics, she failed oral antibiotic for left thigh abscess.  Continue with antibiotics, started on Dopamine.   Consultants:   Cardiology  Surgery    Procedures:   none   Antimicrobials:   Ancef 5-06  Vancomycin 5-06   Subjective: She appears more alert today, more conversant. Suspect yesterday she had some hypoactive deilrium.  She report multiples BM. She denies pain. She is not in controlled. Her niece is in controlled.    Objective: Vitals:   06/14/18 1934 06/15/18 0524 06/15/18 0902 06/15/18 1132  BP: (!) 138/50 (!) 130/50 122/62 (!) 130/56  Pulse: (!) 39 (!) 36 (!) 40 (!) 50  Resp: 20 18 16    Temp: (!) 97 F (36.1 C) (!) 97 F (36.1 C) (!) 97.5 F (36.4 C)   TempSrc:   Oral   SpO2: 100% 100% 100% 100%  Weight:  84 kg    Height:        Intake/Output Summary (Last 24 hours) at 06/15/2018 1254 Last data filed at 06/15/2018 0946 Gross per 24 hour  Intake 1491.39 ml  Output 500 ml  Net 991.39 ml   Filed Weights   06/13/18 0410 06/14/18 0100 06/15/18 0524  Weight: 83.4 kg 85.3 kg 84 kg    Examination:  General exam: NAD Respiratory system: CTA Cardiovascular system: S 1, S 2 RRR Gastrointestinal system: BS present, soft, nt Central nervous system: Chronic left side weakness, chronic contraction left arm, hand.  Extremities: trace edema. Right leg with skin tear, bloody, cover with dressing Skin: left thigh with less redness, open  area was draining.     Data Reviewed: I have personally reviewed following labs and imaging studies  CBC: Recent Labs  Lab 06/11/18 0918 06/12/18 0542 06/13/18 0717 06/14/18 0217 06/15/18 0444  WBC 11.0* 13.6* 8.4 6.4 8.8  NEUTROABS 7.8*  --   --   --   --   HGB 13.7 12.6 13.1 12.5 12.0  HCT 44.3 40.2 41.5 39.5 38.5  MCV 83.4 82.2 81.9 81.6 81.7  PLT 149* 146* 149* 149* 146*   Basic Metabolic Panel: Recent Labs  Lab 06/11/18 0918 06/11/18 0929 06/12/18 0542 06/13/18 0717 06/14/18 0217 06/14/18 0223 06/15/18 0443  NA 141  --  141 145 146*  --  140  K 4.2  --  4.3 4.0 3.6  --  3.8  CL 101  --  105 107 110  --  106  CO2 28  --  25 22 24   --  22  GLUCOSE 77  --  90 81 76  --  121*  BUN 46*  --  37* 27* 19  --  17  CREATININE 1.15* 1.20* 1.14* 0.93 0.76  --  0.70  CALCIUM 9.7  --  9.0  9.2 8.8*  --  8.6*  MG  --   --  2.1  --   --  2.0  --    GFR: Estimated Creatinine Clearance: 50.3 mL/min (by C-G formula based on SCr of 0.7 mg/dL). Liver Function Tests: Recent Labs  Lab 06/11/18 0918  AST 31  ALT 22  ALKPHOS 128*  BILITOT 0.9  PROT 7.1  ALBUMIN 3.3*   No results for input(s): LIPASE, AMYLASE in the last 168 hours. No results for input(s): AMMONIA in the last 168 hours. Coagulation Profile: Recent Labs  Lab 06/11/18 0918 06/12/18 0542 06/15/18 0443  INR 2.1* 2.2* 1.3*   Cardiac Enzymes: No results for input(s): CKTOTAL, CKMB, CKMBINDEX, TROPONINI in the last 168 hours. BNP (last 3 results) No results for input(s): PROBNP in the last 8760 hours. HbA1C: No results for input(s): HGBA1C in the last 72 hours. CBG: Recent Labs  Lab 06/14/18 0541 06/14/18 1014 06/15/18 0839  GLUCAP 88 89 136*   Lipid Profile: No results for input(s): CHOL, HDL, LDLCALC, TRIG, CHOLHDL, LDLDIRECT in the last 72 hours. Thyroid Function Tests: Recent Labs    06/15/18 0443 06/15/18 0444  TSH 6.660*  --   FREET4  --  0.95   Anemia Panel: No results for input(s):  VITAMINB12, FOLATE, FERRITIN, TIBC, IRON, RETICCTPCT in the last 72 hours. Sepsis Labs: Recent Labs  Lab 06/11/18 1610 06/11/18 1113  LATICACIDVEN 0.9 1.0    Recent Results (from the past 240 hour(s))  Culture, blood (routine x 2)     Status: Abnormal   Collection Time: 06/11/18  9:18 AM  Result Value Ref Range Status   Specimen Description BLOOD RIGHT ANTECUBITAL  Final   Special Requests   Final    BOTTLES DRAWN AEROBIC AND ANAEROBIC Blood Culture adequate volume   Culture  Setup Time   Final    GRAM POSITIVE COCCI ANAEROBIC BOTTLE ONLY CRITICAL RESULT CALLED TO, READ BACK BY AND VERIFIED WITH: R. FANNING PHARMD, AT 1334 06/12/18 BY D. VANHOOK    Culture (A)  Final    STAPHYLOCOCCUS SPECIES (COAGULASE NEGATIVE) THE SIGNIFICANCE OF ISOLATING THIS ORGANISM FROM A SINGLE SET OF BLOOD CULTURES WHEN MULTIPLE SETS ARE DRAWN IS UNCERTAIN. PLEASE NOTIFY THE MICROBIOLOGY DEPARTMENT WITHIN ONE WEEK IF SPECIATION AND SENSITIVITIES ARE REQUIRED. Performed at [facility], [address], [place], [place] [address]    Report Status 06/14/2018 FINAL  Final  Culture, blood (routine x 2)     Status: None (Preliminary result)   Collection Time: 06/11/18  9:18 AM  Result Value Ref Range Status   Specimen Description BLOOD BLOOD RIGHT FOREARM  Final   Special Requests   Final    BOTTLES DRAWN AEROBIC AND ANAEROBIC Blood Culture adequate volume   Culture   Final    NO GROWTH 4 DAYS Performed at [facility], [address] [place], [place] [address]    Report Status PENDING  Incomplete  SARS Coronavirus 2 (CEPHEID - Performed in [facility] hospital lab), Hosp Order     Status: None   Collection Time: 06/11/18  9:18 AM  Result Value Ref Range Status   SARS Coronavirus 2 NEGATIVE NEGATIVE Final    Comment: (NOTE) If result is NEGATIVE SARS-CoV-2 target nucleic acids are NOT DETECTED. The SARS-CoV-2 RNA is generally detectable in upper and lower  respiratory specimens during the  acute phase of infection. The lowest  concentration of SARS-CoV-2 viral copies this assay can detect is 250  copies /  mL. A negative result does not preclude SARS-CoV-2 infection  and should not be used as the sole basis for treatment or other  patient management decisions.  A negative result may occur with  improper specimen collection / handling, submission of specimen other  than nasopharyngeal swab, presence of viral mutation(s) within the  areas targeted by this assay, and inadequate number of viral copies  (<250 copies / mL). A negative result must be combined with clinical  observations, patient history, and epidemiological information. If result is POSITIVE SARS-CoV-2 target nucleic acids are DETECTED. The SARS-CoV-2 RNA is generally detectable in upper and lower  respiratory specimens dur ing the acute phase of infection.  Positive  results are indicative of active infection with SARS-CoV-2.  Clinical  correlation with patient history and other diagnostic information is  necessary to determine patient infection status.  Positive results do  not rule out bacterial infection or co-infection with other viruses. If result is PRESUMPTIVE POSTIVE SARS-CoV-2 nucleic acids MAY BE PRESENT.   A presumptive positive result was obtained on the submitted specimen  and confirmed on repeat testing.  While 2019 novel coronavirus  (SARS-CoV-2) nucleic acids may be present in the submitted sample  additional confirmatory testing may be necessary for epidemiological  and / or clinical management purposes  to differentiate between  SARS-CoV-2 and other Sarbecovirus currently known to infect humans.  If clinically indicated additional testing with an alternate test  methodology [phone]) is advised. The SARS-CoV-2 RNA is generally  detectable in upper and lower respiratory sp ecimens during the acute  phase of infection. The expected result is Negative. Fact Sheet for Patients:   BoilerBrush.com.cy Fact Sheet for Healthcare Providers: https://pope.com/ This test is not yet approved or cleared by the [place] FDA and has been authorized for detection and/or diagnosis of SARS-CoV-2 by FDA under an Emergency Use Authorization (EUA).  This EUA will remain in effect (meaning this test can be used) for the duration of the COVID-19 declaration under Section 564(b)(1) of the Act, 21 U.S.C. section 360bbb-3(b)(1), unless the authorization is terminated or revoked sooner. Performed at [facility], [address] [place], [place] [address]   Blood Culture ID Panel (Reflexed)     Status: Abnormal   Collection Time: 06/11/18  9:18 AM  Result Value Ref Range Status   Enterococcus species NOT DETECTED NOT DETECTED Final   Listeria monocytogenes NOT DETECTED NOT DETECTED Final   Staphylococcus species DETECTED (A) NOT DETECTED Final    Comment: Methicillin (oxacillin) susceptible coagulase negative staphylococcus. Possible blood culture contaminant (unless isolated from more than one blood culture draw or clinical case suggests pathogenicity). No antibiotic treatment is indicated for blood  culture contaminants. CRITICAL RESULT CALLED TO, READ BACK BY AND VERIFIED WITH: R. FANNING PHARMD, AT 1334 06/12/18 BY D. VANHOOK    Staphylococcus aureus (BCID) NOT DETECTED NOT DETECTED Final   Methicillin resistance NOT DETECTED NOT DETECTED Final   Streptococcus species NOT DETECTED NOT DETECTED Final   Streptococcus agalactiae NOT DETECTED NOT DETECTED Final   Streptococcus pneumoniae NOT DETECTED NOT DETECTED Final   Streptococcus pyogenes NOT DETECTED NOT DETECTED Final   Acinetobacter baumannii NOT DETECTED NOT DETECTED Final   Enterobacteriaceae species NOT DETECTED NOT DETECTED Final   Enterobacter cloacae complex NOT DETECTED NOT DETECTED Final   Escherichia coli NOT DETECTED NOT DETECTED Final   Klebsiella oxytoca NOT  DETECTED NOT DETECTED Final   Klebsiella pneumoniae NOT DETECTED NOT DETECTED Final   Proteus species NOT  DETECTED NOT DETECTED Final   Serratia marcescens NOT DETECTED NOT DETECTED Final   Haemophilus influenzae NOT DETECTED NOT DETECTED Final   Neisseria meningitidis NOT DETECTED NOT DETECTED Final   Pseudomonas aeruginosa NOT DETECTED NOT DETECTED Final   Candida albicans NOT DETECTED NOT DETECTED Final   Candida glabrata NOT DETECTED NOT DETECTED Final   Candida krusei NOT DETECTED NOT DETECTED Final   Candida parapsilosis NOT DETECTED NOT DETECTED Final   Candida tropicalis NOT DETECTED NOT DETECTED Final    Comment: Performed at [facility], [address] [place], [place] [address]  MRSA PCR Screening     Status: Abnormal   Collection Time: 06/11/18 11:55 PM  Result Value Ref Range Status   MRSA by PCR POSITIVE (A) NEGATIVE Final    Comment:        The GeneXpert MRSA Assay (FDA approved for NASAL specimens only), is one component of a comprehensive MRSA colonization surveillance program. It is not intended to diagnose MRSA infection nor to guide or monitor treatment for MRSA infections. RESULT CALLED TO, READ BACK BY AND VERIFIED WITH: L. Sheliah Plane [id number] 06/12/2018 Girtha Hake Performed at [facility], [address] [place], [place] [address]   Culture, blood (routine x 2)     Status: None (Preliminary result)   Collection Time: 06/13/18  2:56 PM  Result Value Ref Range Status   Specimen Description BLOOD RIGHT HAND  Final   Special Requests   Final    BOTTLES DRAWN AEROBIC AND ANAEROBIC Blood Culture adequate volume   Culture   Final    NO GROWTH 2 DAYS Performed at [facility], [address] [place], [place] [address]    Report Status PENDING  Incomplete  Culture, blood (routine x 2)     Status: None (Preliminary result)   Collection Time: 06/13/18  3:06 PM  Result Value Ref Range Status   Specimen Description BLOOD LEFT HAND  Final   Special  Requests   Final    BOTTLES DRAWN AEROBIC AND ANAEROBIC Blood Culture adequate volume   Culture   Final    NO GROWTH 2 DAYS Performed at [facility], [address] [place], [place] [address]    Report Status PENDING  Incomplete  Surgical pcr screen     Status: Abnormal   Collection Time: 06/14/18  1:25 AM  Result Value Ref Range Status   MRSA, PCR POSITIVE (A) NEGATIVE Final    Comment: CRITICAL RESULT CALLED TO, READ BACK BY AND VERIFIED WITH: RN Lucrezia Europe [id number]  THANEY     Staphylococcus aureus POSITIVE (A) NEGATIVE Final    Comment: CRITICAL RESULT CALLED TO, READ BACK BY AND VERIFIED WITH: RN Lucrezia Europe [id number]  THANEY Performed at [facility], [address], [place], [place] [address]          Radiology Studies: [place] Lt Lower Extrem Ltd Soft Tissue Non Vascular  Result Date: 06/14/2018 CLINICAL DATA:  Superficial medial left thigh abscess by CT EXAM: ULTRASOUND LEFT LOWER EXTREMITY LIMITED TECHNIQUE: Ultrasound examination of the lower extremity soft tissues was performed in the area of clinical concern. COMPARISON:  06/11/2018 CT FINDINGS: Medial left thigh area of concern demonstrates localized skin thickening. Deep to the skin, there is a superficial subcutaneous complex fluid collection measuring 1.6 x 2.0 x 3.4 cm. Internal contents are heterogeneous and hypoechoic. This correlates with the CT finding. IMPRESSION: 3.4 cm left medial thigh subcutaneous complex fluid collection  compatible with abscess. Electronically Signed   By: Judie PetitM.  Shick M.D.   On: 06/14/2018 14:31        Scheduled Meds: . atorvastatin  40 mg Oral q1800  . calcium carbonate  1 tablet Oral TID AC  . chlorhexidine  15 mL Mouth/Throat BID  . Chlorhexidine Gluconate Cloth  6 each Topical Q0600  . mupirocin ointment  1 application Nasal BID  . pantoprazole  40 mg Oral Q0600  . pregabalin  25 mg Oral Q8H  . sodium chloride flush  3 mL Intravenous Q12H   Continuous Infusions:  . sodium chloride 250 mL (06/14/18 0402)  .  ceFAZolin (ANCEF) IV 1 g (06/15/18 0345)  . dextrose Stopped (06/15/18 1200)  . DOPamine 5 mcg/kg/min (06/15/18 0934)  . heparin 700 Units/hr (06/15/18 0620)  . vancomycin 1,500 mg (06/13/18 2140)     LOS: 3 days    Time spent: 35 minutes    Alba CoryBelkys A Klay Sobotka, MD [facility] Pager [phone]  If 7PM-7AM, please contact night-coverage [email or web address] Password [email or web address] 06/15/2018, 12:54 PM

## 2018-06-15 NOTE — Consult Note (Addendum)
Consultation Note Date: 06/15/2018   Patient Name: Margaret Collier  DOB: 1931/05/26  MRN: [id number]  Age / Sex: 85 y.o., female  PCP: System, Pcp Not In Referring Physician: Alba Cory, MD  Reason for Consultation: Establishing goals of care  HPI/Patient Profile: 85 y.o. female admitted on 06/11/2018 with past medical history significant of HTN, HLD, chronic A. 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  Patient currently lives at [facility] assisted living facility.  Spoke to her niece who lives in [place] and she endorses that the patient has had continued physical and functional decline since her stroke in 2019, she has been nonambulatory since that stroke.  She tells me that the patient has some mild memory deficits but has never been diagnosed with dementia.  Patient has been intermittently confused and agitated and evaluated by psychiatry and found to not have capacity on May 7.     Patient currently with episodes of bradycardia and is currently seen by cardiology.  Her bradycardia has not been symptomatic.  According to cardiology she does not meet criteria for implantation of pacemeaker secondary to infection.  Patient is high risk for decompensation and family face treatment option decisions, advanced directive decisions and anticipatory care needs.   Clinical Assessment and Goals of Care:  This NP Lorinda Creed reviewed medical records, received report from team, assessed the patient and then meet at the patient's bedside and spoke to niece/ Margaret Collier by telephone  to discuss diagnosis, prognosis, GOC, EOL wishes disposition and options.  Concept of Hospice and Palliative Care were discussed  A detailed discussion was had today regarding advanced directives.   Concepts specific to code status, artifical feeding and hydration, continued IV antibiotics and rehospitalization was had.  The difference between a aggressive medical intervention path  and a palliative comfort care path for this patient at this time was had.  Values and goals of care important to patient and family were attempted to be elicited.  Niece was somewhat surprised by the conversation that we had today.  She verbalizes that ultimately she wishes that the patient could make these decisions for herself.  I shared with her my concern that unfortunately the patient may not today or ever be able to make these decisions and that she will be charged with making the important decisions regarding overall plan of care and treatment options ensuring that these decisions are within the context of the patient's best interest, values and goals of care    Margaret Collier is HPOA is currently living in [place].      SUMMARY OF RECOMMENDATIONS    Code Status/Advance Care Planning:  Full code-strongly encouraged DNR/DNI status knowing poor outcomes in similar patients.   Palliative Prophylaxis:   Aspiration, Bowel Regimen, Delirium Protocol, Frequent Pain Assessment and Oral Care  Additional Recommendations (Limitations, Scope, Preferences):  Full Scope Treatment   Evaluate patient in 24 hours and  continue conversation regarding goals of care. This NP will follow up with telephone call to Margaret Collier  Psycho-social/Spiritual:   Desire for further Chaplaincy support:yes  Additional Recommendations: Education on Hospice  Prognosis:   Unable to determine  Discharge Planning: To Be Determined      Primary Diagnoses: Present on Admission: . Cellulitis and abscess of left leg . AKI (acute kidney injury) (HCC) . Bradycardia . Thrombocytopenia (HCC) . Atrial fibrillation (HCC) . OSA (obstructive sleep apnea)   I have reviewed the medical record, interviewed the patient and  family, and examined the patient. The following aspects are pertinent.  Past Medical History:  Diagnosis Date  . Atrial fibrillation (HCC)   . Hyperlipidemia   . Hypertension   . OSA (obstructive sleep apnea)    Social History   Socioeconomic History  . Marital status: Single    Spouse name: Not on file  . Number of children: Not on file  . Years of education: Not on file  . Highest education level: Not on file  Occupational History  . Not on file  Social Needs  . Financial resource strain: Not on file  . Food insecurity:    Worry: Not on file    Inability: Not on file  . Transportation needs:    Medical: Not on file    Non-medical: Not on file  Tobacco Use  . Smoking status: Never Smoker  . Smokeless tobacco: Never Used  Substance and Sexual Activity  . Alcohol use: Not on file  . Drug use: Not on file  . Sexual activity: Not on file  Lifestyle  . Physical activity:    Days per week: Not on file    Minutes per session: Not on file  . Stress: Not on file  Relationships  . Social connections:    Talks on phone: Not on file    Gets together: Not on file    Attends religious service: Not on file    Active member of club or organization: Not on file    Attends meetings of clubs or organizations: Not on file    Relationship status: Not on file  Other Topics Concern  . Not on file  Social History Narrative  . Not on file   Family History  Problem Relation Age of Onset  . Heart disease Mother    Scheduled Meds: . atorvastatin  40 mg Oral q1800  . calcium carbonate  1 tablet Oral TID AC  . chlorhexidine  15 mL Mouth/Throat BID  . Chlorhexidine Gluconate Cloth  6 each Topical Q0600  . mupirocin ointment  1 application Nasal BID  . pantoprazole  40 mg Oral Q0600  . pregabalin  25 mg Oral Q8H  . sodium chloride flush  3 mL Intravenous Q12H   Continuous Infusions: . sodium chloride 250 mL (06/14/18 0402)  .  ceFAZolin (ANCEF) IV 1 g (06/15/18 0345)  . dextrose  Stopped (06/15/18 1200)  . DOPamine 5 mcg/kg/min (06/15/18 0934)  . heparin 700 Units/hr (06/15/18 0620)  . vancomycin 1,500 mg (06/13/18 2140)   PRN Meds:.sodium chloride, acetaminophen **OR** acetaminophen, albuterol, atropine, ondansetron **OR** ondansetron (ZOFRAN) IV, oxyCODONE, phenol Medications Prior to Admission:  Prior to Admission medications   Medication Sig Start Date End Date Taking? Authorizing Provider  albuterol (VENTOLIN HFA) 108 (90 Base) MCG/ACT inhaler Inhale 2 puffs into the lungs every 4 (four) hours as needed for wheezing or shortness of breath.   Yes [provider]  apixaban (ELIQUIS) 5 MG  TABS tablet Take 1 tablet (5 mg total) by mouth 2 (two) times daily. 01/28/18 06/11/18 Yes Jerald Kief, MD  atorvastatin (LIPITOR) 40 MG tablet Take 40 mg by mouth daily at 6 PM.    Yes [provider]  b complex vitamins capsule Take 1 capsule by mouth daily.   Yes [provider]  calcium carbonate (TUMS - DOSED IN MG ELEMENTAL CALCIUM) 500 MG chewable tablet Chew 1 tablet by mouth 3 (three) times daily before meals.   Yes [provider]  cephALEXin (KEFLEX) 500 MG capsule Take 500 mg by mouth every 8 (eight) hours.   Yes [provider]  chlorhexidine (PERIDEX) 0.12 % solution Use as directed 15 mLs in the mouth or throat 2 (two) times daily.   Yes [provider]  cholecalciferol (VITAMIN D3) 25 MCG (1000 UT) tablet Take 1,000 Units by mouth daily.   Yes [provider]  furosemide (LASIX) 40 MG tablet Take 40 mg by mouth daily.    Yes [provider]  furosemide (LASIX) 80 MG tablet Take 1 tablet (80 mg total) by mouth daily. Patient taking differently: Take 20 mg by mouth as needed for fluid or edema. May have every day PRN for wt gain of 3 lbs in one day or 5 lbs in a week 01/28/18 06/11/18 Yes Jerald Kief, MD  oxyCODONE-acetaminophen (PERCOCET) 10-325 MG tablet Take 1 tablet by mouth every 12 (twelve)  hours. 01/28/18  Yes Jerald Kief, MD  OXYGEN Inhale 2 L/min into the lungs See admin instructions. Apply via nasal canula daily at bedtime and remove every morning   Yes [provider]  pantoprazole (PROTONIX) 40 MG tablet Take 40 mg by mouth daily at 6 (six) AM.    Yes [provider]  phenol (CHLORASEPTIC) 1.4 % LIQD Use as directed 2 sprays in the mouth or throat every 2 (two) hours as needed for throat irritation / pain. x48   Yes [provider]  polyethylene glycol (MIRALAX / GLYCOLAX) packet Take 17 g by mouth daily as needed for moderate constipation. 01/28/18  Yes Jerald Kief, MD  potassium chloride SA (K-DUR,KLOR-CON) 20 MEQ tablet Take 20 mEq by mouth daily.   Yes [provider]  pregabalin (LYRICA) 25 MG capsule Take 25 mg by mouth every 8 (eight) hours.    Yes [provider]  spironolactone (ALDACTONE) 25 MG tablet Take 0.5 tablets (12.5 mg total) by mouth daily. 01/29/18 06/11/18 Yes Jerald Kief, MD   Allergies  Allergen Reactions  . Zanaflex [Tizanidine Hcl] Shortness Of Breath, Other (See Comments) and Cough    Dehydration - reported by [facility] 10/07/13  . Alprazolam Other (See Comments)    Dry mouth - reported by [facility] 05/13/14   . Carisoprodol Other (See Comments)    Dizziness - reported by [facility] 05/13/14   . Chlorthalidone Other (See Comments)    syncope  . Colesevelam Other (See Comments)    GI upset and stool change - reported by [facility] 05/13/14   . Dilaudid [Hydromorphone Hcl] Hives and Swelling  . Duloxetine Other (See Comments)    Dizziness - reported by [facility] 05/13/14   . Felodipine Other (See Comments)    Dizziness, groin pain - reported by [facility] 05/13/14   . Fosamax [Alendronate Sodium] Nausea And Vomiting and Other (See Comments)    Back and hip pain  . Furosemide Other (See  Comments)    Dry mouth/dizziness - reported by [facility] 05/13/14   .  Gabapentin Other (See Comments)    Insomnia - reported by [facility] 05/13/14   . Ibuprofen Other (See Comments)    GI pain - reported by [facility] 05/13/14   . Lovastatin Other (See Comments)    Weakness - reported by [facility] 05/13/14   . Meclizine Other (See Comments)    Sleeplessness - reported by [facility] 05/13/14   . Meperidine Other (See Comments)    Jitteriness - reported by [facility] 05/13/14   . Methadone Other (See Comments)    Stool impaction - reported by [facility] 05/13/14   . Morphine Other (See Comments)    Dizziness - reported by [facility] 05/13/14   . Niacin Other (See Comments)    Flushing - reported by [facility] 05/13/14   . Omeprazole Other (See Comments)    Sleeplessness - reported by [facility] 05/13/14   . Oxycodone Other (See Comments)    Groin pain - reported by [facility] 05/13/14   . Pravastatin Other (See Comments)    Weakness - reported by [facility] 05/13/14   . Simvastatin Other (See Comments)    Weakness - reported by [facility] 05/13/14   . Statins Other (See Comments)    Myalgia, weakness - reported by [facility] 05/13/14  . Tramadol Other (See Comments)    Nervous - reported by [facility] 05/13/14   . Valsartan Other (See Comments)    Dizziness - reported by [facility] 05/13/14   . Hydrocodone-Acetaminophen Nausea And Vomiting    reported by [facility] 05/13/14    Review of Systems  Musculoskeletal: Positive for back pain.    Physical Exam Constitutional:      General: She is awake.     Appearance: She is normal weight. She is ill-appearing.     Interventions: Nasal cannula in place.  Cardiovascular:     Rate and Rhythm: Bradycardia present.  Pulmonary:     Breath sounds: Decreased breath sounds present.     Vital Signs: BP 122/62 (BP Location: Right Arm)   Pulse (!) 40   Temp (!) 97.5 F (36.4 C) (Oral)   Resp 16   Ht 5' (1.524 m)   Wt 84 kg   SpO2 100%    BMI 36.17 kg/m  Pain Scale: 0-10   Pain Score: Asleep   SpO2: SpO2: 100 % O2 Device:SpO2: 100 % O2 Flow Rate: .O2 Flow Rate (L/min): 2 L/min  IO: Intake/output summary:   Intake/Output Summary (Last 24 hours) at 06/15/2018 1004 Last data filed at 06/15/2018 0946 Gross per 24 hour  Intake 1491.39 ml  Output 500 ml  Net 991.39 ml    LBM: Last BM Date: 06/13/18(per patient) Baseline Weight: Weight: 83.9 kg Most recent weight: Weight: 84 kg     Palliative Assessment/Data: 30 % at best   Discussed with Dr Carmela Rimaegalato   Time In: 0750 Time Out: 0900 Time Total: 70 minutes Greater than 50%  of this time was spent counseling and coordinating care related to the above assessment and plan.  Signed by: Lorinda CreedMary Devorah Givhan, NP   Please contact Palliative Medicine Team phone at [phone] for questions and concerns.  For individual provider: See Loretha StaplerAmion

## 2018-06-15 NOTE — Progress Notes (Signed)
Patient ID: Margaret Collier, female   DOB: [birth year], 85 y.o.   MRN: [id number] Pt was tent scheduled for US guided aspiration of left thigh abscess today, however HR remains very low(occasionally in 20's ), although pt remains asymptomatic per nursing. Cardiology advises against proceeding with aspiration today. Will reassess tomorrow.  Pt's niece aware.

## 2018-06-15 NOTE — Progress Notes (Signed)
[facility] Progress Note  1 Day Post-Op  Subjective: CC-  Patient comfortable this morning. Continues to have some pain in left thigh.  CBC pending, afebrile.  Objective: Vital signs in last 24 hours: Temp:  [97 F (36.1 C)-97.8 F (36.6 C)] 97 F (36.1 C) (05/10 0524) Pulse Rate:  [36-49] 36 (05/10 0524) Resp:  [18-20] 18 (05/10 0524) BP: (130-156)/(50-68) 130/50 (05/10 0524) SpO2:  [98 %-100 %] 100 % (05/10 0524) Weight:  [84 kg] 84 kg (05/10 0524) Last BM Date: 06/13/18(per patient)  Intake/Output from previous day: 05/09 0701 - 05/10 0700 In: 341.2 [P.O.:237; I.V.:4.2; IV Piggyback:100] Out: 300 [Urine:300] Intake/Output this shift: No intake/output data recorded.  PE: Gen:  Alert, NAD, pleasant HEENT: EOM's intact, pupils equal and round Card:  bradycardic Pulm:  effort normal Abd: Soft, NT/ND, +BS, no HSM Left thigh: mid/medial left thigh abscess with trace active purulent drainage, persistent cellulitis but is improved since admission Skin: warm and dry  Lab Results:  Recent Labs    06/13/18 0717 06/14/18 0217  WBC 8.4 6.4  HGB 13.1 12.5  HCT 41.5 39.5  PLT 149* 149*   BMET Recent Labs    06/14/18 0217 06/15/18 0443  NA 146* 140  K 3.6 3.8  CL 110 106  CO2 24 22  GLUCOSE 76 121*  BUN 19 17  CREATININE 0.76 0.70  CALCIUM 8.8* 8.6*   PT/INR Recent Labs    06/15/18 0443  LABPROT 16.2*  INR 1.3*   CMP     Component Value Date/Time   NA 140 06/15/2018 0443   K 3.8 06/15/2018 0443   CL 106 06/15/2018 0443   CO2 22 06/15/2018 0443   GLUCOSE 121 (H) 06/15/2018 0443   BUN 17 06/15/2018 0443   CREATININE 0.70 06/15/2018 0443   CALCIUM 8.6 (L) 06/15/2018 0443   PROT 7.1 06/11/2018 0918   ALBUMIN 3.3 (L) 06/11/2018 0918   AST 31 06/11/2018 0918   ALT 22 06/11/2018 0918   ALKPHOS 128 (H) 06/11/2018 0918   BILITOT 0.9 06/11/2018 0918   GFRNONAA >60 06/15/2018 0443   GFRAA >60 06/15/2018 0443   Lipase  No results found  for: LIPASE     Studies/Results: [place] Lt Lower Extrem Ltd Soft Tissue Non Vascular  Result Date: 06/14/2018 CLINICAL DATA:  Superficial medial left thigh abscess by CT EXAM: ULTRASOUND LEFT LOWER EXTREMITY LIMITED TECHNIQUE: Ultrasound examination of the lower extremity soft tissues was performed in the area of clinical concern. COMPARISON:  06/11/2018 CT FINDINGS: Medial left thigh area of concern demonstrates localized skin thickening. Deep to the skin, there is a superficial subcutaneous complex fluid collection measuring 1.6 x 2.0 x 3.4 cm. Internal contents are heterogeneous and hypoechoic. This correlates with the CT finding. IMPRESSION: 3.4 cm left medial thigh subcutaneous complex fluid collection compatible with abscess. Electronically Signed   By: Judie PetitM.  Shick M.D.   On: 06/14/2018 14:31    Anti-infectives: Anti-infectives (From admission, onward)   Start     Dose/Rate Route Frequency Ordered Stop   06/13/18 1200  vancomycin (VANCOCIN) 1,500 mg in sodium chloride 0.9 % 500 mL IVPB     1,500 mg 250 mL/hr over 120 Minutes Intravenous Every 48 hours 06/11/18 1233     06/11/18 2200  ceFAZolin (ANCEF) IVPB 1 g/50 mL premix     1 g 100 mL/hr over 30 Minutes Intravenous Every 8 hours 06/11/18 1335     06/11/18 1300  vancomycin (VANCOCIN) 1,750 mg in sodium chloride 0.9 %  500 mL IVPB     1,750 mg 250 mL/hr over 120 Minutes Intravenous  Once 06/11/18 1233 06/11/18 1512   06/11/18 1215  ceFAZolin (ANCEF) IVPB 1 g/50 mL premix     1 g 100 mL/hr over 30 Minutes Intravenous  Once 06/11/18 1212 06/11/18 1300   06/11/18 1215  vancomycin (VANCOCIN) IVPB 1000 mg/200 mL premix  Status:  Discontinued     1,000 mg 200 mL/hr over 60 Minutes Intravenous  Once 06/11/18 1212 06/11/18 1233       Assessment/Plan A fib on eliquis H/o CVA with residual left sided weakness HLD CHF OSA on 2L O2 [place] at night Bedbound  Left thigh abscess - 3.5 cmabscess mid left medial thigh with diffuse cellulitis,  open today and spontaneously draining. Cellulitis improving. I&D in OR cancelled 5/9 due to bradycardia.  Cardiology assessed and patient not candidate for pacemaker. IR consult pending for aspiration. Continue abx. Palliative consult for goals of care pending.  ID - ancef/vancomycin 5/6>> VTE -SCDs, ok for lovenox, IV heparin FEN - NPO Foley - wick Follow up -TBD    LOS: 3 days    Franne Forts , [facility] 06/15/2018, 7:56 AM Pager: [phone] Mon-Thurs 7:00 am-4:30 pm Fri 7:00 am -11:30 AM Sat-Sun 7:00 am-11:30 am

## 2018-06-15 NOTE — Progress Notes (Signed)
Progress Note  Patient Name: Margaret Collier Date of Encounter: 06/15/2018  Primary Cardiologist: Merdis Delay, PA-C [facility])  Subjective   She is even more bradycardic today, but remains remarkably asymptomatic.  She was asleep when I walked into the room but was easy to awake.  When I introduced myself as a cardiologist she asked me: "So, how's my heart?".  She denies chest discomfort shortness of breath or lightheadedness.  She is lying in bed at a 45 degree head of bed elevation appears comfortable.  Review of systems is otherwise limited by her cognitive deficits. Renal function parameters remain normal.  I am not confident that in/out, including urine output have been collected accurately. Arterial blood gas performed yesterday shows normal pH (in fact mild respiratory alkalosis) and normal oxygenation. TSH is mildly elevated, but was normal in mid December.  Inpatient Medications    Scheduled Meds:  atorvastatin  40 mg Oral q1800   calcium carbonate  1 tablet Oral TID AC   chlorhexidine  15 mL Mouth/Throat BID   Chlorhexidine Gluconate Cloth  6 each Topical Q0600   mupirocin ointment  1 application Nasal BID   pantoprazole  40 mg Oral Q0600   pregabalin  25 mg Oral Q8H   sodium chloride flush  3 mL Intravenous Q12H   Continuous Infusions:  sodium chloride 250 mL (06/14/18 0402)    ceFAZolin (ANCEF) IV 1 g (06/15/18 0345)   dextrose 50 mL/hr at 06/15/18 0549   DOPamine     heparin 700 Units/hr (06/15/18 0620)   vancomycin 1,500 mg (06/13/18 2140)   PRN Meds: sodium chloride, acetaminophen **OR** acetaminophen, albuterol, atropine, ondansetron **OR** ondansetron (ZOFRAN) IV, oxyCODONE, phenol   Vital Signs    Vitals:   06/14/18 0407 06/14/18 1149 06/14/18 1934 06/15/18 0524  BP: (!) 156/82 (!) 156/68 (!) 138/50 (!) 130/50  Pulse: 61 (!) 49 (!) 39 (!) 36  Resp: Temp: 97.7 F (36.5 C) 97.8 F (36.6 C) (!) 97 F (36.1  C) (!) 97 F (36.1 C)  TempSrc: Oral Oral    SpO2: 100% 98% 100% 100%  Weight:    84 kg  Height:        Intake/Output Summary (Last 24 hours) at 06/15/2018 0834 Last data filed at 06/14/2018 1858 Gross per 24 hour  Intake 337 ml  Output 300 ml  Net 37 ml   Last 3 Weights 06/15/2018 06/14/2018 06/13/2018  Weight (lbs) 185 lb 3 oz 188 lb 0.8 oz 183 lb 14.4 oz  Weight (kg) 84 kg 85.3 kg 83.416 kg      Telemetry    Atrial fibrillation with slow ventricular response, narrow QRS complex; severe bradycardia, ventricular rate typically in the mid 30s, dipping down to the mid 20s.  Occasional periods of ventricular escape rhythm with wide complex are seen.- Personally Reviewed  ECG    Atrial fibrillation with slow ventricular response- Personally Reviewed  Physical Exam  Elderly, frail-appearing GEN: No acute distress.   Neck: No JVD Cardiac:  Irregular and slow, no murmurs, rubs, or gallops.  Respiratory:  Distant breath sounds, but otherwise clear to auscultation bilaterally. GI: Soft, nontender, non-distended  MS: No edema; No deformity. Neuro:  Mild left-sided weakness Psych: Flat affect  Labs    Chemistry Recent Labs  Lab 06/11/18 0918  06/13/18 0717 06/14/18 0217 06/15/18 0443  NA 141   < > 145 146* 140  K 4.2   < > 4.0 3.6 3.8  CL 101   < > 107 110 106  CO2 28   < > 22 24 22   GLUCOSE 77   < > 81 76 121*  BUN 46*   < > 27* 19 17  CREATININE 1.15*   < > 0.93 0.76 0.70  CALCIUM 9.7   < > 9.2 8.8* 8.6*  PROT 7.1  --   --   --   --   ALBUMIN 3.3*  --   --   --   --   AST 31  --   --   --   --   ALT 22  --   --   --   --   ALKPHOS 128*  --   --   --   --   BILITOT 0.9  --   --   --   --   GFRNONAA 44*   < > 56* >60 >60  GFRAA 51*   < > >60 >60 >60  ANIONGAP 12   < > 16* 12 12   < > = values in this interval not displayed.     Hematology Recent Labs  Lab 06/12/18 0542 06/13/18 0717 06/14/18 0217  WBC 13.6* 8.4 6.4  RBC 4.89 5.07 4.84  HGB 12.6 13.1 12.5    HCT 40.2 41.5 39.5  MCV 82.2 81.9 81.6  MCH 25.8* 25.8* 25.8*  MCHC 31.3 31.6 31.6  RDW 17.2* 17.2* 17.3*  PLT 146* 149* 149*    Cardiac EnzymesNo results for input(s): TROPONINI in the last 168 hours. No results for input(s): TROPIPOC in the last 168 hours.   BNPNo results for input(s): BNP, PROBNP in the last 168 hours.   DDimer No results for input(s): DDIMER in the last 168 hours.   Radiology    [place] Lt Lower Extrem Ltd Soft Tissue Non Vascular  Result Date: 06/14/2018 CLINICAL DATA:  Superficial medial left thigh abscess by CT EXAM: ULTRASOUND LEFT LOWER EXTREMITY LIMITED TECHNIQUE: Ultrasound examination of the lower extremity soft tissues was performed in the area of clinical concern. COMPARISON:  06/11/2018 CT FINDINGS: Medial left thigh area of concern demonstrates localized skin thickening. Deep to the skin, there is a superficial subcutaneous complex fluid collection measuring 1.6 x 2.0 x 3.4 cm. Internal contents are heterogeneous and hypoechoic. This correlates with the CT finding. IMPRESSION: 3.4 cm left medial thigh subcutaneous complex fluid collection compatible with abscess. Electronically Signed   By: Judie PetitM.  Shick M.D.   On: 06/14/2018 14:31    Cardiac Studies   Echo report October 07, 2017 [place] Normal left ventricular size and systolic function with no appreciable segmental abnormality. Ejection fraction is estimated at 65-70% Diastolic function appears indeterminate Normal left ventricular wall thickness Mild mitral annular calcification. Mild mitral regurgitation. Mild aortic regurgitation. Estimated RVSP 55 mm Hg.  Patient Profile     85 y.o. female with dementia, left hemiparesis from previous stroke, permanent atrial fibrillation with slow ventricular response, pulmonary artery hypertension, COPD and obstructive sleep apnea on chronic oxygen supplementation, has left thigh swelling and pain with suspicion for possible soft tissue abscess  Assessment &  Plan    1. Severe bradycardia: This is more pronounced today than it has been during her hospitalization, she remains asymptomatic, without evidence of tissue hypoperfusion.  Mentation appears unchanged, allowing for baseline dementia.  Normal renal parameters and no evidence of metabolic acidosis.  Discussed with EP and they will see her in person tomorrow, but agree that implantation of a permanent  pacemaker be hazardous in the setting of active soft tissue infection.  At this point, no clear indication for temporary pacing wire, but will add intravenous dopamine to see for heart rate in anticipation of any procedure to be performed on her thigh. 2.  Permanent atrial fibrillation: This has been present for many years and slow ventricular rate has been standard for, although it is more severe right now.  Pacemaker is indicated in a patient with atrial fibrillation and slow ventricular response when there are asymptomatic pauses in excess of 5 seconds or when the bradycardia is symptomatic. 3.  Anticoagulation: Eliquis currently on hold, on intravenous heparin. CHA2DS2VASc of 7, including history of stroke. 4. PAH: Echocardiogram 2019 showed normal LVEF 65-70% with minor valvular abnormalities systolic pulmonary artery pressure 55 mmHg.  Pulmonary hypertension related to chronic COPD. 5. COPD/OSA: On chronic oxygen.  To some degree, hypoventilation may explain her periods of bradycardia, although she clearly also has evidence of conduction. 6. CVA: Mild residual left hemiparesis from remote stroke  I believe palliative care consultation is appropriate before we proceed with invasive intervention such as pacemaker implantation.  For questions or updates, please contact [facility] Please consult [email or web address] for contact info under        Signed, Thurmon Fair, MD  06/15/2018, 8:34 AM

## 2018-06-16 DIAGNOSIS — R531 Weakness: Secondary | ICD-10-CM

## 2018-06-16 LAB — BASIC METABOLIC PANEL
Anion gap: 11 (ref 5–15)
BUN: 9 mg/dL (ref 8–23)
CO2: 23 mmol/L (ref 22–32)
Calcium: 8.6 mg/dL — ABNORMAL LOW (ref 8.9–10.3)
Chloride: 110 mmol/L (ref 98–111)
Creatinine, Ser: 0.6 mg/dL (ref 0.44–1.00)
GFR calc Af Amer: >60 mL/min (ref >60)
GFR calc non Af Amer: >60 mL/min (ref >60)
Glucose, Bld: 124 mg/dL — ABNORMAL HIGH (ref 70–99)
Potassium: 3.4 mmol/L — ABNORMAL LOW (ref 3.5–5.1)
Sodium: 144 mmol/L (ref 135–145)

## 2018-06-16 LAB — CBC
HCT: 48.9 % — ABNORMAL HIGH (ref 36.0–46.0)
Hemoglobin: 16 g/dL — ABNORMAL HIGH (ref 12.0–15.0)
MCH: 26.4 pg (ref 26.0–34.0)
MCHC: 32.7 g/dL (ref 30.0–36.0)
MCV: 80.7 fL (ref 80.0–100.0)
Platelets: UNDETERMINED 10*3/uL (ref 150–400)
RBC: 6.06 MIL/uL — ABNORMAL HIGH (ref 3.87–5.11)
RDW: 18.3 % — ABNORMAL HIGH (ref 11.5–15.5)
WBC: 9.4 10*3/uL (ref 4.0–10.5)
nRBC: 0 % (ref 0.0–0.2)

## 2018-06-16 LAB — CULTURE, BLOOD (ROUTINE X 2)
Culture: NO GROWTH
Special Requests: ADEQUATE

## 2018-06-16 LAB — PROTIME-INR
INR: 1.3 — ABNORMAL HIGH (ref 0.8–1.2)
Prothrombin Time: 16 seconds — ABNORMAL HIGH (ref 11.4–15.2)

## 2018-06-16 LAB — APTT: aPTT: 61 seconds — ABNORMAL HIGH (ref 24–36)

## 2018-06-16 LAB — T3, FREE: T3, Free: 2.1 pg/mL (ref 2.0–4.4)

## 2018-06-16 MED ORDER — POTASSIUM CHLORIDE CRYS ER 20 MEQ PO TBCR
40.0000 meq | EXTENDED_RELEASE_TABLET | Freq: Once | ORAL | Status: AC
  Administered 2018-06-16: 40 meq via ORAL
  Filled 2018-06-16: qty 2

## 2018-06-16 MED ORDER — ENOXAPARIN SODIUM 100 MG/ML ~~LOC~~ SOLN
85.0000 mg | Freq: Two times a day (BID) | SUBCUTANEOUS | Status: DC
  Administered 2018-06-16 – 2018-06-21 (×10): 85 mg via SUBCUTANEOUS
  Filled 2018-06-16 (×11): qty 1

## 2018-06-16 MED ORDER — POTASSIUM CHLORIDE CRYS ER 20 MEQ PO TBCR
20.0000 meq | EXTENDED_RELEASE_TABLET | Freq: Once | ORAL | Status: AC
  Administered 2018-06-16: 20 meq via ORAL
  Filled 2018-06-16: qty 1

## 2018-06-16 MED ORDER — SODIUM CHLORIDE 0.9% FLUSH
3.0000 mL | Freq: Two times a day (BID) | INTRAVENOUS | Status: DC
  Administered 2018-06-16 – 2018-06-20 (×8): 3 mL via INTRAVENOUS

## 2018-06-16 NOTE — Progress Notes (Signed)
Patient ID: Margaret Collier, female   DOB: 07-28-1931, 85 y.o.   MRN: [id number]  This NP visited patient at the bedside as a follow up to  yesterday's GOCs meeting.  Patient is alert and oriented x3 today and fully engaged appropriately in conversation regarding her current medical situation.  Conversation had today regarding diagnosis and prognosis, goals of care, end-of-life wishes, disposition and options.  Ms. Margaret Collier and I spoke frankly about her thoughts and feelings regarding human mortality and her wishes for herself at this time in her life trajectory.  She very frankly told me that she was afraid to die.  I suggested chaplain consult to help her explore these feelings and she was very receptive, placed consult to spiritual care.  We discussed what she views as quality of life; she wants independence and mobility.  We talked about the fact that she has been immobile since her stroke in November 2019 and the unlikelihood that she would regain mobility.  She verbalizes understanding, "it is just difficult to except"  Updated niece Ms. Margaret Collier/H POA  Questions  and concerns addressed.  Stressed with patient the importance of continued conversation with her family and her  medical providers regarding overall plan of care and treatment options,  ensuring decisions are within the context of her/ patient's values and GOCs.  Total time spent on the unit was 35 minutes  Greater than 50% of the time was spent in counseling and coordination of care  Lorinda Creed NP  Palliative Medicine Team Team Phone # [phone] Pager [phone]

## 2018-06-16 NOTE — Progress Notes (Signed)
ANTICOAGULATION CONSULT NOTE - Follow-Up Consult  Pharmacy Consult for Heparin Indication: atrial fibrillation (Eliquis on hold)  Patient Measurements: Height: 5' (152.4 cm) Weight: 185 lb 3 oz (84 kg) IBW/kg (Calculated) : 45.5 Heparin dosing weight 64.5kg  Vital Signs: Temp: 97.6 F (36.4 C) (05/10 2047) BP: 139/59 (05/10 2047) Pulse Rate: 45 (05/10 2047)  Labs: Recent Labs    06/13/18 0717  06/14/18 0217 06/14/18 1838 06/15/18 0443 06/15/18 0444 06/15/18 1449 06/16/18 0033  HGB 13.1  --  12.5  --   --  12.0  --   --   HCT 41.5  --  39.5  --   --  38.5  --   --   PLT 149*  --  149*  --   --  146*  --   --   APTT 170*   < > 43* 49* 50*  --  51* 61*  LABPROT  --   --   --   --  16.2*  --   --   --   INR  --   --   --   --  1.3*  --   --   --   HEPARINUNFRC >2.20*  --  >2.20* 1.76* 0.88*  --   --   --   CREATININE 0.93  --  0.76  --  0.70  --   --   --    < > = values in this interval not displayed.    Estimated Creatinine Clearance: 50.3 mL/min (by C-G formula based on SCr of 0.7 mg/dL).  Assessment: 85 yo F with left sided weakness, facial droop, slurred speech on apixaban PTA for AFib switched to heparin. Patient has left thigh abscess with planned incision and drainage 5/9. However, due to patient's low heart rate, surgery has been delayed and will reevaluate 5/10. Heparin was held 5/9 at 0000. Per Dr. Luisa Hart heparin will need to be held 6 hour before I&D procedure, not rescheduled yet.   5/11 AM update: aPTT is low at 61, no issues per RN.   Goal of Therapy:  Heparin level 0.3-0.7 units/ml aPTT 66 - 102 seconds Monitor platelets by anticoagulation protocol: Yes   Plan:  Cautiously increase Heparin to 900 units / hr Follow up aPTT/HL in 8 hours  Abran Duke, PharmD, BCPS Clinical Pharmacist Phone: [phone]

## 2018-06-16 NOTE — Plan of Care (Signed)

## 2018-06-16 NOTE — Consult Note (Signed)
Cardiology Consultation:   Patient ID: Margaret Collier MRN: [id number]; DOB: December 13, 1931  Admit date: 06/11/2018 Date of Consult: 06/16/2018  Primary Care Provider: System, Pcp Not In Primary Cardiologist: Merdis DelayALLYSON ELIZABETH BLAZEJEWSKI, PA-C  Primary Electrophysiologist:  None    Patient Profile:   Margaret Collier is a 85 y.o. female with a hx of permanent AFib, HTN, HLD, prior stroke, OSA on O2 at night , lymphedema, dementia, [facility], and reportedly bed-bound who is being seen today for the evaluation of bradycardia at the request of Dr. Royann Shiversroitoru  History of Present Illness:   Margaret Collier was [facility] with confusion, altered speech, CT brain noted no acute change, she was afebrile, bradycardic, X-rays of left leg were normal, but CT scan revealed 3.5 cm density concerning for abscess.  Patient was started on empiric antibiotics of vancomycin and Ancef.  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  EP is called to evaluate brdaycardia, possible need for PPM  LABS K+ 3.4 BUN/Creat 9/0.60  WBC 11.0 > 13.6 > 8.4 > 6.4 > 8.8 H/H 12/38 Plts 146 TSH 6.660 Mag 2.0  Blood cultures 5/6/204 One came STAPHYLOCOCCUS SPECIES (COAGULASE NEGATIVE)  The other same day remaieds negative 5 days  06/13/18 Both no growth 3 days so far  Given COVID 19, initial HPI via telephone by an APP has been implemented.  However  The patient has some underlying dementia, admitted with some degree of encephalopathy.   I have NOT called the patient to start the HPI remotely I have talked with her nurse this morning.  The patient is awake and alert, not lethargic, though oriented to self alone.  She asked the nurse tech to get her an item out of her kitchen. She ate with assistance given baseline L sided weakness, and reports also at baseline the patient incontinent of stool and urine.  Had no verbalized complaints this AM to staff.   Past Medical History:    Diagnosis Date   Atrial fibrillation (HCC)    Hyperlipidemia    Hypertension    OSA (obstructive sleep apnea)     Home Medications:  Prior to Admission medications   Medication Sig Start Date End Date Taking? Authorizing Provider  albuterol (VENTOLIN HFA) 108 (90 Base) MCG/ACT inhaler Inhale 2 puffs into the lungs every 4 (four) hours as needed for wheezing or shortness of breath.   Yes [provider]  apixaban (ELIQUIS) 5 MG TABS tablet Take 1 tablet (5 mg total) by mouth 2 (two) times daily. 01/28/18 06/11/18 Yes Jerald Kiefhiu, Stephen K, MD  atorvastatin (LIPITOR) 40 MG tablet Take 40 mg by mouth daily at 6 PM.    Yes [provider]  b complex vitamins capsule Take 1 capsule by mouth daily.   Yes [provider]  calcium carbonate (TUMS - DOSED IN MG ELEMENTAL CALCIUM) 500 MG chewable tablet Chew 1 tablet by mouth 3 (three) times daily before meals.   Yes [provider]  cephALEXin (KEFLEX) 500 MG capsule Take 500 mg by mouth every 8 (eight) hours.   Yes [provider]  chlorhexidine (PERIDEX) 0.12 % solution Use as directed 15 mLs in the mouth or throat 2 (two) times daily.   Yes [provider]  cholecalciferol (VITAMIN D3) 25 MCG (1000 UT) tablet Take 1,000 Units by mouth daily.   Yes [provider]  furosemide (LASIX) 40 MG tablet Take 40 mg by mouth daily.    Yes [provider]  furosemide (LASIX) 80 MG tablet Take 1 tablet (80 mg total) by mouth daily. Patient taking differently: Take 20 mg by mouth as needed for fluid or edema. May have every day PRN for wt gain of 3 lbs in one day or 5 lbs in a week 01/28/18 06/11/18 Yes Jerald Kief, MD  oxyCODONE-acetaminophen (PERCOCET) 10-325 MG tablet Take 1 tablet by mouth every 12 (twelve) hours. 01/28/18  Yes Jerald Kief, MD  OXYGEN Inhale 2 L/min into the lungs See admin instructions. Apply via nasal canula daily at bedtime and remove every morning   Yes [provider]  pantoprazole (PROTONIX) 40 MG tablet Take 40 mg by mouth daily at 6 (six) AM.    Yes [provider]  phenol (CHLORASEPTIC) 1.4 % LIQD Use as directed 2 sprays in the mouth or throat every 2 (two) hours as needed for throat irritation / pain. x48   Yes [provider]  polyethylene glycol (MIRALAX / GLYCOLAX) packet Take 17 g by mouth daily as needed for moderate constipation. 01/28/18  Yes Jerald Kief, MD  potassium chloride SA (K-DUR,KLOR-CON) 20 MEQ tablet Take 20 mEq by mouth daily.   Yes [provider]  pregabalin (LYRICA) 25 MG capsule Take 25 mg by mouth every 8 (eight) hours.    Yes [provider]  spironolactone (ALDACTONE) 25 MG tablet Take 0.5 tablets (12.5 mg total) by mouth daily. 01/29/18 06/11/18 Yes Jerald Kief, MD    Inpatient Medications: Scheduled Meds:  atorvastatin  40 mg Oral q1800   calcium carbonate  1 tablet Oral TID AC   chlorhexidine  15 mL Mouth/Throat BID   Chlorhexidine Gluconate Cloth  6 each Topical Q0600   mupirocin ointment  1 application Nasal BID   pantoprazole  40 mg Oral Q0600   pregabalin  25 mg Oral Q8H   sodium chloride flush  3 mL Intravenous Q12H   Continuous Infusions:  sodium chloride 250 mL (06/14/18 0402)    ceFAZolin (ANCEF) IV 1 g (06/16/18 0434)   dextrose 50 mL/hr at 06/16/18 0218   DOPamine 5 mcg/kg/min (06/15/18 1600)   heparin 900 Units/hr (06/16/18 0436)   vancomycin 1,500 mg (06/15/18 2210)   PRN Meds: sodium chloride, acetaminophen **OR** acetaminophen, albuterol, atropine, ondansetron **OR** ondansetron (ZOFRAN) IV, oxyCODONE, phenol  Allergies:    Allergies  Allergen Reactions   Zanaflex [Tizanidine Hcl] Shortness Of Breath, Other (See Comments) and Cough    Dehydration - reported by [facility] 10/07/13   Alprazolam Other (See Comments)    Dry mouth - reported by [facility] 05/13/14    Carisoprodol Other (See Comments)    Dizziness -  reported by [facility] 05/13/14    Chlorthalidone Other (See Comments)    syncope   Colesevelam Other (See Comments)    GI upset and stool change - reported by [facility] 05/13/14    Dilaudid [Hydromorphone Hcl] Hives and Swelling   Duloxetine Other (See Comments)    Dizziness - reported by [facility] 05/13/14    Felodipine Other (See Comments)    Dizziness, groin pain - reported by [facility] 05/13/14    Fosamax [Alendronate Sodium] Nausea And Vomiting and Other (See Comments)    Back and hip pain   Furosemide Other (See Comments)    Dry mouth/dizziness - reported by [facility] 05/13/14    Gabapentin Other (See Comments)    Insomnia - reported by [facility]  05/13/14    Ibuprofen Other (See Comments)    GI pain - reported by [facility] 05/13/14    Lovastatin Other (See Comments)    Weakness - reported by [facility] 05/13/14    Meclizine Other (See Comments)    Sleeplessness - reported by [facility] 05/13/14    Meperidine Other (See Comments)    Jitteriness - reported by [facility] 05/13/14    Methadone Other (See Comments)    Stool impaction - reported by [facility] 05/13/14    Morphine Other (See Comments)    Dizziness - reported by [facility] 05/13/14    Niacin Other (See Comments)    Flushing - reported by [facility] 05/13/14    Omeprazole Other (See Comments)    Sleeplessness - reported by [facility] 05/13/14    Oxycodone Other (See Comments)    Groin pain - reported by [facility] 05/13/14    Pravastatin Other (See Comments)    Weakness - reported by [facility] 05/13/14    Simvastatin Other (See Comments)    Weakness - reported by [facility] 05/13/14    Statins Other (See Comments)    Myalgia, weakness - reported by [facility] 05/13/14   Tramadol Other (See Comments)    Nervous - reported by [facility] 05/13/14    Valsartan Other (See Comments)    Dizziness - reported by [facility] 05/13/14    Hydrocodone-Acetaminophen Nausea And Vomiting    reported by [facility] 05/13/14     Social History:   Social History   Socioeconomic History   Marital status: Single    Spouse name: Not on file   Number of children: Not on file   Years of education: Not on file   Highest education level: Not on file  Occupational History   Not on file  Social Needs   Financial resource strain: Not on file   Food insecurity:    Worry: Not on file    Inability: Not on file   Transportation needs:    Medical: Not on file    Non-medical: Not on file  Tobacco Use   Smoking status: Never Smoker   Smokeless tobacco: Never Used  Substance and Sexual Activity   Alcohol use: Not on file   Drug use: Not on file   Sexual activity: Not on file  Lifestyle   Physical activity:    Days per week: Not on file    Minutes per session: Not on file   Stress: Not on file  Relationships   Social connections:    Talks on phone: Not on file    Gets together: Not on file    Attends religious service: Not on file    Active member of club or organization: Not on file    Attends meetings of clubs or organizations: Not on file    Relationship status: Not on file   Intimate partner violence:    Fear of current or ex partner: Not on file    Emotionally abused: Not on file    Physically abused: Not on file    Forced sexual activity: Not on file  Other Topics Concern   Not on file  Social History Narrative   Not on file    Family History:   Family History  Problem Relation Age of Onset   Heart disease Mother      ROS:  Please see the history of present illness. All other ROS reviewed and negative.     Physical Exam/Data:   Vitals:   06/15/18 1132 06/15/18 1400 06/15/18 2047 06/16/18 0538  BP: (!) 130/56 (!) 126/54 (!) 139/59 (!) 149/66  Pulse: (!) 50 (!) 42 (!) 45 (!) 49  Resp:   20 18  Temp:   97.6 F (36.4 C) 97.9 F (36.6 C)  TempSrc:    Oral    SpO2: 100% 100% 100% 99%  Weight:    85.1 kg  Height:        Intake/Output Summary (Last 24 hours) at 06/16/2018 0852 Last data filed at 06/16/2018 0735 Gross per 24 hour  Intake 2899.72 ml  Output 2152 ml  Net 747.72 ml   Last 3 Weights 06/16/2018 06/15/2018 06/14/2018  Weight (lbs) 187 lb 9.6 oz 185 lb 3 oz 188 lb 0.8 oz  Weight (kg) 85.095 kg 84 kg 85.3 kg     Body mass index is 36.64 kg/m.    General:  Well nourished, well developed, in no acute distress HEENT: normal Lymph: no adenopathy Neck: no JVD Endocrine:  No thryomegaly Vascular: No carotid bruits  Cardiac:   irreg-irreg, bradycardiac Lungs:   normal WOB  Abd:  not examined  Ext:  no edema Musculoskeletal:   No deformities Skin: warm and dry,   L thigh abscess  Neuro:   known L sided weakness Psych:  Normal affect   EKG:  The EKG was personally reviewed and demonstrates:    Afib 36bpm, baseline artifact AFib 46, LAD, PVC  Telemetry:  Telemetry was personally reviewed and demonstrates:   AFib 35-40's  Relevant CV Studies:  Echo report October 07, 2017 [place] Normal left ventricular size and systolic function with no appreciable segmental abnormality. Ejection fraction is estimated at 65-70% Diastolic function appears indeterminate Normal left ventricular wall thickness Mild mitral annular calcification. Mild mitral regurgitation. Mild aortic regurgitation. Estimated RVSP 55 mm Hg.  Laboratory Data:  Chemistry Recent Labs  Lab 06/14/18 0217 06/15/18 0443 06/16/18 0716  NA 146* 140 144  K 3.6 3.8 3.4*  CL 110 106 110  CO2 GLUCOSE 76 121* 124*  BUN CREATININE 0.76 0.70 0.60  CALCIUM 8.8* 8.6* 8.6*  GFRNONAA >60 >60 >60  GFRAA >60 >60 >60  ANIONGAP Recent Labs  Lab 06/11/18 0918  PROT 7.1  ALBUMIN 3.3*  AST 31  ALT 22  ALKPHOS 128*  BILITOT 0.9   Hematology Recent Labs  Lab 06/13/18 0717 06/14/18 0217 06/15/18 0444  WBC 8.4 6.4 8.8  RBC  5.07 4.84 4.71  HGB 13.1 12.5 12.0  HCT 41.5 39.5 38.5  MCV 81.9 81.6 81.7  MCH 25.8* 25.8* 25.5*  MCHC 31.6 31.6 31.2  RDW 17.2* 17.3* 17.3*  PLT 149* 149* 146*   Cardiac EnzymesNo results for input(s): TROPONINI in the last 168 hours. No results for input(s): TROPIPOC in the last 168 hours.  BNPNo results for input(s): BNP, PROBNP in the last 168 hours.  DDimer No results for input(s): DDIMER in the last 168 hours.  Radiology/Studies:   [place] Lt Lower Extrem Ltd Soft Tissue Non Vascular Result Date: 06/14/2018 CLINICAL DATA:  Superficial medial left thigh abscess by CT EXAM: ULTRASOUND LEFT LOWER EXTREMITY LIMITED TECHNIQUE: Ultrasound examination of the lower extremity soft tissues was performed in the area of clinical concern. COMPARISON:  06/11/2018 CT FINDINGS: Medial left thigh area of concern  demonstrates localized skin thickening. Deep to the skin, there is a superficial subcutaneous complex fluid collection measuring 1.6 x 2.0 x 3.4 cm. Internal contents are heterogeneous and hypoechoic. This correlates with the CT finding. IMPRESSION: 3.4 cm left medial thigh subcutaneous complex fluid collection compatible with abscess. Electronically Signed   By: Judie Petit.  Shick M.D.   On: 06/14/2018 14:31    Assessment and Plan:   1. Marked bradycardia     Started on dopamine yesterday with further slowing with rates dipping to 20's     HR generally high 30's-low 40's since     BP stable     Mild TSH elevation, 6.6     No nodal blocking agents here or at home  She has h/o bradycardia, asymptomatic described as nocturnal in prior hospital stays In review of record, she has h/o stroke, sounds essentially bed-bound  High risk of infection complications with PPM given active skin infection currently, not ideal at all.     Initial set of blood culties 1 of 2 positive,, the second of the 2 remained neg for 5 days Send set of [facility] are both neg today is 3 days    2. Thigh abscess, cellulitis       Initially planned for I/D, though cancelled 2/2 bradycardia      Last surgery note, reports spontaneous drainage with some improving in appearance, to continue antibiotics.  Initial blood cultures 1 of 2 positive for coag neg staph (the second remains negative to date -5 days) Medicine notes discussion with ID, probable contaminate, to repeat, so far repeat BC neg (x2) 3 days  C/w surgery, medicine teams   3. Permanent AFib      CHA2DS2Vasc is 6, on Eliquis, appropriately dosed for weight/Creat     Transitioned to heparin gtt in anticipation of thigh surgery (subsequently canceled)  4. Encephalopathy     apparently some naseline dementia as well                For questions or updates, please contact [facility] Please consult [email or web address] for contact info under     Signed, Sheilah Pigeon, PA-C  06/16/2018 8:52 AM  I have seen and examined this patient with Francis Dowse.  Agree with above, note added to reflect my findings.  On exam, cardiac, irregular, no murmurs, lungs clear.  Patient with a thigh abscess.  Prior to surgery for the abscess, she was noted to be significantly bradycardic.  She does have a history of dementia as well as permanent atrial fibrillation and CVA.  Her bradycardia has been longstanding noted significant bradycardia while sleeping.  She is currently bedbound, and per her family has not been out of bed recently, and possibly she is 100% bedbound.  Fortunately, her abscess has started draining and no surgical intervention is needed.  She is on low-dose dopamine for heart rate support.  I am unclear if she is symptomatic at all from her bradycardia.  We Desi Rowe plan to hold off on dopamine infusion tomorrow to reassess her heart rate.  If she remains asymptomatic, she may not require pacemaker implantation.  I did discuss this with her niece who agrees that if she is asymptomatic pacemaker may not be necessary.  Khalil Belote M. Velva Molinari MD 06/16/2018 12:31 PM

## 2018-06-16 NOTE — Final Consult Note (Signed)
Consultant Final Sign-Off Note    Assessment/Final recommendations  Margaret Collier is a 85 y.o. female followed by me for L thigh abscess/cellulitis. Patient is not a surgical candidate. Patient seen by IR for possible aspiration but this was delayed secondary to bradycardia. Palliative care also following. Currently abscess is spontaneously draining and cellulitis seems to be improving with conservative management. Would not recommend surgical debridement at this time with collection actively draining.    Wound care (if applicable): Apply dressing to draining area BID after cleansing area. Patient wound benefit from being able to get in the shower and allowing warm water and soap to cleanse area if able.   Diet at discharge: per primary team   Activity at discharge: per primary team   Follow-up appointment: wound care center   Pending results:  Unresulted Labs (From admission, onward)    Start     Ordered   06/16/18 1000  Heparin level (unfractionated)  Once-Timed,   R    Question:  Specimen collection method  Answer:  Lab=Lab collect   06/16/18 0130   06/16/18 1000  APTT  Once-Timed,   R    Question:  Specimen collection method  Answer:  Lab=Lab collect   06/16/18 0130   06/16/18 0714  Phosphorus  Once,   R    Question:  Specimen collection method  Answer:  Lab=Lab collect   06/16/18 0713   06/13/18 0500  CBC  Daily,   R     06/12/18 1241           Medication recommendations: Consider MRSA coverage for abx   Other recommendations: Could try to swab purulence that is draining for culture - no culture swabs were available on the floor at time of my exam.     Thank you for allowing [place] to participate in the care of your patient!  Please consult [place] again if you have further needs for your patient.  Emerald Gehres Rayburn 06/16/2018 9:21 AM    Subjective   Pain in L thigh. When asked if pain and discomfort were improved from admission, patient stated "nothing ever gets better".    Objective  Vital signs in last 24 hours: Temp:  [97.6 F (36.4 C)-97.9 F (36.6 C)] 97.9 F (36.6 C) (05/11 0538) Pulse Rate:  [42-50] 49 (05/11 0538) Resp:  [18-20] 18 (05/11 0538) BP: (126-149)/(54-66) 149/66 (05/11 0538) SpO2:  [99 %-100 %] 99 % (05/11 0538) Weight:  [85.1 kg] 85.1 kg (05/11 0538)  Gen:  Alert, NAD HEENT: EOM's intact, pupils equal and round Card:  bradycardic Pulm:  effort normal Abd: Soft, NT/ND, +BS, no HSM Left thigh: mid/medial left thigh abscess with active purulent drainage, persistent cellulitis but is improved since admission Skin: warm and dry   Pertinent labs and Studies: Recent Labs    06/14/18 0217 06/15/18 0444  WBC 6.4 8.8  HGB 12.5 12.0  HCT 39.5 38.5   BMET Recent Labs    06/15/18 0443 06/16/18 0716  NA 140 144  K 3.8 3.4*  CL 106 110  CO2 22 23  GLUCOSE 121* 124*  BUN 17 9  CREATININE 0.70 0.60  CALCIUM 8.6* 8.6*   No results for input(s): LABURIN in the last 72 hours. Results for orders placed or performed during the hospital encounter of 06/11/18  Culture, blood (routine x 2)     Status: Abnormal   Collection Time: 06/11/18  9:18 AM  Result Value Ref Range Status   Specimen Description BLOOD RIGHT ANTECUBITAL  Final   Special Requests   Final    BOTTLES DRAWN AEROBIC AND ANAEROBIC Blood Culture adequate volume   Culture  Setup Time   Final    GRAM POSITIVE COCCI ANAEROBIC BOTTLE ONLY CRITICAL RESULT CALLED TO, READ BACK BY AND VERIFIED WITH: R. FANNING PHARMD, AT 1334 06/12/18 BY D. VANHOOK    Culture (A)  Final    STAPHYLOCOCCUS SPECIES (COAGULASE NEGATIVE) THE SIGNIFICANCE OF ISOLATING THIS ORGANISM FROM A SINGLE SET OF BLOOD CULTURES WHEN MULTIPLE SETS ARE DRAWN IS UNCERTAIN. PLEASE NOTIFY THE MICROBIOLOGY DEPARTMENT WITHIN ONE WEEK IF SPECIATION AND SENSITIVITIES ARE REQUIRED. Performed at [facility], [address], [place], [place] [address]    Report Status 06/14/2018 FINAL  Final  Culture, blood  (routine x 2)     Status: None   Collection Time: 06/11/18  9:18 AM  Result Value Ref Range Status   Specimen Description BLOOD BLOOD RIGHT FOREARM  Final   Special Requests   Final    BOTTLES DRAWN AEROBIC AND ANAEROBIC Blood Culture adequate volume   Culture   Final    NO GROWTH 5 DAYS Performed at [facility], [address], [place], [place] [address]    Report Status 06/16/2018 FINAL  Final  SARS Coronavirus 2 (CEPHEID - Performed in [facility] hospital lab), Hosp Order     Status: None   Collection Time: 06/11/18  9:18 AM  Result Value Ref Range Status   SARS Coronavirus 2 NEGATIVE NEGATIVE Final    Comment: (NOTE) If result is NEGATIVE SARS-CoV-2 target nucleic acids are NOT DETECTED. The SARS-CoV-2 RNA is generally detectable in upper and lower  respiratory specimens during the acute phase of infection. The lowest  concentration of SARS-CoV-2 viral copies this assay can detect is 250  copies / mL. A negative result does not preclude SARS-CoV-2 infection  and should not be used as the sole basis for treatment or other  patient management decisions.  A negative result may occur with  improper specimen collection / handling, submission of specimen other  than nasopharyngeal swab, presence of viral mutation(s) within the  areas targeted by this assay, and inadequate number of viral copies  (<250 copies / mL). A negative result must be combined with clinical  observations, patient history, and epidemiological information. If result is POSITIVE SARS-CoV-2 target nucleic acids are DETECTED. The SARS-CoV-2 RNA is generally detectable in upper and lower  respiratory specimens dur ing the acute phase of infection.  Positive  results are indicative of active infection with SARS-CoV-2.  Clinical  correlation with patient history and other diagnostic information is  necessary to determine patient infection status.  Positive results do  not rule out bacterial infection or  co-infection with other viruses. If result is PRESUMPTIVE POSTIVE SARS-CoV-2 nucleic acids MAY BE PRESENT.   A presumptive positive result was obtained on the submitted specimen  and confirmed on repeat testing.  While 2019 novel coronavirus  (SARS-CoV-2) nucleic acids may be present in the submitted sample  additional confirmatory testing may be necessary for epidemiological  and / or clinical management purposes  to differentiate between  SARS-CoV-2 and other Sarbecovirus currently known to infect humans.  If clinically indicated additional testing with an alternate test  methodology [phone]) is advised. The SARS-CoV-2 RNA is generally  detectable in upper and lower respiratory sp ecimens during the acute  phase of infection. The expected result is Negative. Fact Sheet for Patients:  BoilerBrush.com.cy Fact Sheet for Healthcare  Providers: https://pope.com/ This test is not yet approved or cleared by the [place] and has been authorized for detection and/or diagnosis of SARS-CoV-2 by FDA under an Emergency Use Authorization (EUA).  This EUA will remain in effect (meaning this test can be used) for the duration of the COVID-19 declaration under Section 564(b)(1) of the Act, 21 U.S.C. section 360bbb-3(b)(1), unless the authorization is terminated or revoked sooner. Performed at [facility], [address], [place], [place] [address]   Blood Culture ID Panel (Reflexed)     Status: Abnormal   Collection Time: 06/11/18  9:18 AM  Result Value Ref Range Status   Enterococcus species NOT DETECTED NOT DETECTED Final   Listeria monocytogenes NOT DETECTED NOT DETECTED Final   Staphylococcus species DETECTED (A) NOT DETECTED Final    Comment: Methicillin (oxacillin) susceptible coagulase negative staphylococcus. Possible blood culture contaminant (unless isolated from more than one blood culture draw or clinical case suggests  pathogenicity). No antibiotic treatment is indicated for blood  culture contaminants. CRITICAL RESULT CALLED TO, READ BACK BY AND VERIFIED WITH: R. FANNING PHARMD, AT 1334 06/12/18 BY D. VANHOOK    Staphylococcus aureus (BCID) NOT DETECTED NOT DETECTED Final   Methicillin resistance NOT DETECTED NOT DETECTED Final   Streptococcus species NOT DETECTED NOT DETECTED Final   Streptococcus agalactiae NOT DETECTED NOT DETECTED Final   Streptococcus pneumoniae NOT DETECTED NOT DETECTED Final   Streptococcus pyogenes NOT DETECTED NOT DETECTED Final   Acinetobacter baumannii NOT DETECTED NOT DETECTED Final   Enterobacteriaceae species NOT DETECTED NOT DETECTED Final   Enterobacter cloacae complex NOT DETECTED NOT DETECTED Final   Escherichia coli NOT DETECTED NOT DETECTED Final   Klebsiella oxytoca NOT DETECTED NOT DETECTED Final   Klebsiella pneumoniae NOT DETECTED NOT DETECTED Final   Proteus species NOT DETECTED NOT DETECTED Final   Serratia marcescens NOT DETECTED NOT DETECTED Final   Haemophilus influenzae NOT DETECTED NOT DETECTED Final   Neisseria meningitidis NOT DETECTED NOT DETECTED Final   Pseudomonas aeruginosa NOT DETECTED NOT DETECTED Final   Candida albicans NOT DETECTED NOT DETECTED Final   Candida glabrata NOT DETECTED NOT DETECTED Final   Candida krusei NOT DETECTED NOT DETECTED Final   Candida parapsilosis NOT DETECTED NOT DETECTED Final   Candida tropicalis NOT DETECTED NOT DETECTED Final    Comment: Performed at [facility], [address], [place], [place] [address]  MRSA PCR Screening     Status: Abnormal   Collection Time: 06/11/18 11:55 PM  Result Value Ref Range Status   MRSA by PCR POSITIVE (A) NEGATIVE Final    Comment:        The GeneXpert MRSA Assay (FDA approved for NASAL specimens only), is one component of a comprehensive MRSA colonization surveillance program. It is not intended to diagnose MRSA infection nor to guide or monitor treatment  for MRSA infections. RESULT CALLED TO, READ BACK BY AND VERIFIED WITH: L. Sheliah Plane [id number] 06/12/2018 Girtha Hake Performed at [facility], [address], [place], [place] [address]   Culture, blood (routine x 2)     Status: None (Preliminary result)   Collection Time: 06/13/18  2:56 PM  Result Value Ref Range Status   Specimen Description BLOOD RIGHT HAND  Final   Special Requests   Final    BOTTLES DRAWN AEROBIC AND ANAEROBIC Blood Culture adequate volume   Culture   Final    NO GROWTH 3 DAYS Performed at [facility], [address] [place], [place] [address]    Report Status PENDING  Incomplete  Culture, blood (routine x 2)     Status: None (Preliminary result)   Collection Time: 06/13/18  3:06 PM  Result Value Ref Range Status   Specimen Description BLOOD LEFT HAND  Final   Special Requests   Final    BOTTLES DRAWN AEROBIC AND ANAEROBIC Blood Culture adequate volume   Culture   Final    NO GROWTH 3 DAYS Performed at [facility], [address] [place], [place] [address]    Report Status PENDING  Incomplete  Surgical pcr screen     Status: Abnormal   Collection Time: 06/14/18  1:25 AM  Result Value Ref Range Status   MRSA, PCR POSITIVE (A) NEGATIVE Final    Comment: CRITICAL RESULT CALLED TO, READ BACK BY AND VERIFIED WITH: RN Lucrezia EuropeH. HAMLIN [id number] @0322  THANEY     Staphylococcus aureus POSITIVE (A) NEGATIVE Final    Comment: CRITICAL RESULT CALLED TO, READ BACK BY AND VERIFIED WITH: RN Lucrezia EuropeH. HAMLIN [id number] @0322  THANEY Performed at [facility], [address] [place], [place] [address]     Imaging: No results found.

## 2018-06-16 NOTE — Consult Note (Signed)
Contacted patient's niece by phone per primary team request. She had questions regarding patient's recent behavior. 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 Explained to patient's niece that infections can cause mental status changes/behavioral changes and that is a possible reason that patient made hurtful statements to her niece. Provided support to her niece and recommended that she contact her aunt again after her mental status improves. She was advised to contact patient's nurse to receive updates about her condition. She had no further concerns and was appreciative of the phone call.   Juanetta Beets, DO 06/16/18 6:50 PM

## 2018-06-16 NOTE — Progress Notes (Addendum)
PROGRESS NOTE    Margaret Collier  BJY:[id number] DOB: 1931/02/20 DOA: 06/11/2018 PCP: System, Pcp Not In    Brief Narrative 85 year old with past medical history significant for hypertension, hyperlipidemia, chronic A. fib on Eliquis oxygen dependent at night 2 L, CVA with residual left-sided weakness, OSA who presents complaining with worsening confusion and a speech problem.  History has been limited to get from the patient due to history of dementia and report of increased; confusion. Patient is bedbound at baseline, at facility they use a Hoyer lift to get her up in the chair. Evaluation and admission: A CT head showed areas of previous stroke.  CT scan left femur showed 3.5 cm abscess left mid thigh. Patient also was noted to be bradycardic with heart rate in the 40s.  Patient was refusing care, and surgery. She has been confused.  She was evaluated by Psychiatrist, who recommended that patient lack capacity to consent for surgery or refuse medical care. Surgery was consulted, patient will have I and D done tomorrow.   Blood culture 1 out 4 bottle grew Staph coagulase negative. Probably a contaminant. Repeated Blood culture 5-08 no growth to date.   EP consulted for evaluation of pacemaker for bradycardia.   Assessment & Plan:   Principal Problem:   Evaluation by psychiatric service required Active Problems:   Atrial fibrillation (HCC)   OSA (obstructive sleep apnea)   History of CVA with residual deficit   Bradycardia   Cellulitis and abscess of left leg   AKI (acute kidney injury) (HCC)   Thrombocytopenia (HCC)   Adult failure to thrive   Abscess of left thigh   DNR (do not resuscitate) discussion   Palliative care by specialist  1-Left thigh abscess; cellulitis Patient present with confusion, leukocytosis, worsening redness of left lower extremity.  She failed oral antibiotic. -CT showed focal area of abscess. -Per Psych; Patient lack capacity to consent for surgery or  refusing medical care.  -surgery  Cancel today due to decreased hr overnight. Patient hr in the 30 on 5/09. -IR consulted after I discussed case with Dr Luisa Hart on 5-09.  For evaluation of US guide aspiration.  -Continue with IV antibiotics.  -Will consult palliative care for goals of care.  -As of today 5-10 per surgery note abscess draining on it own, continue with antibiotics. Await for HR stabilization. -5-11 per surgery per surgery abscess is spontaneously draining , no need for debridement or IR aspiration. Antibiotics coverage for MRSA.  -on ancef and vancomycin.   Blood culture positive 1--4 bottle. Discussed with ID, Dr Luciana Axe probably a contaminant. No need to get 2 ECHO. He agree with repeating blood cultures.  Staph, coagulase negative.  On IV vancomycin. Repeated blood cultures 5-08; No growth to date.   2-Acute metabolic encephalopathy; She  was transferred from her facility with increased confusion and questionable worsening left side weakness. CT head was negative. Psychiatric consulted for evaluation for capacity, patient lacks capacity to consent for surgery or refusing medical care.   3-Bradycardia, asymptomatic -5-09; patient with episodes of bradycardia overnight 5-09, and pauses. Cardiology consulted again. See Dr Royann Shivers recommendations. No need for pacemaker at this time and implementing pacemaker with active infection is contraindicated.  -5-10; HR lower this am in the 20. Dr Edman Circle started Dopamine. -He also consulted EP for evaluation, but likely not good idea to place pacemaker with active infection.  -Elevated TSH, but Free T 4 normal, and  free T 3 normal.  -Per EP; plan is  to hold Dopamine to reassess HR>   4-Permanent A. fib on Eliquis: Eliquis on hold due to possible surgical procedure. Cardiology  recommending heparin.  refuse labs, change heparin to lovenox.   5-History of dementia 6-mild chronic thrombocytopenia Stable.   7-AKI: Prior  creatinine around 0.8.  Suspect prerenal.  Continue with IV fluids.  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  9-Constipation; having multiples BM, stop laxatives.   10-Chronic diastolic heart failure: Holding Lasix due to AKI and infection.  11-history of CVA with residual left-sided weakness, neuropathy: Repeated CT head negative.  12-OSA: Continue with 2 L of oxygen at night.  Hypokalemia; replete.   Estimated body mass index is 36.64 kg/m as calculated from the following:   Height as of this encounter: 5' (1.524 m).   Weight as of this encounter: 85.1 kg.   DVT prophylaxis: Was on Eliquis prior to admission, currently on hold due to possible surgical procedure Code Status: Full code Family Communication;  Niece updated 5-10. Disposition Plan: Patient needs to remain in the hospital for IV antibiotics, and further evaluation of bradycardia.   Consultants:   Cardiology  Surgery    Procedures:   none   Antimicrobials:   Ancef 5-06  Vancomycin 5-06   Subjective: She is alert, require assistance with breakfast.  Report pain in her leg.   Objective: Vitals:   06/15/18 1400 06/15/18 2047 06/16/18 0538 06/16/18 1324  BP: (!) 126/54 (!) 139/59 (!) 149/66 (!) 144/68  Pulse: (!) 42 (!) 45 (!) 49 (!) 46  Resp:  Temp:  97.6 F (36.4 C) 97.9 F (36.6 C) (!) 97.4 F (36.3 C)  TempSrc:   Oral Oral  SpO2: 100% 100% 99%   Weight:   85.1 kg   Height:        Intake/Output Summary (Last 24 hours) at 06/16/2018 1358 Last data filed at 06/16/2018 1328 Gross per 24 hour  Intake 2465.33 ml  Output 3152 ml  Net -686.67 ml   Filed Weights   06/14/18 0100 06/15/18 0524 06/16/18 0538  Weight: 85.3 kg 84 kg 85.1 kg    Examination:  General exam: NAD Respiratory system: CTA Cardiovascular system: S 1, S 2 IRR Gastrointestinal system: BS present, soft, nt Central nervous system: Chronic left  side weakness and contraction.  Extremities: right leg with skin tear cover.  Skin: left thing with less redness, draining purulent material.     Data Reviewed: I have personally reviewed following labs and imaging studies  CBC: Recent Labs  Lab 06/11/18 0918 06/12/18 0542 06/13/18 0717 06/14/18 0217 06/15/18 0444 06/16/18 0716  WBC 11.0* 13.6* 8.4 6.4 8.8 9.4  NEUTROABS 7.8*  --   --   --   --   --   HGB 13.7 12.6 13.1 12.5 12.0 16.0*  HCT 44.3 40.2 41.5 39.5 38.5 48.9*  MCV 83.4 82.2 81.9 81.6 81.7 80.7  PLT 149* 146* 149* 149* 146* PLATELET CLUMPS NOTED ON SMEAR, UNABLE TO ESTIMATE   Basic Metabolic Panel: Recent Labs  Lab 06/12/18 0542 06/13/18 0717 06/14/18 0217 06/14/18 0223 06/15/18 0443 06/16/18 0716  NA 141 145 146*  --  140 144  K 4.3 4.0 3.6  --  3.8 3.4*  CL 105 107 110  --  106 110  CO2 --  22 23  GLUCOSE 90 81 76  --  121* 124*  BUN 37* 27* 19  --  17 9  CREATININE 1.14* 0.93 0.76  --  0.70 0.60  CALCIUM 9.0 9.2 8.8*  --  8.6* 8.6*  MG 2.1  --   --  2.0  --   --    GFR: Estimated Creatinine Clearance: 50.7 mL/min (by C-G formula based on SCr of 0.6 mg/dL). Liver Function Tests: Recent Labs  Lab 06/11/18 0918  AST 31  ALT 22  ALKPHOS 128*  BILITOT 0.9  PROT 7.1  ALBUMIN 3.3*   No results for input(s): LIPASE, AMYLASE in the last 168 hours. No results for input(s): AMMONIA in the last 168 hours. Coagulation Profile: Recent Labs  Lab 06/11/18 0918 06/12/18 0542 06/15/18 0443 06/16/18 0716  INR 2.1* 2.2* 1.3* 1.3*   Cardiac Enzymes: No results for input(s): CKTOTAL, CKMB, CKMBINDEX, TROPONINI in the last 168 hours. BNP (last 3 results) No results for input(s): PROBNP in the last 8760 hours. HbA1C: No results for input(s): HGBA1C in the last 72 hours. CBG: Recent Labs  Lab 06/14/18 0541 06/14/18 1014 06/15/18 0839  GLUCAP 88 89 136*   Lipid Profile: No results for input(s): CHOL, HDL, LDLCALC, TRIG, CHOLHDL,  LDLDIRECT in the last 72 hours. Thyroid Function Tests: Recent Labs    06/15/18 0443 06/15/18 0444  TSH 6.660*  --   FREET4  --  0.95  T3FREE  --  2.1   Anemia Panel: No results for input(s): VITAMINB12, FOLATE, FERRITIN, TIBC, IRON, RETICCTPCT in the last 72 hours. Sepsis Labs: Recent Labs  Lab 06/11/18 [id number] 06/11/18 1113  LATICACIDVEN 0.9 1.0    Recent Results (from the past 240 hour(s))  Culture, blood (routine x 2)     Status: Abnormal   Collection Time: 06/11/18  9:18 AM  Result Value Ref Range Status   Specimen Description BLOOD RIGHT ANTECUBITAL  Final   Special Requests   Final    BOTTLES DRAWN AEROBIC AND ANAEROBIC Blood Culture adequate volume   Culture  Setup Time   Final    GRAM POSITIVE COCCI ANAEROBIC BOTTLE ONLY CRITICAL RESULT CALLED TO, READ BACK BY AND VERIFIED WITH: R. FANNING PHARMD, AT 1334 06/12/18 BY D. VANHOOK    Culture (A)  Final    STAPHYLOCOCCUS SPECIES (COAGULASE NEGATIVE) THE SIGNIFICANCE OF ISOLATING THIS ORGANISM FROM A SINGLE SET OF BLOOD CULTURES WHEN MULTIPLE SETS ARE DRAWN IS UNCERTAIN. PLEASE NOTIFY THE MICROBIOLOGY DEPARTMENT WITHIN ONE WEEK IF SPECIATION AND SENSITIVITIES ARE REQUIRED. Performed at [facility], [address] [place], [place] [address]    Report Status 06/14/2018 FINAL  Final  Culture, blood (routine x 2)     Status: None   Collection Time: 06/11/18  9:18 AM  Result Value Ref Range Status   Specimen Description BLOOD BLOOD RIGHT FOREARM  Final   Special Requests   Final    BOTTLES DRAWN AEROBIC AND ANAEROBIC Blood Culture adequate volume   Culture   Final    NO GROWTH 5 DAYS Performed at [facility], [address] [place], [place] [address]    Report Status 06/16/2018 FINAL  Final  SARS Coronavirus 2 (CEPHEID - Performed in [facility] hospital lab), Hosp Order     Status: None   Collection Time: 06/11/18  9:18 AM  Result Value Ref Range Status   SARS Coronavirus 2 NEGATIVE NEGATIVE Final     Comment: (NOTE) If result is NEGATIVE SARS-CoV-2 target nucleic acids are NOT DETECTED. The SARS-CoV-2 RNA is generally detectable in upper and lower  respiratory specimens during  the acute phase of infection. The lowest  concentration of SARS-CoV-2 viral copies this assay can detect is 250  copies / mL. A negative result does not preclude SARS-CoV-2 infection  and should not be used as the sole basis for treatment or other  patient management decisions.  A negative result may occur with  improper specimen collection / handling, submission of specimen other  than nasopharyngeal swab, presence of viral mutation(s) within the  areas targeted by this assay, and inadequate number of viral copies  (<250 copies / mL). A negative result must be combined with clinical  observations, patient history, and epidemiological information. If result is POSITIVE SARS-CoV-2 target nucleic acids are DETECTED. The SARS-CoV-2 RNA is generally detectable in upper and lower  respiratory specimens dur ing the acute phase of infection.  Positive  results are indicative of active infection with SARS-CoV-2.  Clinical  correlation with patient history and other diagnostic information is  necessary to determine patient infection status.  Positive results do  not rule out bacterial infection or co-infection with other viruses. If result is PRESUMPTIVE POSTIVE SARS-CoV-2 nucleic acids MAY BE PRESENT.   A presumptive positive result was obtained on the submitted specimen  and confirmed on repeat testing.  While 2019 novel coronavirus  (SARS-CoV-2) nucleic acids may be present in the submitted sample  additional confirmatory testing may be necessary for epidemiological  and / or clinical management purposes  to differentiate between  SARS-CoV-2 and other Sarbecovirus currently known to infect humans.  If clinically indicated additional testing with an alternate test  methodology [phone]) is advised. The SARS-CoV-2  RNA is generally  detectable in upper and lower respiratory sp ecimens during the acute  phase of infection. The expected result is Negative. Fact Sheet for Patients:  BoilerBrush.com.cy Fact Sheet for Healthcare Providers: https://pope.com/ This test is not yet approved or cleared by the [place] FDA and has been authorized for detection and/or diagnosis of SARS-CoV-2 by FDA under an Emergency Use Authorization (EUA).  This EUA will remain in effect (meaning this test can be used) for the duration of the COVID-19 declaration under Section 564(b)(1) of the Act, 21 U.S.C. section 360bbb-3(b)(1), unless the authorization is terminated or revoked sooner. Performed at [facility], [address], [place], [place] [address]   Blood Culture ID Panel (Reflexed)     Status: Abnormal   Collection Time: 06/11/18  9:18 AM  Result Value Ref Range Status   Enterococcus species NOT DETECTED NOT DETECTED Final   Listeria monocytogenes NOT DETECTED NOT DETECTED Final   Staphylococcus species DETECTED (A) NOT DETECTED Final    Comment: Methicillin (oxacillin) susceptible coagulase negative staphylococcus. Possible blood culture contaminant (unless isolated from more than one blood culture draw or clinical case suggests pathogenicity). No antibiotic treatment is indicated for blood  culture contaminants. CRITICAL RESULT CALLED TO, READ BACK BY AND VERIFIED WITH: R. FANNING PHARMD, AT 1334 06/12/18 BY D. VANHOOK    Staphylococcus aureus (BCID) NOT DETECTED NOT DETECTED Final   Methicillin resistance NOT DETECTED NOT DETECTED Final   Streptococcus species NOT DETECTED NOT DETECTED Final   Streptococcus agalactiae NOT DETECTED NOT DETECTED Final   Streptococcus pneumoniae NOT DETECTED NOT DETECTED Final   Streptococcus pyogenes NOT DETECTED NOT DETECTED Final   Acinetobacter baumannii NOT DETECTED NOT DETECTED Final   Enterobacteriaceae species  NOT DETECTED NOT DETECTED Final   Enterobacter cloacae complex NOT DETECTED NOT DETECTED Final   Escherichia coli NOT DETECTED NOT DETECTED Final  Klebsiella oxytoca NOT DETECTED NOT DETECTED Final   Klebsiella pneumoniae NOT DETECTED NOT DETECTED Final   Proteus species NOT DETECTED NOT DETECTED Final   Serratia marcescens NOT DETECTED NOT DETECTED Final   Haemophilus influenzae NOT DETECTED NOT DETECTED Final   Neisseria meningitidis NOT DETECTED NOT DETECTED Final   Pseudomonas aeruginosa NOT DETECTED NOT DETECTED Final   Candida albicans NOT DETECTED NOT DETECTED Final   Candida glabrata NOT DETECTED NOT DETECTED Final   Candida krusei NOT DETECTED NOT DETECTED Final   Candida parapsilosis NOT DETECTED NOT DETECTED Final   Candida tropicalis NOT DETECTED NOT DETECTED Final    Comment: Performed at [facility], [address] [place], [place] [address]  MRSA PCR Screening     Status: Abnormal   Collection Time: 06/11/18 11:55 PM  Result Value Ref Range Status   MRSA by PCR POSITIVE (A) NEGATIVE Final    Comment:        The GeneXpert MRSA Assay (FDA approved for NASAL specimens only), is one component of a comprehensive MRSA colonization surveillance program. It is not intended to diagnose MRSA infection nor to guide or monitor treatment for MRSA infections. RESULT CALLED TO, READ BACK BY AND VERIFIED WITH: L. Sheliah Plane 1914 06/12/2018 Girtha Hake Performed at [facility], [address] [place], [place] [address]   Culture, blood (routine x 2)     Status: None (Preliminary result)   Collection Time: 06/13/18  2:56 PM  Result Value Ref Range Status   Specimen Description BLOOD RIGHT HAND  Final   Special Requests   Final    BOTTLES DRAWN AEROBIC AND ANAEROBIC Blood Culture adequate volume   Culture   Final    NO GROWTH 3 DAYS Performed at [facility], [address], [place], [place] [address]    Report Status PENDING  Incomplete  Culture, blood  (routine x 2)     Status: None (Preliminary result)   Collection Time: 06/13/18  3:06 PM  Result Value Ref Range Status   Specimen Description BLOOD LEFT HAND  Final   Special Requests   Final    BOTTLES DRAWN AEROBIC AND ANAEROBIC Blood Culture adequate volume   Culture   Final    NO GROWTH 3 DAYS Performed at [facility], [address] [place], [place] [address]    Report Status PENDING  Incomplete  Surgical pcr screen     Status: Abnormal   Collection Time: 06/14/18  1:25 AM  Result Value Ref Range Status   MRSA, PCR POSITIVE (A) NEGATIVE Final    Comment: CRITICAL RESULT CALLED TO, READ BACK BY AND VERIFIED WITH: RN Lucrezia Europe [id number]  THANEY     Staphylococcus aureus POSITIVE (A) NEGATIVE Final    Comment: CRITICAL RESULT CALLED TO, READ BACK BY AND VERIFIED WITH: RN Lucrezia Europe [id number]  THANEY Performed at [facility], [address], [place], [place] [address]          Radiology Studies: [place] Lt Lower Extrem Ltd Soft Tissue Non Vascular  Result Date: 06/14/2018 CLINICAL DATA:  Superficial medial left thigh abscess by CT EXAM: ULTRASOUND LEFT LOWER EXTREMITY LIMITED TECHNIQUE: Ultrasound examination of the lower extremity soft tissues was performed in the area of clinical concern. COMPARISON:  06/11/2018 CT FINDINGS: Medial left thigh area of concern demonstrates localized skin thickening. Deep to the skin, there is a superficial subcutaneous complex fluid collection measuring 1.6 x 2.0 x 3.4 cm. Internal  contents are heterogeneous and hypoechoic. This correlates with the CT finding. IMPRESSION: 3.4 cm left medial thigh subcutaneous complex fluid collection compatible with abscess. Electronically Signed   By: Judie Petit.  Shick M.D.   On: 06/14/2018 14:31        Scheduled Meds: . atorvastatin  40 mg Oral q1800  . calcium carbonate  1 tablet Oral TID AC  . chlorhexidine  15 mL Mouth/Throat BID  . Chlorhexidine Gluconate Cloth  6 each Topical Q0600  .  enoxaparin (LOVENOX) injection  85 mg Subcutaneous BID  . mupirocin ointment  1 application Nasal BID  . pantoprazole  40 mg Oral Q0600  . pregabalin  25 mg Oral Q8H  . sodium chloride flush  3 mL Intravenous Q12H  . sodium chloride flush  3 mL Intravenous Q12H   Continuous Infusions: . sodium chloride 250 mL (06/14/18 0402)  .  ceFAZolin (ANCEF) IV 1 g (06/16/18 1338)  . dextrose 50 mL/hr at 06/16/18 1245  . DOPamine 5 mcg/kg/min (06/15/18 1600)  . vancomycin 1,500 mg (06/15/18 2210)     LOS: 4 days    Time spent: 35 minutes    Alba Cory, MD [facility] Pager [phone]  If 7PM-7AM, please contact night-coverage [email or web address] Password [email or web address] 06/16/2018, 1:58 PM

## 2018-06-16 NOTE — Progress Notes (Signed)
IR consulted for thigh abscess aspiration and drainage.  Plan was made for possible aspiration in [place] under sedation, however this was delayed by patient's bradycardia.  In the interim, thigh has developed spontaneous drainage.  Discussed with surgery who recommends obtaining culture from wound drainage at bedside.  No need for IR at this time.  Please re-consult if needed.   Loyce Dys, MS RD PA-C 10:46 AM

## 2018-06-17 LAB — BASIC METABOLIC PANEL
Anion gap: 8 (ref 5–15)
BUN: 8 mg/dL (ref 8–23)
CO2: 23 mmol/L (ref 22–32)
Calcium: 8.5 mg/dL — ABNORMAL LOW (ref 8.9–10.3)
Chloride: 110 mmol/L (ref 98–111)
Creatinine, Ser: 0.73 mg/dL (ref 0.44–1.00)
GFR calc Af Amer: >60 mL/min (ref >60)
GFR calc non Af Amer: >60 mL/min (ref >60)
Glucose, Bld: 126 mg/dL — ABNORMAL HIGH (ref 70–99)
Potassium: 4 mmol/L (ref 3.5–5.1)
Sodium: 141 mmol/L (ref 135–145)

## 2018-06-17 LAB — CBC
HCT: 38.4 % (ref 36.0–46.0)
Hemoglobin: 12.4 g/dL (ref 12.0–15.0)
MCH: 26.3 pg (ref 26.0–34.0)
MCHC: 32.3 g/dL (ref 30.0–36.0)
MCV: 81.4 fL (ref 80.0–100.0)
Platelets: 134 10*3/uL — ABNORMAL LOW (ref 150–400)
RBC: 4.72 MIL/uL (ref 3.87–5.11)
RDW: 17.2 % — ABNORMAL HIGH (ref 11.5–15.5)
WBC: 8.6 10*3/uL (ref 4.0–10.5)
nRBC: 0 % (ref 0.0–0.2)

## 2018-06-17 MED ORDER — ENSURE ENLIVE PO LIQD
237.0000 mL | Freq: Three times a day (TID) | ORAL | Status: DC
  Administered 2018-06-17 – 2018-06-21 (×6): 237 mL via ORAL

## 2018-06-17 MED ORDER — VANCOMYCIN HCL IN DEXTROSE 750-5 MG/150ML-% IV SOLN
750.0000 mg | INTRAVENOUS | Status: DC
  Administered 2018-06-17 – 2018-06-18 (×2): 750 mg via INTRAVENOUS
  Filled 2018-06-17 (×2): qty 150

## 2018-06-17 MED ORDER — ADULT MULTIVITAMIN W/MINERALS CH
1.0000 | ORAL_TABLET | Freq: Every day | ORAL | Status: DC
  Administered 2018-06-17 – 2018-06-21 (×5): 1 via ORAL
  Filled 2018-06-17 (×4): qty 1

## 2018-06-17 MED ORDER — KETOROLAC TROMETHAMINE 15 MG/ML IJ SOLN
15.0000 mg | Freq: Once | INTRAMUSCULAR | Status: AC
  Administered 2018-06-17: 15 mg via INTRAVENOUS
  Filled 2018-06-17: qty 1

## 2018-06-17 MED ORDER — DICLOFENAC SODIUM 1 % TD GEL
2.0000 g | Freq: Four times a day (QID) | TRANSDERMAL | Status: DC
  Administered 2018-06-17 – 2018-06-21 (×14): 2 g via TOPICAL
  Filled 2018-06-17: qty 100

## 2018-06-17 NOTE — Progress Notes (Signed)
Nutrition Follow-up  DOCUMENTATION CODES:   Obesity unspecified  INTERVENTION:   -Continue Ensure Enlive po TID, each supplement provides 350 kcal and 20 grams of protein -Continue MVI with minerals daily  NUTRITION DIAGNOSIS:   Increased nutrient needs related to wound healing, post-op healing as evidenced by estimated needs.  Ongoing  GOAL:   Patient will meet greater than or equal to 90% of their needs  Progressing  MONITOR:   PO intake, Supplement acceptance, Labs, Weight trends, Skin, I & O's  REASON FOR ASSESSMENT:   Low Braden    ASSESSMENT:   HPI: Margaret Collier is a 85 y.o. female with medical history significant of HTN, HLD, chronic A. 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  History is limited from the patient due to history of dementia and reports of increased confusion.  Patient reports that she is unaware why she is here.  However, notes that she was on antibiotics at [facility] prior to coming into the hospital for an infection in her leg.  Complains of pain in her back and increased sensitive on the left sided that is not new.  Discussed case with the patient's niece over the phone who notes that they had started her on antibiotics for an infection in her legs and reports of a cut to her right leg 2 days ago.  At baseline she is reportedly bedbound and the facility uses a Hoyer lift to get her up to chair daily.  Patient denies any other complaints at this time.  5/9- I&D of lt thigh abscess cancelled due to bradycardia 5/10- US guided aspiration of lt thigh abscess cancelled secondary to bradycardia 5/11- US guided aspiration of lt thigh abscess cancelled due to bradycardia   Reviewed I/O's: +160 ml x 24 hours and +1 L since admission  UOP: 2.8 L since admission  Pt intake remains variable; noted meal completion  25-100%, however, ranging between 0-25%. Per RN notes, pt has been refusing meals, but accepting of Ensure supplements.   Palliative care team following for goals of care discussions.   Labs reviewed: CBGS: 136.  Diet Order:   Diet Order            Diet Heart Room service appropriate? Yes; Fluid consistency: Thin  Diet effective now              EDUCATION NEEDS:   No education needs have been identified at this time  Skin:  Skin Assessment: Skin Integrity Issues: Skin Integrity Issues:: Other (Comment) Other: lt thigh abscess with cellulits  Last BM:  06/16/18  Height:   Ht Readings from Last 1 Encounters:  06/11/18 5' (1.524 m)    Weight:   Wt Readings from Last 1 Encounters:  06/17/18 87 kg    Ideal Body Weight:  45.5 kg  BMI:  Body mass index is 37.46 kg/m.  Estimated Nutritional Needs:   Kcal:  1650-1850  Protein:  85-100 grams  Fluid:  > 1.6 L    [name] A. Mayford Knife, RD, LDN, CDCES Registered Dietitian II Certified Diabetes Care and Education Specialist Pager: [phone] After hours Pager: [phone]

## 2018-06-17 NOTE — Progress Notes (Signed)
Telemetry reviewed, AFib 35-40bpm, off dopamine currently She is awake, alert, talking with palliative care RN, eating lunch with some assist by NT. She appears in NAD, tells me her name and knows she is at [facility], with re-orientation recalls she lives at [place] [facility], not here.  Stay off dopamine for now.  If she has symptomatic bradycardia, will plan to resume.  Francis Dowse, PA-C

## 2018-06-17 NOTE — Progress Notes (Addendum)
Progress Note  Patient Name: Margaret Collier Date of Encounter: 06/17/2018  Primary Cardiologist: Merdis DelayALLYSON ELIZABETH BLAZEJEWSKI, PA-C   Subjective   Denies CP or SOB, more interactive and alert today  Inpatient Medications    Scheduled Meds:  atorvastatin  40 mg Oral q1800   calcium carbonate  1 tablet Oral TID AC   chlorhexidine  15 mL Mouth/Throat BID   Chlorhexidine Gluconate Cloth  6 each Topical Q0600   enoxaparin (LOVENOX) injection  85 mg Subcutaneous BID   mupirocin ointment  1 application Nasal BID   pantoprazole  40 mg Oral Q0600   pregabalin  25 mg Oral Q8H   sodium chloride flush  3 mL Intravenous Q12H   sodium chloride flush  3 mL Intravenous Q12H   Continuous Infusions:  sodium chloride 250 mL (06/14/18 0402)    ceFAZolin (ANCEF) IV Stopped (06/17/18 0508)   dextrose 50 mL/hr at 06/17/18 0530   DOPamine 5 mcg/kg/min (06/17/18 0530)   vancomycin 1,500 mg (06/15/18 2210)   PRN Meds: sodium chloride, acetaminophen **OR** acetaminophen, albuterol, atropine, ondansetron **OR** ondansetron (ZOFRAN) IV, oxyCODONE, phenol   Vital Signs    Vitals:   06/16/18 2005 06/17/18 0115 06/17/18 0227 06/17/18 0442  BP: (!) 149/43 128/64  (!) 155/73  Pulse: (!) 47   (!) 45  Resp: 20   16  Temp: 98.2 F (36.8 C)   97.6 F (36.4 C)  TempSrc: Oral   Oral  SpO2: 94%   96%  Weight:   87 kg   Height:        Intake/Output Summary (Last 24 hours) at 06/17/2018 0930 Last data filed at 06/17/2018 0817 Gross per 24 hour  Intake 2479.75 ml  Output 3150 ml  Net -670.25 ml   Last 3 Weights 06/17/2018 06/16/2018 06/15/2018  Weight (lbs) 191 lb 12.8 oz 187 lb 9.6 oz 185 lb 3 oz  Weight (kg) 87 kg 85.095 kg 84 kg      Telemetry    AFib, generally 40's, dips to high 30's, 2 pauses of 4 seconds noted late yesterday afternoon - Personally Reviewed  ECG    No new EKGs - Personally Reviewed  Physical Exam   examined by Dr. Elberta Fortisamnitz GEN: No acute distress,  more alert and conversational this morning, more appropriate as well Neck: No JVD Cardiac: Irreg-irreg, bradycardic, no murmurs, rubs, or gallops.  Respiratory: normal WOB. GI: not examined  MS: No edema; No deformity. Neuro:  AAO to self, and some situation.  She recalls discussion about possible need forpacer Psych: Normal affect   Labs    Chemistry Recent Labs  Lab 06/11/18 [id number]  06/14/18 0217 06/15/18 0443 06/16/18 0716  NA 141   < > 146* 140 144  K 4.2   < > 3.6 3.8 3.4*  CL 101   < > 110 106 110  CO2 28   < > 24 22 23   GLUCOSE 77   < > 76 121* 124*  BUN 46*   < > 19 17 9   CREATININE 1.15*   < > 0.76 0.70 0.60  CALCIUM 9.7   < > 8.8* 8.6* 8.6*  PROT 7.1  --   --   --   --   ALBUMIN 3.3*  --   --   --   --   AST 31  --   --   --   --   ALT 22  --   --   --   --  ALKPHOS 128*  --   --   --   --   BILITOT 0.9  --   --   --   --   GFRNONAA 44*   < > >60 >60 >60  GFRAA 51*   < > >60 >60 >60  ANIONGAP 12   < > < > = values in this interval not displayed.     Hematology Recent Labs  Lab 06/14/18 0217 06/15/18 0444 06/16/18 0716  WBC 6.4 8.8 9.4  RBC 4.84 4.71 6.06*  HGB 12.5 12.0 16.0*  HCT 39.5 38.5 48.9*  MCV 81.6 81.7 80.7  MCH 25.8* 25.5* 26.4  MCHC 31.6 31.2 32.7  RDW 17.3* 17.3* 18.3*  PLT 149* 146* PLATELET CLUMPS NOTED ON SMEAR, UNABLE TO ESTIMATE    Cardiac EnzymesNo results for input(s): TROPONINI in the last 168 hours. No results for input(s): TROPIPOC in the last 168 hours.   BNPNo results for input(s): BNP, PROBNP in the last 168 hours.   DDimer No results for input(s): DDIMER in the last 168 hours.   Radiology    No results found.  Cardiac Studies   Echo report October 07, 2017 [place] Normal left ventricular size and systolic function with no appreciable segmental abnormality. Ejection fraction is estimated at 65-70% Diastolic function appears indeterminate Normal left ventricular wall thickness Mild mitral annular  calcification. Mild mitral regurgitation. Mild aortic regurgitation. Estimated RVSP 55 mm Hg.  Patient Profile     85 y.o. female with a hx of permanent AFib, HTN, HLD, prior stroke, OSA on O2 at night , lymphedema, ?dementia, lives at [facility] facility, and reportedly bed-bound, admitted to [facility] 06/11/18 with confusion, altered speech, CT brain noted no acute change, she was afebrile, bradycardic, X-rays of left leg were normal, but CT scan revealed 3.5 cm density concerning for abscess  Cardiology >> EP consulted for bradycardia  Assessment & Plan    1. Marked bradycardia  Started on dopamine yesterday with further slowing with rates dipping to 20's  HR generally high 40's since  BP remains stable  Mild TSH elevation, 6.6  No nodal blocking agents here or at home   She has h/o bradycardia, asymptomatic described as nocturnal in prior hospital stays  In review of record, she has h/o stroke, sounds essentially bed-bound   High risk of infection complications with PPM given active skin infection currently, not ideal at all.  Initial set of blood cultures 1 of 2 positive, the second of the 2 remained neg for 5 days  Send set of [facility] are both neg, so far 4 days (today report is pending)  Dr. Elberta Fortis in d/w Niece who is POA earlier in her hospitalization, unclear how symptomatic she is, she is bed-bound post prior stroke BP is good, pt is alert, conversations (though with some confusion), strong preference to avoid PPM by family (and patient, though noted found not to have capacity to make decisions)  She has been off dopamine since yesterday AM, her HR generally maintaining 40's, 2 breif pauses yesterday without reports of any symptoms  Vida Nicol hold her NPO this AM, though hopeful we can avoid pacer.  Once final decision is made, Michaeljohn Biss update niece.   2. Thigh abscess, cellulitis  Initially planned for I/D, though cancelled 2/2 bradycardia  Last surgery note, reports spontaneous drainage  with some improving in appearance, to continue antibiotics.  Initial blood cultures 1 of 2 positive for coag neg staph (the second remains negative  to date -5 days)  Medicine notes discussion with ID, probable contaminate, to repeat, so far repeat BC neg (x2) for 4 days (today pending) C/w surgery, medicine teams    3. Permanent AFib  CHA2DS2Vasc is 6, on Eliquis, appropriately dosed for weight/Creat  Transitioned to heparin gtt in anticipation of thigh surgery (subsequently canceled) >> lovenox  Pending final decision on pacer or not prior to resumption of OAC   4. Encephalopathy      ? some baseline dementia as well      Suspect worse with infection and is improving        For questions or updates, please contact [facility] Please consult [email or web address] for contact info under        Signed, Sheilah Pigeon, PA-C  06/17/2018, 9:30 AM    I have seen and examined this patient with Francis Dowse.  Agree with above, note added to reflect my findings.  On exam, bradycardic, irregular, no murmurs, lungs clear.  Dopamine was held yesterday.  Patient has since had heart rates that have been in the mainly 40s to 50s.  She is not symptomatic with no weakness, fatigue, shortness of breath, dizziness.  She is bedbound.  At this point, I feel that it is best to avoid pacemaker implantation.  She does still have infection in her leg for which she is getting antibiotics.  Should she need a pacemaker, would prefer to hold off until after infectious issues have resolved.  Would be okay to restart her anticoagulation.  If she does have further episodes of significant bradycardia with symptoms, would be happy to reassess.  [facility] Jaida Basurto sign off.   Medication Recommendations:  Hold AV nodal blockers Other recommendations (labs, testing, etc):  none Follow up as an outpatient:  Per primary cardiology at [facility]. Doris Gruhn MD 06/18/2018 12:53 PM

## 2018-06-17 NOTE — Progress Notes (Addendum)
Palliative Medicine RN Note: Rec'd a call from pt's niece [name]. Patient is requesting chaplain visit; note that it was ordered yesterday by PMT NP. I spoke with chaplain on call; he will ensure someone goes this am.  Margret Chance. Ladonte Verstraete, RN, BSN, [facility] Palliative Medicine Team 06/17/2018 9:46 AM Office [phone]

## 2018-06-17 NOTE — Progress Notes (Signed)
I responded to a [place] to provide spiritual support for the patient. I visited the patient's room, listened with compassion, provided words of comfort and encouragement, and led in prayer. I shared that the Chaplain is available for additional support as needed or requested.    06/17/18 1100  Clinical Encounter Type  Visited With Patient  Visit Type Spiritual support  Referral From Nurse  Consult/Referral To Chaplain  Spiritual Encounters  Spiritual Needs Prayer  Stress Factors  Patient Stress Factors Exhausted    Chaplain Dr Melvyn Novas

## 2018-06-17 NOTE — Progress Notes (Signed)
Margaret Collier (Power of attorney), spoke to patient tonight and stated, "I think I am going to let them put the pacemaker since that is all they can do for me now." Gave update to [place] regarding Ms. Wichert. I will keep monitoring patient.

## 2018-06-17 NOTE — Progress Notes (Signed)
Pharmacy Antibiotic Note  Margaret RobesDorothy Collier is a 85 y.o. female admitted on 06/11/2018 with L thigh cellulits.  Pharmacy has been consulted for Vancomycin dosing.  ID: abx for cellulitis L thigh (failed oral abx); LA 0.9, WBC 8.6 stable. Abscess draining.  Cefazolin 5/6> Vancomycin 5/6 >>  5/6 BCx: 1/2 CNS 5/6 COVID: negative  5/6 MRSA PCR positive 5/8 Bcx: NGTD  Vancomycin 750 mg IV Q 24 hrs. Goal AUC 400-550.  Expected AUC: 493 SCr used: 0.8  Plan: Ancef 1g IV q 8 hrs. Vancomycin change to 750mg  IV q 24h   Height: 5' (152.4 cm) Weight: 191 lb 12.8 oz (87 kg) IBW/kg (Calculated) : 45.5  Temp (24hrs), Avg:97.8 F (36.6 C), Min:97.4 F (36.3 C), Max:98.2 F (36.8 C)  Recent Labs  Lab 06/11/18 0918  06/11/18 1113  06/13/18 0717 06/14/18 0217 06/15/18 0443 06/15/18 0444 06/16/18 0716 06/17/18 0843  WBC 11.0*  --   --    < > 8.4 6.4  --  8.8 9.4 8.6  CREATININE 1.15*   < >  --    < > 0.93 0.76 0.70  --  0.60 0.73  LATICACIDVEN 0.9  --  1.0  --   --   --   --   --   --   --    < > = values in this interval not displayed.    Estimated Creatinine Clearance: 51.3 mL/min (by C-G formula based on SCr of 0.73 mg/dL).    Allergies  Allergen Reactions  . Zanaflex [Tizanidine Hcl] Shortness Of Breath, Other (See Comments) and Cough    Dehydration - reported by [facility] 10/07/13  . Chlorthalidone Other (See Comments)    syncope  . Dilaudid [Hydromorphone Hcl] Hives and Swelling  . Fosamax [Alendronate Sodium] Nausea And Vomiting and Other (See Comments)    Back and hip pain  . Lovastatin Other (See Comments)    Weakness - reported [facility] 05/13/14   . Statins Other (See Comments)    Myalgia, weakness - reported by [facility] 05/13/14  . Alprazolam Other (See Comments)    Dry mouth - reported by [facility] 05/13/14   . Carisoprodol Other (See Comments)    Dizziness - reported by [facility] 05/13/14   . Colesevelam Nausea And Vomiting and Other  (See Comments)    GI upset and stool change - reported by [facility] 05/13/14   . Duloxetine Other (See Comments)    Dizziness - reported by [facility] 05/13/14   . Felodipine Other (See Comments)    Dizziness, groin pain - reported by [facility] 05/13/14   . Furosemide Other (See Comments)    Dry mouth/dizziness - reported by [facility] 05/13/14   . Gabapentin Other (See Comments)    Insomnia - [facility] 05/13/14   . Hydrocodone-Acetaminophen Nausea And Vomiting    reported by [facility] 05/13/14   . Ibuprofen Other (See Comments)    GI pain - reported by [facility] 05/13/14   . Meclizine Other (See Comments)    Sleeplessness - reported by [facility] 05/13/14   . Meperidine Other (See Comments)    Jitteriness - reported by [facility] 05/13/14   . Methadone Other (See Comments)    Stool impaction - reported by [facility] 05/13/14   . Morphine Other (See Comments)    Dizziness - reported by [facility] 05/13/14   . Omeprazole Other (See Comments)    Sleeplessness - reported by [facility] 05/13/14   . Oxycodone Other (See Comments)    Groin pain - reported by [facility] 05/13/14   . Pravastatin Other (See Comments)    Weakness - reported by [facility] 05/13/14   . Simvastatin Other (See Comments)    Weakness - reported by [facility] 05/13/14   . Tramadol Anxiety and Other (See Comments)    Nervous - reported by [facility] 05/13/14   . Valsartan Other (See Comments)    Dizziness - reported by [facility] 05/13/14     Kunal Levario S. Merilynn Finland, PharmD, BCPS Clinical Staff Pharmacist Misty Stanley Stillinger 06/17/2018 1:09 PM

## 2018-06-17 NOTE — Progress Notes (Signed)
Progress Note  Patient Name: Margaret Collier Date of Encounter: 06/17/2018  Primary Cardiologist: Merdis DelayALLYSON ELIZABETH BLAZEJEWSKI, PA-C   Subjective   Currently feeling well.  Her only complaint is of back pain.  Inpatient Medications    Scheduled Meds: . atorvastatin  40 mg Oral q1800  . calcium carbonate  1 tablet Oral TID AC  . chlorhexidine  15 mL Mouth/Throat BID  . Chlorhexidine Gluconate Cloth  6 each Topical Q0600  . diclofenac sodium  2 g Topical QID  . enoxaparin (LOVENOX) injection  85 mg Subcutaneous BID  . feeding supplement (ENSURE ENLIVE)  237 mL Oral TID BM  . ketorolac  15 mg Intravenous Once  . multivitamin with minerals  1 tablet Oral Daily  . mupirocin ointment  1 application Nasal BID  . pantoprazole  40 mg Oral Q0600  . pregabalin  25 mg Oral Q8H  . sodium chloride flush  3 mL Intravenous Q12H  . sodium chloride flush  3 mL Intravenous Q12H   Continuous Infusions: . sodium chloride 250 mL (06/14/18 0402)  .  ceFAZolin (ANCEF) IV 1 g (06/17/18 1334)  . dextrose 50 mL/hr at 06/17/18 0943  . vancomycin 750 mg (06/17/18 1527)   PRN Meds: sodium chloride, acetaminophen **OR** acetaminophen, albuterol, atropine, ondansetron **OR** ondansetron (ZOFRAN) IV, oxyCODONE, phenol   Vital Signs    Vitals:   06/17/18 0115 06/17/18 0227 06/17/18 0442 06/17/18 1149  BP: 128/64  (!) 155/73 106/86  Pulse:   (!) 45 (!) 46  Resp:   16 17  Temp:   97.6 F (36.4 C) 98 F (36.7 C)  TempSrc:   Oral Oral  SpO2:   96% 100%  Weight:  87 kg    Height:        Intake/Output Summary (Last 24 hours) at 06/17/2018 1602 Last data filed at 06/17/2018 1522 Gross per 24 hour  Intake 3019.75 ml  Output 2150 ml  Net 869.75 ml   Last 3 Weights 06/17/2018 06/16/2018 06/15/2018  Weight (lbs) 191 lb 12.8 oz 187 lb 9.6 oz 185 lb 3 oz  Weight (kg) 87 kg 85.095 kg 84 kg      Telemetry    Atrial fibrillation with slow response- Personally Reviewed  ECG    None new-  Personally Reviewed  Physical Exam   GEN: No acute distress.   Neck: No JVD Cardiac:  Bradycardic, irregular, no murmurs, rubs, or gallops.  Respiratory: Clear to auscultation bilaterally. GI: Soft, nontender, non-distended  MS: No edema; No deformity. Neuro:  Nonfocal  Psych: Normal affect   Labs    Chemistry Recent Labs  Lab 06/11/18 0918  06/15/18 0443 06/16/18 0716 06/17/18 0843  NA 141   < > 140 144 141  K 4.2   < > 3.8 3.4* 4.0  CL 101   < > 106 110 110  CO2 28   < > 22 23 23   GLUCOSE 77   < > 121* 124* 126*  BUN 46*   < > 17 9 8   CREATININE 1.15*   < > 0.70 0.60 0.73  CALCIUM 9.7   < > 8.6* 8.6* 8.5*  PROT 7.1  --   --   --   --   ALBUMIN 3.3*  --   --   --   --   AST 31  --   --   --   --   ALT 22  --   --   --   --  ALKPHOS 128*  --   --   --   --   BILITOT 0.9  --   --   --   --   GFRNONAA 44*   < > >60 >60 >60  GFRAA 51*   < > >60 >60 >60  ANIONGAP 12   < > 12 11 8    < > = values in this interval not displayed.     Hematology Recent Labs  Lab 06/15/18 0444 06/16/18 0716 06/17/18 0843  WBC 8.8 9.4 8.6  RBC 4.71 6.06* 4.72  HGB 12.0 16.0* 12.4  HCT 38.5 48.9* 38.4  MCV 81.7 80.7 81.4  MCH 25.5* 26.4 26.3  MCHC 31.2 32.7 32.3  RDW 17.3* 18.3* 17.2*  PLT 146* PLATELET CLUMPS NOTED ON SMEAR, UNABLE TO ESTIMATE 134*    Cardiac EnzymesNo results for input(s): TROPONINI in the last 168 hours. No results for input(s): TROPIPOC in the last 168 hours.   BNPNo results for input(s): BNP, PROBNP in the last 168 hours.   DDimer No results for input(s): DDIMER in the last 168 hours.   Radiology    No results found.  Cardiac Studies     Patient Profile     85 y.o. female who presented to the hospital with a leg abscess which has since been draining found to be in atrial fibrillation with slow ventricular response.  Assessment & Plan    Atrial fibrillation with slow ventricular response: Patient is bedbound currently.  She is asymptomatic from  her slow ventricular response.  It is turned off dopamine and has had a heart rates in the 30s to 40s.  We will continue to monitor overnight.  If she does not dip down much lower, will try to avoid pacemaker implantation.   For questions or updates, please contact [facility] Please consult [email or web address] for contact info under        Signed, Will Jorja Loa, MD  06/17/2018, 4:02 PM

## 2018-06-17 NOTE — Progress Notes (Signed)
PROGRESS NOTE    Margaret Collier  YNW:[id number] DOB: 04-Jan-1932 DOA: 06/11/2018 PCP: System, Pcp Not In    Brief Narrative 85 year old with past medical history significant for hypertension, hyperlipidemia, chronic A. fib on Eliquis oxygen dependent at night 2 L, CVA with residual left-sided weakness, OSA who presents complaining with worsening confusion and a speech problem.  History has been limited to get from the patient due to history of dementia and report of increased; confusion. Patient is bedbound at baseline, at facility they use a Hoyer lift to get her up in the chair. Evaluation and admission: A CT head showed areas of previous stroke.  CT scan left femur showed 3.5 cm abscess left mid thigh. Patient also was noted to be bradycardic with heart rate in the 40s.  Patient was refusing care, and surgery. She has been confused.  She was evaluated by Psychiatrist, who recommended that patient lack capacity to consent for surgery or refuse medical care. Surgery was consulted, patient will have I and D done tomorrow.   Blood culture 1 out 4 bottle grew Staph coagulase negative. Probably a contaminant. Repeated Blood culture 5-08 no growth to date.   EP consulted for evaluation of pacemaker for bradycardia. Plan is to monitor off dopamine today. No need for pacemaker if asymptomatic Bradycardia.   Assessment & Plan:   Principal Problem:   Evaluation by psychiatric service required Active Problems:   Atrial fibrillation (HCC)   OSA (obstructive sleep apnea)   History of CVA with residual deficit   Bradycardia   Cellulitis and abscess of left leg   AKI (acute kidney injury) (HCC)   Thrombocytopenia (HCC)   Adult failure to thrive   Abscess of left thigh   DNR (do not resuscitate) discussion   Palliative care by specialist   Weakness generalized  1-Left thigh abscess; cellulitis Patient present with confusion, leukocytosis, worsening redness of left lower extremity.  She failed  oral antibiotic. -CT showed focal area of abscess. -Per Psych; Patient lack capacity to consent for surgery or refusing medical care.  -surgery  Cancel today due to decreased hr overnight. Patient hr in the 30 on 5/09. -IR consulted after I discussed case with Dr Luisa Hart on 5-09.  For evaluation of US guide aspiration.  -Continue with IV antibiotics.  -Will consult palliative care for goals of care.  -As of today 5-10 per surgery note abscess draining on it own, continue with antibiotics. Await for HR stabilization. -5-11 per surgery per surgery abscess is spontaneously draining , no need for debridement or IR aspiration. Antibiotics coverage for MRSA.  -on ancef and vancomycin.  Abscess continue to drain, monitor.   Blood culture positive 1--4 bottle. Discussed with ID, Dr Luciana Axe probably a contaminant. No need to get 2 ECHO. He agree with repeating blood cultures.  Staph, coagulase negative.  On IV vancomycin. Repeated blood cultures 5-08; No growth to date.   2-Acute metabolic encephalopathy; She  was transferred from her facility with increased confusion and questionable worsening left side weakness. CT head was negative. Psychiatric consulted for evaluation for capacity, patient lacks capacity to consent for surgery or refusing medical care.   3-Bradycardia, asymptomatic -5-09; patient with episodes of bradycardia overnight 5-09, and pauses. Cardiology consulted again. See Dr Royann Shivers recommendations. No need for pacemaker at this time and implementing pacemaker with active infection is contraindicated.  -5-10; HR lower this am in the 20. Dr Edman Circle started Dopamine. -He also consulted EP for evaluation, but likely not good idea to place  pacemaker with active infection.  -Elevated TSH, but Free T 4 normal, and  free T 3 normal.  -Per EP; plan is  to hold Dopamine to reassess HR>  -Monitor today off dopamine. EP following.   4-Permanent A. fib on Eliquis: Eliquis on hold due to  possible surgical procedure. Cardiology  recommending heparin.  refuse labs, change heparin to lovenox.   5-History of dementia 6-mild chronic thrombocytopenia Stable.   7-AKI: Prior creatinine around 0.8.  Suspect prerenal.  Continue with IV fluids.  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  9-Constipation; having multiples BM, stop laxatives.   10-Chronic diastolic heart failure: Holding Lasix due to AKI and infection.  11-history of CVA with residual left-sided weakness, neuropathy: Repeated CT head negative.  12-OSA: Continue with 2 L of oxygen at night.  Hypokalemia; replete.   Estimated body mass index is 37.46 kg/m as calculated from the following:   Height as of this encounter: 5' (1.524 m).   Weight as of this encounter: 87 kg.   DVT prophylaxis: Was on Eliquis prior to admission, currently on hold due to possible surgical procedure Code Status: Full code Family Communication;  Niece updated 5-10. She was updated by EP and palliative 5-11 Disposition Plan: Patient needs to remain in the hospital for IV antibiotics, and further evaluation of bradycardia.   Consultants:   Cardiology  Surgery    Procedures:   none   Antimicrobials:   Ancef 5-06  Vancomycin 5-06   Subjective: Alert, complaining of back pain. I will order Voltaren for back pain. She is still complaining of left thigh abscess  Objective: Vitals:   06/16/18 2005 06/17/18 0115 06/17/18 0227 06/17/18 0442  BP: (!) 149/43 128/64  (!) 155/73  Pulse: (!) 47   (!) 45  Resp: 20   16  Temp: 98.2 F (36.8 C)   97.6 F (36.4 C)  TempSrc: Oral   Oral  SpO2: 94%   96%  Weight:   87 kg   Height:        Intake/Output Summary (Last 24 hours) at 06/17/2018 0728 Last data filed at 06/17/2018 0530 Gross per 24 hour  Intake 2959.75 ml  Output 2800 ml  Net 159.75 ml   Filed Weights   06/15/18 0524 06/16/18 0538 06/17/18 0227   Weight: 84 kg 85.1 kg 87 kg    Examination:  General exam: NAD Respiratory system: CTA Cardiovascular system: S 1, S 2 RRR Gastrointestinal system: BS present, soft. Mild tender Central nervous system: chronic left side weakness and contraction.  Extremities: right leg with skin tear cover.  Skin: Left thigh, less redness, draining small amount of purulent material.     Data Reviewed: I have personally reviewed following labs and imaging studies  CBC: Recent Labs  Lab 06/11/18 0918 06/12/18 0542 06/13/18 0717 06/14/18 0217 06/15/18 0444 06/16/18 0716  WBC 11.0* 13.6* 8.4 6.4 8.8 9.4  NEUTROABS 7.8*  --   --   --   --   --   HGB 13.7 12.6 13.1 12.5 12.0 16.0*  HCT 44.3 40.2 41.5 39.5 38.5 48.9*  MCV 83.4 82.2 81.9 81.6 81.7 80.7  PLT 149* 146* 149* 149* 146* PLATELET CLUMPS NOTED ON SMEAR, UNABLE TO ESTIMATE   Basic Metabolic Panel: Recent Labs  Lab 06/12/18 0542 06/13/18 0717 06/14/18 0217 06/14/18 0223 06/15/18 0443 06/16/18 0716  NA 141 145 146*  --  140 144  K 4.3 4.0 3.6  --  3.8 3.4*  CL 105 107 110  --  106 110  CO2 25 22 24   --  22 23  GLUCOSE 90 81 76  --  121* 124*  BUN 37* 27* 19  --  17 9  CREATININE 1.14* 0.93 0.76  --  0.70 0.60  CALCIUM 9.0 9.2 8.8*  --  8.6* 8.6*  MG 2.1  --   --  2.0  --   --    GFR: Estimated Creatinine Clearance: 51.3 mL/min (by C-G formula based on SCr of 0.6 mg/dL). Liver Function Tests: Recent Labs  Lab 06/11/18 0918  AST 31  ALT 22  ALKPHOS 128*  BILITOT 0.9  PROT 7.1  ALBUMIN 3.3*   No results for input(s): LIPASE, AMYLASE in the last 168 hours. No results for input(s): AMMONIA in the last 168 hours. Coagulation Profile: Recent Labs  Lab 06/11/18 0918 06/12/18 0542 06/15/18 0443 06/16/18 0716  INR 2.1* 2.2* 1.3* 1.3*   Cardiac Enzymes: No results for input(s): CKTOTAL, CKMB, CKMBINDEX, TROPONINI in the last 168 hours. BNP (last 3 results) No results for input(s): PROBNP in the last 8760 hours.  HbA1C: No results for input(s): HGBA1C in the last 72 hours. CBG: Recent Labs  Lab 06/14/18 0541 06/14/18 1014 06/15/18 0839  GLUCAP 88 89 136*   Lipid Profile: No results for input(s): CHOL, HDL, LDLCALC, TRIG, CHOLHDL, LDLDIRECT in the last 72 hours. Thyroid Function Tests: Recent Labs    06/15/18 0443 06/15/18 0444  TSH 6.660*  --   FREET4  --  0.95  T3FREE  --  2.1   Anemia Panel: No results for input(s): VITAMINB12, FOLATE, FERRITIN, TIBC, IRON, RETICCTPCT in the last 72 hours. Sepsis Labs: Recent Labs  Lab 06/11/18 [id number] 06/11/18 1113  LATICACIDVEN 0.9 1.0    Recent Results (from the past 240 hour(s))  Culture, blood (routine x 2)     Status: Abnormal   Collection Time: 06/11/18  9:18 AM  Result Value Ref Range Status   Specimen Description BLOOD RIGHT ANTECUBITAL  Final   Special Requests   Final    BOTTLES DRAWN AEROBIC AND ANAEROBIC Blood Culture adequate volume   Culture  Setup Time   Final    GRAM POSITIVE COCCI ANAEROBIC BOTTLE ONLY CRITICAL RESULT CALLED TO, READ BACK BY AND VERIFIED WITH: R. FANNING PHARMD, AT 1334 06/12/18 BY D. VANHOOK    Culture (A)  Final    STAPHYLOCOCCUS SPECIES (COAGULASE NEGATIVE) THE SIGNIFICANCE OF ISOLATING THIS ORGANISM FROM A SINGLE SET OF BLOOD CULTURES WHEN MULTIPLE SETS ARE DRAWN IS UNCERTAIN. PLEASE NOTIFY THE MICROBIOLOGY DEPARTMENT WITHIN ONE WEEK IF SPECIATION AND SENSITIVITIES ARE REQUIRED. Performed at [facility], [address] [place], [place] [address]    Report Status 06/14/2018 FINAL  Final  Culture, blood (routine x 2)     Status: None   Collection Time: 06/11/18  9:18 AM  Result Value Ref Range Status   Specimen Description BLOOD BLOOD RIGHT FOREARM  Final   Special Requests   Final    BOTTLES DRAWN AEROBIC AND ANAEROBIC Blood Culture adequate volume   Culture   Final    NO GROWTH 5 DAYS Performed at [facility], [address] [place], [place] [address]    Report Status 06/16/2018  FINAL  Final  SARS Coronavirus 2 (CEPHEID - Performed in [facility] hospital lab), Hosp Order     Status: None   Collection Time: 06/11/18  9:18 AM  Result Value Ref  Range Status   SARS Coronavirus 2 NEGATIVE NEGATIVE Final    Comment: (NOTE) If result is NEGATIVE SARS-CoV-2 target nucleic acids are NOT DETECTED. The SARS-CoV-2 RNA is generally detectable in upper and lower  respiratory specimens during the acute phase of infection. The lowest  concentration of SARS-CoV-2 viral copies this assay can detect is 250  copies / mL. A negative result does not preclude SARS-CoV-2 infection  and should not be used as the sole basis for treatment or other  patient management decisions.  A negative result may occur with  improper specimen collection / handling, submission of specimen other  than nasopharyngeal swab, presence of viral mutation(s) within the  areas targeted by this assay, and inadequate number of viral copies  (<250 copies / mL). A negative result must be combined with clinical  observations, patient history, and epidemiological information. If result is POSITIVE SARS-CoV-2 target nucleic acids are DETECTED. The SARS-CoV-2 RNA is generally detectable in upper and lower  respiratory specimens dur ing the acute phase of infection.  Positive  results are indicative of active infection with SARS-CoV-2.  Clinical  correlation with patient history and other diagnostic information is  necessary to determine patient infection status.  Positive results do  not rule out bacterial infection or co-infection with other viruses. If result is PRESUMPTIVE POSTIVE SARS-CoV-2 nucleic acids MAY BE PRESENT.   A presumptive positive result was obtained on the submitted specimen  and confirmed on repeat testing.  While 2019 novel coronavirus  (SARS-CoV-2) nucleic acids may be present in the submitted sample  additional confirmatory testing may be necessary for epidemiological  and / or clinical  management purposes  to differentiate between  SARS-CoV-2 and other Sarbecovirus currently known to infect humans.  If clinically indicated additional testing with an alternate test  methodology [phone]) is advised. The SARS-CoV-2 RNA is generally  detectable in upper and lower respiratory sp ecimens during the acute  phase of infection. The expected result is Negative. Fact Sheet for Patients:  BoilerBrush.com.cy Fact Sheet for Healthcare Providers: https://pope.com/ This test is not yet approved or cleared by the [place] FDA and has been authorized for detection and/or diagnosis of SARS-CoV-2 by FDA under an Emergency Use Authorization (EUA).  This EUA will remain in effect (meaning this test can be used) for the duration of the COVID-19 declaration under Section 564(b)(1) of the Act, 21 U.S.C. section 360bbb-3(b)(1), unless the authorization is terminated or revoked sooner. Performed at [facility], [address], [place], [place] [address]   Blood Culture ID Panel (Reflexed)     Status: Abnormal   Collection Time: 06/11/18  9:18 AM  Result Value Ref Range Status   Enterococcus species NOT DETECTED NOT DETECTED Final   Listeria monocytogenes NOT DETECTED NOT DETECTED Final   Staphylococcus species DETECTED (A) NOT DETECTED Final    Comment: Methicillin (oxacillin) susceptible coagulase negative staphylococcus. Possible blood culture contaminant (unless isolated from more than one blood culture draw or clinical case suggests pathogenicity). No antibiotic treatment is indicated for blood  culture contaminants. CRITICAL RESULT CALLED TO, READ BACK BY AND VERIFIED WITH: R. FANNING PHARMD, AT 1334 06/12/18 BY D. VANHOOK    Staphylococcus aureus (BCID) NOT DETECTED NOT DETECTED Final   Methicillin resistance NOT DETECTED NOT DETECTED Final   Streptococcus species NOT DETECTED NOT DETECTED Final   Streptococcus agalactiae NOT  DETECTED NOT DETECTED Final   Streptococcus pneumoniae NOT DETECTED NOT DETECTED Final   Streptococcus pyogenes NOT DETECTED NOT  DETECTED Final   Acinetobacter baumannii NOT DETECTED NOT DETECTED Final   Enterobacteriaceae species NOT DETECTED NOT DETECTED Final   Enterobacter cloacae complex NOT DETECTED NOT DETECTED Final   Escherichia coli NOT DETECTED NOT DETECTED Final   Klebsiella oxytoca NOT DETECTED NOT DETECTED Final   Klebsiella pneumoniae NOT DETECTED NOT DETECTED Final   Proteus species NOT DETECTED NOT DETECTED Final   Serratia marcescens NOT DETECTED NOT DETECTED Final   Haemophilus influenzae NOT DETECTED NOT DETECTED Final   Neisseria meningitidis NOT DETECTED NOT DETECTED Final   Pseudomonas aeruginosa NOT DETECTED NOT DETECTED Final   Candida albicans NOT DETECTED NOT DETECTED Final   Candida glabrata NOT DETECTED NOT DETECTED Final   Candida krusei NOT DETECTED NOT DETECTED Final   Candida parapsilosis NOT DETECTED NOT DETECTED Final   Candida tropicalis NOT DETECTED NOT DETECTED Final    Comment: Performed at [facility], [address], [place], [place] [address]  MRSA PCR Screening     Status: Abnormal   Collection Time: 06/11/18 11:55 PM  Result Value Ref Range Status   MRSA by PCR POSITIVE (A) NEGATIVE Final    Comment:        The GeneXpert MRSA Assay (FDA approved for NASAL specimens only), is one component of a comprehensive MRSA colonization surveillance program. It is not intended to diagnose MRSA infection nor to guide or monitor treatment for MRSA infections. RESULT CALLED TO, READ BACK BY AND VERIFIED WITH: L. Sheliah Plane [id number] 06/12/2018 Girtha Hake Performed at [facility], [address], [place], [place] [address]   Culture, blood (routine x 2)     Status: None (Preliminary result)   Collection Time: 06/13/18  2:56 PM  Result Value Ref Range Status   Specimen Description BLOOD RIGHT HAND  Final   Special Requests   Final    BOTTLES  DRAWN AEROBIC AND ANAEROBIC Blood Culture adequate volume   Culture   Final    NO GROWTH 3 DAYS Performed at [facility], [address] [place], [place] [address]    Report Status PENDING  Incomplete  Culture, blood (routine x 2)     Status: None (Preliminary result)   Collection Time: 06/13/18  3:06 PM  Result Value Ref Range Status   Specimen Description BLOOD LEFT HAND  Final   Special Requests   Final    BOTTLES DRAWN AEROBIC AND ANAEROBIC Blood Culture adequate volume   Culture   Final    NO GROWTH 3 DAYS Performed at [facility], [address], [place], [place] [address]    Report Status PENDING  Incomplete  Surgical pcr screen     Status: Abnormal   Collection Time: 06/14/18  1:25 AM  Result Value Ref Range Status   MRSA, PCR POSITIVE (A) NEGATIVE Final    Comment: CRITICAL RESULT CALLED TO, READ BACK BY AND VERIFIED WITH: RN Lucrezia Europe [phone]  THANEY     Staphylococcus aureus POSITIVE (A) NEGATIVE Final    Comment: CRITICAL RESULT CALLED TO, READ BACK BY AND VERIFIED WITH: RN Lucrezia Europe [phone]  THANEY Performed at [facility], [address], [place], [place] [address]          Radiology Studies: No results found.      Scheduled Meds: . atorvastatin  40 mg Oral q1800  . calcium carbonate  1 tablet Oral TID AC  . chlorhexidine  15 mL Mouth/Throat BID  . Chlorhexidine Gluconate Cloth  6 each Topical Q0600  . enoxaparin (LOVENOX) injection  85 mg Subcutaneous BID  . mupirocin ointment  1 application Nasal BID  . pantoprazole  40 mg Oral Q0600  . pregabalin  25 mg Oral Q8H  . sodium chloride flush  3 mL Intravenous Q12H  . sodium chloride flush  3 mL Intravenous Q12H   Continuous Infusions: . sodium chloride 250 mL (06/14/18 0402)  .  ceFAZolin (ANCEF) IV Stopped (06/17/18 0508)  . dextrose 50 mL/hr at 06/17/18 0530  . DOPamine 5 mcg/kg/min (06/17/18 0530)  . vancomycin 1,500 mg (06/15/18 2210)     LOS: 5 days     Time spent: 35 minutes    Alba Cory, MD [facility] Pager [phone]  If 7PM-7AM, please contact night-coverage [email or web address] Password [facility] 06/17/2018, 7:28 AM

## 2018-06-18 ENCOUNTER — Encounter (HOSPITAL_COMMUNITY): Payer: Self-pay | Admitting: *Deleted

## 2018-06-18 ENCOUNTER — Encounter (HOSPITAL_COMMUNITY)
Admission: EM | Disposition: A | Discharge status: Skilled Nursing Facility | Payer: Medicare Other | Source: Skilled Nursing Facility | Attending: Internal Medicine

## 2018-06-18 DIAGNOSIS — R627 Adult failure to thrive: Secondary | ICD-10-CM

## 2018-06-18 DIAGNOSIS — R531 Weakness: Secondary | ICD-10-CM

## 2018-06-18 LAB — CBC
HCT: 35.7 % — ABNORMAL LOW (ref 36.0–46.0)
Hemoglobin: 11.4 g/dL — ABNORMAL LOW (ref 12.0–15.0)
MCH: 25.8 pg — ABNORMAL LOW (ref 26.0–34.0)
MCHC: 31.9 g/dL (ref 30.0–36.0)
MCV: 80.8 fL (ref 80.0–100.0)
Platelets: 146 10*3/uL — ABNORMAL LOW (ref 150–400)
RBC: 4.42 MIL/uL (ref 3.87–5.11)
RDW: 16.8 % — ABNORMAL HIGH (ref 11.5–15.5)
WBC: 9.6 10*3/uL (ref 4.0–10.5)
nRBC: 0 % (ref 0.0–0.2)

## 2018-06-18 LAB — CULTURE, BLOOD (ROUTINE X 2)
Culture: NO GROWTH
Culture: NO GROWTH
Special Requests: ADEQUATE
Special Requests: ADEQUATE

## 2018-06-18 SURGERY — PACEMAKER IMPLANT

## 2018-06-18 MED ORDER — CHLORHEXIDINE GLUCONATE 4 % EX LIQD: 60.0000 mL | Freq: Once | CUTANEOUS | Status: DC

## 2018-06-18 MED ORDER — SODIUM CHLORIDE 0.9 % IV SOLN: 80.0000 mg | INTRAVENOUS | Status: DC

## 2018-06-18 MED ORDER — SODIUM CHLORIDE 0.9 % IV SOLN: INTRAVENOUS | Status: DC

## 2018-06-18 MED ORDER — DOXYCYCLINE HYCLATE 100 MG PO TABS
100.0000 mg | ORAL_TABLET | Freq: Two times a day (BID) | ORAL | Status: DC
  Administered 2018-06-18 – 2018-06-21 (×6): 100 mg via ORAL
  Filled 2018-06-18 (×6): qty 1

## 2018-06-18 MED ORDER — SODIUM CHLORIDE 0.9 % IV SOLN: 250.0000 mL | INTRAVENOUS | Status: DC

## 2018-06-18 MED ORDER — CEFAZOLIN SODIUM-DEXTROSE 2-4 GM/100ML-% IV SOLN: 2.0000 g | INTRAVENOUS | Status: DC

## 2018-06-18 MED ORDER — SODIUM CHLORIDE 0.9% FLUSH: 3.0000 mL | INTRAVENOUS | Status: DC | PRN

## 2018-06-18 NOTE — Progress Notes (Signed)
PROGRESS NOTE  Margaret Collier ZOX:[id number] DOB: [birth year] DOA: 06/11/2018 PCP: System, Pcp Not In   LOS: 6 days   Patient is from: Facility  Brief Narrative / Interim history: 85 year old female with history of hypertension, hyperlipidemia, chronic A. fib on Eliquis, chronic respiratory failure on 2 L by nasal cannula, CVA with residual left hemiparesis, OSA and dementia who was admitted with worsening confusion and speech problem.  At baseline, bedbound.  Facility uses [employer] lift to get her up in the chair.  CT head without acute finding but previous area of stroke.  CT scan of left femur showed 3.5 cm abscess in left medial thigh.  She was also noted to be bradycardic with heart rate in the 40s. Patient was deemed to have no capacity by psychiatry.  Initially the plan was I&D by general surgery, but since the abscess started draining spontaneously, I&D was on hold.  She is followed by cardiology for bradycardia.   Subjective: No major events overnight of this morning.  Has been off dopamine since yesterday a.m.  Heart rate in the 40s with brief pauses without reports of symptoms.  Patient is not a reliable historian.  She complains of pain all over.   Assessment & Plan: Principal Problem:   Evaluation by psychiatric service required Active Problems:   Atrial fibrillation (HCC)   OSA (obstructive sleep apnea)   History of CVA with residual deficit   Bradycardia   Cellulitis and abscess of left leg   AKI (acute kidney injury) (HCC)   Thrombocytopenia (HCC)   Adult failure to thrive   Abscess of left thigh   DNR (do not resuscitate) discussion   Palliative care by specialist   Weakness generalized  Left thigh abscess/cellulitis -Blood cultures grew coag negative staph in 1 out of 4 bottles likely contaminant. -Repeat blood culture on 5/8 without any growth. -No IND for now per surgery's the abscess is spontaneously draining. -On Ancef and vancomycin 5/6-5/13 -Narrowed to  doxycycline 5/13  Acute metabolic encephalopathy in patient with underlying dementia -CT head without acute finding. -Deemed to lack capacity per psychiatry  Bradycardia. fib: presumably asymptomatic.  Not on nodal blocking agents. -Cardiology following  Permanent A. Fib: Not on rate or rhythm control.  On Eliquis for anticoagulation. -On Lovenox for anticoagulation  Chronic thrombocytopenia: Stable  AKI: Resolved.  Baseline 0.8.  Thought to be prerenal. -We will resume home diuretics at low-dose. 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  Chronic diastolic CHF-appears euvolemic -Home diuretics on hold due to AKI  History of other nonhemorrhagic CVA with residual left hemiparesis -CT head here negative for acute finding.  Chronic pain -On oxycodone, Lyrica and Voltaren gel  Goal of care:  -Appreciate palliative care input  Morbid obesity: BMI 36.77 -Appreciate nutrition input  Stage I pressure skin injury over back and buttocks: Present on admission -Air mattress to offload pressure -Frequent turning  Scheduled Meds: . atorvastatin  40 mg Oral q1800  . calcium carbonate  1 tablet Oral TID AC  . chlorhexidine  60 mL Topical Once  . chlorhexidine  60 mL Topical Once  . chlorhexidine  15 mL Mouth/Throat BID  . Chlorhexidine Gluconate Cloth  6 each Topical Q0600  . diclofenac sodium  2 g Topical QID  . enoxaparin (LOVENOX) injection  85 mg Subcutaneous BID  . feeding supplement (ENSURE ENLIVE)  237 mL Oral TID BM  . gentamicin irrigation  80 mg Irrigation  To SSTC  . multivitamin with minerals  1 tablet Oral Daily  . mupirocin ointment  1 application Nasal BID  . pantoprazole  40 mg Oral Q0600  . pregabalin  25 mg Oral Q8H  . sodium chloride flush  3 mL Intravenous Q12H  . sodium chloride flush  3 mL Intravenous Q12H   Continuous Infusions: . sodium chloride 250 mL (06/14/18 0402)  .  sodium chloride    . sodium chloride    .  ceFAZolin (ANCEF) IV 1 g (06/18/18 0937)  .  ceFAZolin (ANCEF) IV    . dextrose 50 mL/hr at 06/18/18 0857  . vancomycin Stopped (06/17/18 1627)   PRN Meds:.sodium chloride, acetaminophen **OR** acetaminophen, albuterol, atropine, ondansetron **OR** ondansetron (ZOFRAN) IV, oxyCODONE, phenol, sodium chloride flush   DVT prophylaxis: On Lovenox for anticoagulation Code Status: Full code Family Communication: Attempted to call patient's niece but no answer.  Did not leave voicemail. Disposition Plan: Remains inpatient pending clinical improvement.  Patient with acute on chronic encephalopathy and abscess  Consultants:   General surgery  Cardiology  Procedures:   None  Microbiology: . Blood culture 5/6 with coag negative staph in 1 out of 4 . MRSA PCR positive . Repeat blood culture on 5/8- negative so far  Antimicrobials: Anti-infectives (From admission, onward)   Start     Dose/Rate Route Frequency Ordered Stop   06/18/18 0800  gentamicin (GARAMYCIN) 80 mg in sodium chloride 0.9 % 500 mL irrigation     80 mg Irrigation To Short Stay 06/18/18 0744 06/19/18 0800   06/18/18 0800  ceFAZolin (ANCEF) IVPB 2g/100 mL premix     2 g 200 mL/hr over 30 Minutes Intravenous To Short Stay 06/18/18 0744 06/19/18 0800   06/17/18 1400  vancomycin (VANCOCIN) IVPB 750 mg/150 ml premix     750 mg 150 mL/hr over 60 Minutes Intravenous Every 24 hours 06/17/18 1311     06/13/18 1200  vancomycin (VANCOCIN) 1,500 mg in sodium chloride 0.9 % 500 mL IVPB  Status:  Discontinued     1,500 mg 250 mL/hr over 120 Minutes Intravenous Every 48 hours 06/11/18 1233 06/17/18 1311   06/11/18 2200  ceFAZolin (ANCEF) IVPB 1 g/50 mL premix     1 g 100 mL/hr over 30 Minutes Intravenous Every 8 hours 06/11/18 1335     06/11/18 1300  vancomycin (VANCOCIN) 1,750 mg in sodium chloride 0.9 % 500 mL IVPB     1,750 mg 250 mL/hr over 120 Minutes Intravenous  Once 06/11/18 1233  06/11/18 1512   06/11/18 1215  ceFAZolin (ANCEF) IVPB 1 g/50 mL premix     1 g 100 mL/hr over 30 Minutes Intravenous  Once 06/11/18 1212 06/11/18 1300   06/11/18 1215  vancomycin (VANCOCIN) IVPB 1000 mg/200 mL premix  Status:  Discontinued     1,000 mg 200 mL/hr over 60 Minutes Intravenous  Once 06/11/18 1212 06/11/18 1233       Objective: Vitals:   06/17/18 0442 06/17/18 1149 06/17/18 2019 06/18/18 0431  BP: (!) 155/73 106/86 (!) 94/49 139/60  Pulse: (!) 45 (!) 46 (!) 41 (!) 49  Resp: 16 17 18 18   Temp: 97.6 F (36.4 C) 98 F (36.7 C) (!) 97.4 F (36.3 C) 97.8 F (36.6 C)  TempSrc: Oral Oral Oral Oral  SpO2: 96% 100% 98% 98%  Weight:    85.4 kg  Height:        Intake/Output Summary (Last 24 hours) at 06/18/2018 0940 Last data filed at 06/18/2018  0830 Gross per 24 hour  Intake 1537.15 ml  Output 400 ml  Net 1137.15 ml   Filed Weights   06/16/18 0538 06/17/18 0227 06/18/18 0431  Weight: 85.1 kg 87 kg 85.4 kg    Examination:  GENERAL: No acute distress.  Appears well.  HEENT: MMM.  Vision and hearing grossly intact.  NECK: Supple.  No apparent JVD. LUNGS:  No IWOB. Good air movement bilaterally. HEART: Heart rate in 40s.  Regular rhythm. Heart sounds normal.  ABD: Bowel sounds present. Soft. Non tender.  MSK/EXT:  Moves all extremities. No apparent deformity.  Trace pitting edema bilaterally SKIN: Erythema over medial aspect of left thigh proximally.  Small purulent drainage on gauze dressing NEURO: Awake, alert and oriented to self and place.  No gross deficit.  PSYCH: Calm. Normal affect.    Data Reviewed: I have independently reviewed following labs and imaging studies  CBC: Recent Labs  Lab 06/14/18 0217 06/15/18 0444 06/16/18 0716 06/17/18 0843 06/18/18 0507  WBC 6.4 8.8 9.4 8.6 9.6  HGB 12.5 12.0 16.0* 12.4 11.4*  HCT 39.5 38.5 48.9* 38.4 35.7*  MCV 81.6 81.7 80.7 81.4 80.8  PLT 149* 146* PLATELET CLUMPS NOTED ON SMEAR, UNABLE TO ESTIMATE 134*  146*   Basic Metabolic Panel: Recent Labs  Lab 06/12/18 0542 06/13/18 0717 06/14/18 0217 06/14/18 0223 06/15/18 0443 06/16/18 0716 06/17/18 0843  NA 141 145 146*  --  140 144 141  K 4.3 4.0 3.6  --  3.8 3.4* 4.0  CL 105 107 110  --  106 110 110  CO2 --  GLUCOSE 90 81 76  --  121* 124* 126*  BUN 37* 27* 19  --  CREATININE 1.14* 0.93 0.76  --  0.70 0.60 0.73  CALCIUM 9.0 9.2 8.8*  --  8.6* 8.6* 8.5*  MG 2.1  --   --  2.0  --   --   --    GFR: Estimated Creatinine Clearance: 50.8 mL/min (by C-G formula based on SCr of 0.73 mg/dL). Liver Function Tests: No results for input(s): AST, ALT, ALKPHOS, BILITOT, PROT, ALBUMIN in the last 168 hours. No results for input(s): LIPASE, AMYLASE in the last 168 hours. No results for input(s): AMMONIA in the last 168 hours. Coagulation Profile: Recent Labs  Lab 06/12/18 0542 06/15/18 0443 06/16/18 0716  INR 2.2* 1.3* 1.3*   Cardiac Enzymes: No results for input(s): CKTOTAL, CKMB, CKMBINDEX, TROPONINI in the last 168 hours. BNP (last 3 results) No results for input(s): PROBNP in the last 8760 hours. HbA1C: No results for input(s): HGBA1C in the last 72 hours. CBG: Recent Labs  Lab 06/14/18 0541 06/14/18 1014 06/15/18 0839  GLUCAP 88 89 136*   Lipid Profile: No results for input(s): CHOL, HDL, LDLCALC, TRIG, CHOLHDL, LDLDIRECT in the last 72 hours. Thyroid Function Tests: No results for input(s): TSH, T4TOTAL, FREET4, T3FREE, THYROIDAB in the last 72 hours. Anemia Panel: No results for input(s): VITAMINB12, FOLATE, FERRITIN, TIBC, IRON, RETICCTPCT in the last 72 hours. Urine analysis:    Component Value Date/Time   COLORURINE YELLOW 06/11/2018 0918   APPEARANCEUR CLEAR 06/11/2018 0918   LABSPEC 1.010 06/11/2018 0918   PHURINE 7.0 06/11/2018 0918   GLUCOSEU NEGATIVE 06/11/2018 0918   HGBUR NEGATIVE 06/11/2018 0918   BILIRUBINUR NEGATIVE 06/11/2018 0918   KETONESUR NEGATIVE 06/11/2018 0918    PROTEINUR NEGATIVE 06/11/2018 0918   NITRITE NEGATIVE 06/11/2018 0918   LEUKOCYTESUR NEGATIVE  06/11/2018 0918   Sepsis Labs: Invalid input(s): PROCALCITONIN, LACTICIDVEN  Recent Results (from the past 240 hour(s))  Culture, blood (routine x 2)     Status: Abnormal   Collection Time: 06/11/18  9:18 AM  Result Value Ref Range Status   Specimen Description BLOOD RIGHT ANTECUBITAL  Final   Special Requests   Final    BOTTLES DRAWN AEROBIC AND ANAEROBIC Blood Culture adequate volume   Culture  Setup Time   Final    GRAM POSITIVE COCCI ANAEROBIC BOTTLE ONLY CRITICAL RESULT CALLED TO, READ BACK BY AND VERIFIED WITH: R. FANNING PHARMD, AT 1334 06/12/18 BY D. VANHOOK    Culture (A)  Final    STAPHYLOCOCCUS SPECIES (COAGULASE NEGATIVE) THE SIGNIFICANCE OF ISOLATING THIS ORGANISM FROM A SINGLE SET OF BLOOD CULTURES WHEN MULTIPLE SETS ARE DRAWN IS UNCERTAIN. PLEASE NOTIFY THE MICROBIOLOGY DEPARTMENT WITHIN ONE WEEK IF SPECIATION AND SENSITIVITIES ARE REQUIRED. Performed at [facility], [address], [place], [place] [address]    Report Status 06/14/2018 FINAL  Final  Culture, blood (routine x 2)     Status: None   Collection Time: 06/11/18  9:18 AM  Result Value Ref Range Status   Specimen Description BLOOD BLOOD RIGHT FOREARM  Final   Special Requests   Final    BOTTLES DRAWN AEROBIC AND ANAEROBIC Blood Culture adequate volume   Culture   Final    NO GROWTH 5 DAYS Performed at [facility], [address] [place], [place] [address]    Report Status 06/16/2018 FINAL  Final  SARS Coronavirus 2 (CEPHEID - Performed in [facility] hospital lab), Hosp Order     Status: None   Collection Time: 06/11/18  9:18 AM  Result Value Ref Range Status   SARS Coronavirus 2 NEGATIVE NEGATIVE Final    Comment: (NOTE) If result is NEGATIVE SARS-CoV-2 target nucleic acids are NOT DETECTED. The SARS-CoV-2 RNA is generally detectable in upper and lower  respiratory specimens during the acute  phase of infection. The lowest  concentration of SARS-CoV-2 viral copies this assay can detect is 250  copies / mL. A negative result does not preclude SARS-CoV-2 infection  and should not be used as the sole basis for treatment or other  patient management decisions.  A negative result may occur with  improper specimen collection / handling, submission of specimen other  than nasopharyngeal swab, presence of viral mutation(s) within the  areas targeted by this assay, and inadequate number of viral copies  (<250 copies / mL). A negative result must be combined with clinical  observations, patient history, and epidemiological information. If result is POSITIVE SARS-CoV-2 target nucleic acids are DETECTED. The SARS-CoV-2 RNA is generally detectable in upper and lower  respiratory specimens dur ing the acute phase of infection.  Positive  results are indicative of active infection with SARS-CoV-2.  Clinical  correlation with patient history and other diagnostic information is  necessary to determine patient infection status.  Positive results do  not rule out bacterial infection or co-infection with other viruses. If result is PRESUMPTIVE POSTIVE SARS-CoV-2 nucleic acids MAY BE PRESENT.   A presumptive positive result was obtained on the submitted specimen  and confirmed on repeat testing.  While 2019 novel coronavirus  (SARS-CoV-2) nucleic acids may be present in the submitted sample  additional confirmatory testing may be necessary for epidemiological  and / or clinical management purposes  to differentiate between  SARS-CoV-2 and other Sarbecovirus currently known to infect humans.  If clinically indicated additional testing with an alternate test  methodology [id number]) is advised. The SARS-CoV-2 RNA is generally  detectable in upper and lower respiratory sp ecimens during the acute  phase of infection. The expected result is Negative. Fact Sheet for Patients:   BoilerBrush.com.cy Fact Sheet for Healthcare Providers: https://pope.com/ This test is not yet approved or cleared by the [place] FDA and has been authorized for detection and/or diagnosis of SARS-CoV-2 by FDA under an Emergency Use Authorization (EUA).  This EUA will remain in effect (meaning this test can be used) for the duration of the COVID-19 declaration under Section 564(b)(1) of the Act, 21 U.S.C. section 360bbb-3(b)(1), unless the authorization is terminated or revoked sooner. Performed at [facility], [address], [place], [place] [address]   Blood Culture ID Panel (Reflexed)     Status: Abnormal   Collection Time: 06/11/18  9:18 AM  Result Value Ref Range Status   Enterococcus species NOT DETECTED NOT DETECTED Final   Listeria monocytogenes NOT DETECTED NOT DETECTED Final   Staphylococcus species DETECTED (A) NOT DETECTED Final    Comment: Methicillin (oxacillin) susceptible coagulase negative staphylococcus. Possible blood culture contaminant (unless isolated from more than one blood culture draw or clinical case suggests pathogenicity). No antibiotic treatment is indicated for blood  culture contaminants. CRITICAL RESULT CALLED TO, READ BACK BY AND VERIFIED WITH: R. FANNING PHARMD, AT 1334 06/12/18 BY D. VANHOOK    Staphylococcus aureus (BCID) NOT DETECTED NOT DETECTED Final   Methicillin resistance NOT DETECTED NOT DETECTED Final   Streptococcus species NOT DETECTED NOT DETECTED Final   Streptococcus agalactiae NOT DETECTED NOT DETECTED Final   Streptococcus pneumoniae NOT DETECTED NOT DETECTED Final   Streptococcus pyogenes NOT DETECTED NOT DETECTED Final   Acinetobacter baumannii NOT DETECTED NOT DETECTED Final   Enterobacteriaceae species NOT DETECTED NOT DETECTED Final   Enterobacter cloacae complex NOT DETECTED NOT DETECTED Final   Escherichia coli NOT DETECTED NOT DETECTED Final   Klebsiella oxytoca NOT  DETECTED NOT DETECTED Final   Klebsiella pneumoniae NOT DETECTED NOT DETECTED Final   Proteus species NOT DETECTED NOT DETECTED Final   Serratia marcescens NOT DETECTED NOT DETECTED Final   Haemophilus influenzae NOT DETECTED NOT DETECTED Final   Neisseria meningitidis NOT DETECTED NOT DETECTED Final   Pseudomonas aeruginosa NOT DETECTED NOT DETECTED Final   Candida albicans NOT DETECTED NOT DETECTED Final   Candida glabrata NOT DETECTED NOT DETECTED Final   Candida krusei NOT DETECTED NOT DETECTED Final   Candida parapsilosis NOT DETECTED NOT DETECTED Final   Candida tropicalis NOT DETECTED NOT DETECTED Final    Comment: Performed at [facility], [address] [place], [place] [address]  MRSA PCR Screening     Status: Abnormal   Collection Time: 06/11/18 11:55 PM  Result Value Ref Range Status   MRSA by PCR POSITIVE (A) NEGATIVE Final    Comment:        The GeneXpert MRSA Assay (FDA approved for NASAL specimens only), is one component of a comprehensive MRSA colonization surveillance program. It is not intended to diagnose MRSA infection nor to guide or monitor treatment for MRSA infections. RESULT CALLED TO, READ BACK BY AND VERIFIED WITH: L. Sheliah Plane 0623 06/12/2018 Girtha Hake Performed at [facility], [address], [place], [place] [address]   Culture, blood (routine x 2)     Status: None (Preliminary result)   Collection Time: 06/13/18  2:56 PM  Result Value Ref  Range Status   Specimen Description BLOOD RIGHT HAND  Final   Special Requests   Final    BOTTLES DRAWN AEROBIC AND ANAEROBIC Blood Culture adequate volume   Culture   Final    NO GROWTH 4 DAYS Performed at [facility], [address] [place], [place] [address]    Report Status PENDING  Incomplete  Culture, blood (routine x 2)     Status: None (Preliminary result)   Collection Time: 06/13/18  3:06 PM  Result Value Ref Range Status   Specimen Description BLOOD LEFT HAND  Final   Special  Requests   Final    BOTTLES DRAWN AEROBIC AND ANAEROBIC Blood Culture adequate volume   Culture   Final    NO GROWTH 4 DAYS Performed at [facility], [address] [place], [place] [address]    Report Status PENDING  Incomplete  Surgical pcr screen     Status: Abnormal   Collection Time: 06/14/18  1:25 AM  Result Value Ref Range Status   MRSA, PCR POSITIVE (A) NEGATIVE Final    Comment: CRITICAL RESULT CALLED TO, READ BACK BY AND VERIFIED WITH: RN Lucrezia EuropeH. HAMLIN [id number] @0322  THANEY     Staphylococcus aureus POSITIVE (A) NEGATIVE Final    Comment: CRITICAL RESULT CALLED TO, READ BACK BY AND VERIFIED WITH: RN Lucrezia EuropeH. HAMLIN [id number] @0322  THANEY Performed at [facility], [address] [place], [place] [address]       Radiology Studies: No results found.  Jolleen Seman T. [facility] Pager [phone]  If 7PM-7AM, please contact night-coverage [email or web address] Password [email or web address] 06/18/2018, 9:40 AM

## 2018-06-18 NOTE — Progress Notes (Signed)
   06/18/18 0823  Clinical Encounter Type  Visited With Patient  Visit Type Initial;Spiritual support  Referral From Palliative care team  Consult/Referral To Chaplain  Spiritual Encounters  Spiritual Needs Prayer  Stress Factors  Patient Stress Factors Family relationships;Exhausted  This chaplain was pastorally present with the Pt.  The chaplain listened as the Pt. shared the levels of pain on her left side kept her from sleeping and questioning her quality of life. At the same time, the Pt. stated she is afraid of dying and will proceed with the pacemaker. The chaplain heard the Pt. desires to talk to her husband and have a companion in her healthcare journey.  The Pt. explained her faith is not strong enough to completely trust God.  This chaplain recommends F/U spiritual care visits with the Pt.

## 2018-06-19 LAB — BASIC METABOLIC PANEL
Anion gap: 9 (ref 5–15)
BUN: 10 mg/dL (ref 8–23)
CO2: 24 mmol/L (ref 22–32)
Calcium: 8.6 mg/dL — ABNORMAL LOW (ref 8.9–10.3)
Chloride: 110 mmol/L (ref 98–111)
Creatinine, Ser: 0.66 mg/dL (ref 0.44–1.00)
GFR calc Af Amer: >60 mL/min (ref >60)
GFR calc non Af Amer: >60 mL/min (ref >60)
Glucose, Bld: 87 mg/dL (ref 70–99)
Potassium: 3.9 mmol/L (ref 3.5–5.1)
Sodium: 143 mmol/L (ref 135–145)

## 2018-06-19 LAB — CBC
HCT: 36.2 % (ref 36.0–46.0)
Hemoglobin: 11.3 g/dL — ABNORMAL LOW (ref 12.0–15.0)
MCH: 25.8 pg — ABNORMAL LOW (ref 26.0–34.0)
MCHC: 31.2 g/dL (ref 30.0–36.0)
MCV: 82.6 fL (ref 80.0–100.0)
Platelets: 147 10*3/uL — ABNORMAL LOW (ref 150–400)
RBC: 4.38 MIL/uL (ref 3.87–5.11)
RDW: 17.4 % — ABNORMAL HIGH (ref 11.5–15.5)
WBC: 7.2 10*3/uL (ref 4.0–10.5)
nRBC: 0 % (ref 0.0–0.2)

## 2018-06-19 LAB — MAGNESIUM: Magnesium: 1.9 mg/dL (ref 1.7–2.4)

## 2018-06-19 MED ORDER — FUROSEMIDE 40 MG PO TABS
40.0000 mg | ORAL_TABLET | Freq: Every day | ORAL | Status: DC
  Administered 2018-06-19 – 2018-06-21 (×3): 40 mg via ORAL
  Filled 2018-06-19 (×2): qty 1

## 2018-06-19 NOTE — Progress Notes (Signed)
PT Cancellation Note  Patient Details Name: Margaret Collier MRN: [id number] DOB: 1931/07/04   Cancelled Treatment:    Reason Eval/Treat Not Completed: Active bedrest order. Please d/c this order if appropriate to proceed with PT evaluation.  Ina Homes, PT, DPT Acute Rehabilitation Services  Pager [phone] Office [phone]  Malachy Chamber 06/19/2018, 8:10 AM

## 2018-06-19 NOTE — Progress Notes (Signed)
Palliative Medicine RN Note: Rec'd a call from pt's niece requesting a video call. Per charge RN, 3E does not have the capability of facilitating a video call. I set up a Skype call through the PMT account. During the call, Ms Vacha repeatedly asked for pain medicine and reported that she needed to be cleaned up d/t having BM in the bed. Terminated call early so pt could have those needs met. I spoke w RN at the desk to request they help pt with pain meds and personal care.  We will attempt another Skype call tomorrow, if it can be arranged with her niece.  Marjie Skiff Izmael Duross, RN, BSN, [facility] Palliative Medicine Team 06/19/2018 12:06 PM Office [phone]

## 2018-06-19 NOTE — Discharge Summary (Signed)
Physician Discharge Summary  Margaret Collier HXT:[id number] DOB: 01/01/1932 DOA: 06/11/2018  PCP: System, Pcp Not In  Admit date: 06/11/2018 Discharge date: 06/20/2018  Admitted From: Facility Disposition: SNF  Recommendations for Outpatient Follow-up:  1. Follow up with PCP in 1-2 weeks 2. Palliative care follow-up at SNF 3. Daily dressing of wound 4. Please obtain CBC/BMP/Mag at follow up 5. Please follow up on the following pending results: None   Discharge Condition: Stable CODE STATUS: Full code  Hospital Course: 85-year-old female with history of hypertension, hyperlipidemia, chronic A. fib on Eliquis, chronic respiratory failure on 2 L by nasal cannula, CVA with residual left hemiparesis, OSA and dementia who was admitted with worsening confusion and speech problem.  At baseline, bed-bound.  Facility uses [employer] lift to get her up in the chair.  CT head without acute finding but previous area of stroke.  CT scan of left femur showed 3.5 cm abscess in left medial thigh.  She was also noted to be bradycardic with heart rate in the 40s.   General surgery was consulted about abscess. However, patient refused surgical procedure. So psychiatry was consulted and concluded that she has no capacity to make her own medical decision. General surgery was also consulted about her bradycardia. Cardiology, EP was consulted. She is not on nodal blocking agents. Patient didn't appear to be symptomatic from her bradycardia. She is also bed bound at baseline. So cardiology didn't feel she need a pacemaker and signed off. General surgery also signed off eventually as the abscess started draining spontaneously.  Patient was treated with Ancef and vancomycin. Blood cultures returned negative. She was transitioned to p.o. doxycycline to complete the course.  Palliative care was also consulted and met with niece over the phone.  See individual problem list below for more.  Discharge Diagnoses:  Left  thigh abscess/cellulitis -Blood cultures grew coag negative staph in 1 out of 4 bottles likely contaminant. -Repeat blood culture on 5/8 without any growth. -No I&D per surgery's as the abscess has started draining spontaneously. -On Ancef and vancomycin 5/6-5/13 -Narrowed to doxycycline 5/13-5/20 -Daily dressing of the wound.  Acute metabolic encephalopathy in patient with underlying dementia: reportedly back to baseline per patient's niece.  -CT head without acute finding. -Deemed to lack capacity per psychiatry  Bradycardia: presumably asymptomatic.  Not on nodal blocking agents. Bedboud at baseline. -Cardiology signed off.  Permanent A. Fib: Not on rate or rhythm control.  On Eliquis at home. -On Lovenox for anticoagulation  Chronic thrombocytopenia: Stable  AKI: Resolved.  Baseline 0.8.  Thought to be prerenal. She is also on high dose diuretics. 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 -Conservative care with pain meds  Chronic diastolic CHF-appears euvolemic.  On high-dose Lasix at home. -Resumed lasix at 40 mg daily -reassess fluid status and renal function at follow up and adjust diuretics as appropriate  History of other nonhemorrhagic CVA with residual left hemiparesis -CT head here negative for acute finding.  Chronic pain -Scheduled tylenol and as needed oxycodone  Goal of care:  -Appreciate palliative care input -recommend palliative care follow up at SNF  Morbid obesity: BMI 36.77 -Appreciate nutrition input -Ensure supplement to enhance wound healing during acute illness  Stage I pressure skin injury over back and buttocks: present on admission -Air mattress to offload pressure -Frequent turning   Discharge Instructions  Discharge Instructions  Call MD for:  difficulty breathing, headache or visual disturbances    Complete by:  As directed    Call MD for:  persistant dizziness or light-headedness   Complete by:  As directed    Call MD for:  redness, tenderness, or signs of infection (pain, swelling, redness, odor or green/yellow discharge around incision site)   Complete by:  As directed    Call MD for:  severe uncontrolled pain   Complete by:  As directed    Call MD for:  temperature >100.4   Complete by:  As directed    Diet general   Complete by:  As directed    Increase activity slowly   Complete by:  As directed      Allergies as of 06/20/2018      Reactions   Zanaflex [tizanidine Hcl] Shortness Of Breath, Other (See Comments), Cough   Dehydration - reported by [facility] 10/07/13   Chlorthalidone Other (See Comments)   syncope   Dilaudid [hydromorphone Hcl] Hives, Swelling   Fosamax [alendronate Sodium] Nausea And Vomiting, Other (See Comments)   Back and hip pain   Lovastatin Other (See Comments)   Weakness - reported by [facility] 05/13/14   Statins Other (See Comments)   Myalgia, weakness - reported by [facility] 05/13/14   Alprazolam Other (See Comments)   Dry mouth - reported by [facility] 05/13/14   Carisoprodol Other (See Comments)   Dizziness - reported by [facility] 05/13/14   Colesevelam Nausea And Vomiting, Other (See Comments)   GI upset and stool change - reported by [facility] 05/13/14   Duloxetine Other (See Comments)   Dizziness - reported by [facility] 05/13/14   Felodipine Other (See Comments)   Dizziness, groin pain - reported by [facility] 05/13/14   Furosemide Other (See Comments)   Dry mouth/dizziness - reported by [facility] 05/13/14   Gabapentin Other (See Comments)   Insomnia - reported by [facility] 05/13/14   Hydrocodone-acetaminophen Nausea And Vomiting   reported by [facility] 05/13/14   Ibuprofen Other (See Comments)   GI pain - reported by [facility] 05/13/14   Meclizine Other (See Comments)   Sleeplessness  - reported by [facility] 05/13/14   Meperidine Other (See Comments)   Jitteriness - reported by [facility] 05/13/14   Methadone Other (See Comments)   Stool impaction - reported by [facility] 05/13/14   Morphine Other (See Comments)   Dizziness - reported by [facility] 05/13/14   Omeprazole Other (See Comments)   Sleeplessness - reported by [facility] 05/13/14   Oxycodone Other (See Comments)   Groin pain - reported by [facility] 05/13/14   Pravastatin Other (See Comments)   Weakness - reported by [facility] 05/13/14   Simvastatin Other (See Comments)   Weakness - reported by [facility] 05/13/14   Tramadol Anxiety, Other (See Comments)   Nervous - reported by [facility] 05/13/14   Valsartan Other (See Comments)   Dizziness - reported by [facility] 05/13/14      Medication List    STOP taking these medications   cephALEXin 500 MG capsule Commonly known as:  KEFLEX   oxyCODONE-acetaminophen 10-325 MG tablet Commonly known as:  PERCOCET     TAKE these medications   acetaminophen 500 MG tablet Commonly known as:  TYLENOL Take  2 tablets (1,000 mg total) by mouth every 8 (eight) hours.   apixaban 5 MG Tabs tablet Commonly known as:  ELIQUIS Take 1 tablet (5 mg total) by mouth 2 (two) times daily.   atorvastatin 40 MG tablet Commonly known as:  LIPITOR Take 40 mg by mouth daily at 6 PM.   b complex vitamins capsule Take 1 capsule by mouth daily.   calcium carbonate 500 MG chewable tablet Commonly known as:  TUMS - dosed in mg elemental calcium Chew 1 tablet by mouth 3 (three) times daily before meals.   Chloraseptic 1.4 % Liqd Generic drug:  phenol Use as directed 2 sprays in the mouth or throat every 2 (two) hours as needed for throat irritation / pain. x48   chlorhexidine 0.12 % solution Commonly known as:  PERIDEX Use as directed 15 mLs in the mouth or throat 2 (two) times daily.   cholecalciferol 25 MCG (1000 UT) tablet Commonly  known as:  VITAMIN D3 Take 1,000 Units by mouth daily.   doxycycline 100 MG tablet Commonly known as:  VIBRA-TABS Take 1 tablet (100 mg total) by mouth every 12 (twelve) hours.   feeding supplement (ENSURE ENLIVE) Liqd Take 237 mLs by mouth 3 (three) times daily between meals.   furosemide 40 MG tablet Commonly known as:  LASIX Take 40 mg by mouth daily. What changed:  Another medication with the same name was removed. Continue taking this medication, and follow the directions you see here.   oxyCODONE 5 MG immediate release tablet Commonly known as:  Roxicodone Take 1 tablet (5 mg total) by mouth every 6 (six) hours as needed for severe pain or breakthrough pain.   OXYGEN Inhale 2 L/min into the lungs See admin instructions. Apply via nasal canula daily at bedtime and remove every morning   pantoprazole 40 MG tablet Commonly known as:  PROTONIX Take 40 mg by mouth daily at 6 (six) AM.   polyethylene glycol 17 g packet Commonly known as:  MIRALAX / GLYCOLAX Take 17 g by mouth daily as needed for moderate constipation.   potassium chloride SA 20 MEQ tablet Commonly known as:  K-DUR Take 20 mEq by mouth daily.   pregabalin 25 MG capsule Commonly known as:  LYRICA Take 25 mg by mouth every 8 (eight) hours.   spironolactone 25 MG tablet Commonly known as:  ALDACTONE Take 0.5 tablets (12.5 mg total) by mouth daily.   Ventolin HFA 108 (90 Base) MCG/ACT inhaler Generic drug:  albuterol Inhale 2 puffs into the lungs every 4 (four) hours as needed for wheezing or shortness of breath.       Contact information for follow-up providers    Blazejewski, Dimple Casey, PA-C. Schedule an appointment as soon as possible for a visit in 1 week(s).   Specialty:  Cardiology Contact information: [place] [address] [phone]            Contact information for after-discharge care    Destination    [facility] .    Service:  Skilled Nursing Contact information: [address] [place] [phone]                  Consultations:  Cardiology/EP  General Surgery  Palliative  Procedures/Studies:  2D Echo: none  Ct Head Wo Contrast  Result Date: 06/11/2018 CLINICAL DATA:  Left-sided weakness. Left-sided facial droop. Worsened confusion. EXAM: CT HEAD WITHOUT CONTRAST TECHNIQUE: Contiguous axial images were  obtained from the base of the skull through the vertex without intravenous contrast. COMPARISON:  10/05/2017 FINDINGS: Brain: The brainstem and cerebellum are unremarkable. Left cerebral hemisphere shows minimal small vessel change of the white matter. On the right, there has been old infarction in the right basal ganglia and insula which has progressed to atrophy, encephalomalacia and gliosis. There is old appearing infarction in the right parietal lobe and temporoparietal junction, which was not visibly affected in August of last year. This does not appear acute however. There is no identifiable acute infarction. No mass lesion, hemorrhage, hydrocephalus or extra-axial collection. Vascular: There is atherosclerotic calcification of the major vessels at the base of the brain. Skull: Negative Sinuses/Orbits: Some fluid layering in the right division of the sphenoid sinus. Other sinuses are clear. Orbits are negative. 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 The area of involvement is more extensive than was seen in August of last year, with more involvement of the temporoparietal junction and posterior parietal lobe, but the findings do not look acute today. No hemorrhage or mass effect. Electronically Signed   By: Nelson Chimes M.D.   On: 06/11/2018 10:17   Ct Femur Left W Contrast  Result Date: 06/11/2018 CLINICAL DATA:  Left thigh swelling and pain. Concern for soft tissue infection. EXAM: CT OF THE  LOWER RIGHT EXTREMITY WITH CONTRAST TECHNIQUE: Multidetector CT imaging of the lower right extremity was performed according to the standard protocol following intravenous contrast administration. COMPARISON:  Left femur x-rays from same day. CONTRAST:  117m OMNIPAQUE IOHEXOL 300 MG/ML  SOLN FINDINGS: Bones/Joint/Cartilage No acute fracture or dislocation. No osseous destruction or periosteal reaction. Sclerosis within the left femoral head. Moderate left hip joint space narrowing. Prior left total knee arthroplasty. No evidence of hardware failure or loosening. No hip or knee joint effusion. Ligaments Suboptimally assessed by CT. Muscles and Tendons Grossly intact. Severe left gluteus medius muscle atrophy. Unchanged small lipoma in the proximal lateral gastrocnemius muscle. Soft tissues In the superficial medial aspect of the left mid thigh, there is a 3.5 x 2.0 by 1.8 cm complex, predominantly low-density lesion with thick rim enhancement. There is prominent overlying skin thickening of the medial thigh and mild surrounding fat stranding. No subcutaneous emphysema. Large stool ball in the rectum.  No lymphadenopathy. IMPRESSION: 1. 3.5 cm complex, predominantly low-density lesion with thick rim enhancement in the superficial medial aspect of the left mid thigh. Appearance is most suggestive of an abscess given overlying skin thickening and surrounding soft tissue inflammatory changes suggestive of cellulitis, however a necrotic mass could have a similar appearance. 2. No subcutaneous emphysema or deeper soft tissue infection. 3. 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 Electronically Signed   By: WTitus DubinM.D.   On: 06/11/2018 11:55   UKoreaLt Lower Extrem Ltd Soft Tissue Non Vascular  Result Date: 06/14/2018 CLINICAL DATA:  Superficial medial left thigh abscess by CT EXAM: ULTRASOUND LEFT LOWER EXTREMITY LIMITED TECHNIQUE: Ultrasound examination  of the lower extremity soft tissues was performed in the area of clinical concern. COMPARISON:  06/11/2018 CT FINDINGS: Medial left thigh area of concern demonstrates localized skin thickening. Deep to the skin, there is a superficial subcutaneous complex fluid collection measuring 1.6 x 2.0 x 3.4 cm. Internal contents are heterogeneous and hypoechoic. This correlates with the CT finding. IMPRESSION: 3.4 cm left medial thigh subcutaneous complex fluid collection compatible with abscess. Electronically  Signed   By: Jerilynn Mages.  Shick M.D.   On: 06/14/2018 14:31   Dg Femur Portable Min 2 Views Left  Result Date: 06/11/2018 CLINICAL DATA:  Left medial thigh infection. EXAM: LEFT FEMUR PORTABLE 2 VIEWS COMPARISON:  None. FINDINGS: One-view does not show any bone abnormality or radiopaque foreign object. IMPRESSION: No specific or significant finding on this one view examination. Electronically Signed   By: Nelson Chimes M.D.   On: 06/11/2018 09:58     Subjective: No major events overnight. Feels much better today. Pain is fairly controlled. Denies chest pain, shortness of breath, GI and GU symptoms. Excited to go back to facility. Updated patient's niece over the phone about the discharge planning today.  Discharge Exam: Vitals:   06/19/18 2105 06/20/18 0501  BP: 120/75 122/61  Pulse: (!) 50 (!) 37  Resp: 18 20  Temp: 97.9 F (36.6 C) (!) 97.5 F (36.4 C)  SpO2: 96% 100%   GENERAL: No acute distress.  Appears well.  HEENT: MMM.  Vision and hearing grossly intact.  NECK: Supple.  No apparent JVD LUNGS:  No IWOB.  Fair air movement bilaterally HEART: Irregular rhythm.  Bradycardic to 40s.  S1 and S2 audible. ABD: Bowel sounds present. Soft. Non tender.  MSK/EXT: Left hemiparesis.  Moves right upper and lower extremities.  SKIN: Erythema with central opening over medial aspect of left eye proximally.  See picture below for more NEURO: Awake, alert and oriented to self and place only.  Left hemiparesis.  PSYCH: Calm. Normal affect.   The results of significant diagnostics from this hospitalization (including imaging, microbiology, ancillary and laboratory) are listed below for reference.     Microbiology: Recent Results (from the past 240 hour(s))  Culture, blood (routine x 2)     Status: Abnormal   Collection Time: 06/11/18  9:18 AM  Result Value Ref Range Status   Specimen Description BLOOD RIGHT ANTECUBITAL  Final   Special Requests   Final    BOTTLES DRAWN AEROBIC AND ANAEROBIC Blood Culture adequate volume   Culture  Setup Time   Final    GRAM POSITIVE COCCI ANAEROBIC BOTTLE ONLY CRITICAL RESULT CALLED TO, READ BACK BY AND VERIFIED WITH: R. FANNING PHARMD, AT 1334 06/12/18 BY D. VANHOOK    Culture (A)  Final    STAPHYLOCOCCUS SPECIES (COAGULASE NEGATIVE) THE SIGNIFICANCE OF ISOLATING THIS ORGANISM FROM A SINGLE SET OF BLOOD CULTURES WHEN MULTIPLE SETS ARE DRAWN IS UNCERTAIN. PLEASE NOTIFY THE MICROBIOLOGY DEPARTMENT WITHIN ONE WEEK IF SPECIATION AND SENSITIVITIES ARE REQUIRED. Performed at [facility], [place] [address], [place], [place] [address]    Report Status 06/14/2018 FINAL  Final  Culture, blood (routine x 2)     Status: None   Collection Time: 06/11/18  9:18 AM  Result Value Ref Range Status   Specimen Description BLOOD BLOOD RIGHT FOREARM  Final   Special Requests   Final    BOTTLES DRAWN AEROBIC AND ANAEROBIC Blood Culture adequate volume   Culture   Final    NO GROWTH 5 DAYS Performed at [facility], [place] [address] [place], [place] [address]    Report Status 06/16/2018 FINAL  Final  SARS Coronavirus 2 (CEPHEID - Performed in [facility] hospital lab), Hosp Order     Status: None   Collection Time: 06/11/18  9:18 AM  Result Value Ref Range Status   SARS Coronavirus 2 NEGATIVE NEGATIVE Final    Comment: (NOTE) If result is NEGATIVE SARS-CoV-2 target  nucleic acids are NOT DETECTED. The SARS-CoV-2 RNA is generally detectable in upper and lower   respiratory specimens during the acute phase of infection. The lowest  concentration of SARS-CoV-2 viral copies this assay can detect is 250  copies / mL. A negative result does not preclude SARS-CoV-2 infection  and should not be used as the sole basis for treatment or other  patient management decisions.  A negative result may occur with  improper specimen collection / handling, submission of specimen other  than nasopharyngeal swab, presence of viral mutation(s) within the  areas targeted by this assay, and inadequate number of viral copies  (<250 copies / mL). A negative result must be combined with clinical  observations, patient history, and epidemiological information. If result is POSITIVE SARS-CoV-2 target nucleic acids are DETECTED. The SARS-CoV-2 RNA is generally detectable in upper and lower  respiratory specimens dur ing the acute phase of infection.  Positive  results are indicative of active infection with SARS-CoV-2.  Clinical  correlation with patient history and other diagnostic information is  necessary to determine patient infection status.  Positive results do  not rule out bacterial infection or co-infection with other viruses. If result is PRESUMPTIVE POSTIVE SARS-CoV-2 nucleic acids MAY BE PRESENT.   A presumptive positive result was obtained on the submitted specimen  and confirmed on repeat testing.  While 2019 novel coronavirus  (SARS-CoV-2) nucleic acids may be present in the submitted sample  additional confirmatory testing may be necessary for epidemiological  and / or clinical management purposes  to differentiate between  SARS-CoV-2 and other Sarbecovirus currently known to infect humans.  If clinically indicated additional testing with an alternate test  methodology [phone]) is advised. The SARS-CoV-2 RNA is generally  detectable in upper and lower respiratory sp ecimens during the acute  phase of infection. The expected result is Negative. Fact  Sheet for Patients:  StrictlyIdeas.no Fact Sheet for Healthcare Providers: BankingDealers.co.za This test is not yet approved or cleared by the [place] FDA and has been authorized for detection and/or diagnosis of SARS-CoV-2 by FDA under an Emergency Use Authorization (EUA).  This EUA will remain in effect (meaning this test can be used) for the duration of the COVID-19 declaration under Section 564(b)(1) of the Act, 21 U.S.C. section 360bbb-3(b)(1), unless the authorization is terminated or revoked sooner. Performed at [facility], [place] [address], [place], [place] [address]   Blood Culture ID Panel (Reflexed)     Status: Abnormal   Collection Time: 06/11/18  9:18 AM  Result Value Ref Range Status   Enterococcus species NOT DETECTED NOT DETECTED Final   Listeria monocytogenes NOT DETECTED NOT DETECTED Final   Staphylococcus species DETECTED (A) NOT DETECTED Final    Comment: Methicillin (oxacillin) susceptible coagulase negative staphylococcus. Possible blood culture contaminant (unless isolated from more than one blood culture draw or clinical case suggests pathogenicity). No antibiotic treatment is indicated for blood  culture contaminants. CRITICAL RESULT CALLED TO, READ BACK BY AND VERIFIED WITH: R. FANNING PHARMD, AT 1334 06/12/18 BY D. VANHOOK    Staphylococcus aureus (BCID) NOT DETECTED NOT DETECTED Final   Methicillin resistance NOT DETECTED NOT DETECTED Final   Streptococcus species NOT DETECTED NOT DETECTED Final   Streptococcus agalactiae NOT DETECTED NOT DETECTED Final   Streptococcus pneumoniae NOT DETECTED NOT DETECTED Final   Streptococcus pyogenes NOT DETECTED NOT DETECTED Final   Acinetobacter baumannii NOT DETECTED NOT DETECTED Final   Enterobacteriaceae species NOT DETECTED NOT DETECTED Final  Enterobacter cloacae complex NOT DETECTED NOT DETECTED Final   Escherichia coli NOT DETECTED NOT DETECTED Final    Klebsiella oxytoca NOT DETECTED NOT DETECTED Final   Klebsiella pneumoniae NOT DETECTED NOT DETECTED Final   Proteus species NOT DETECTED NOT DETECTED Final   Serratia marcescens NOT DETECTED NOT DETECTED Final   Haemophilus influenzae NOT DETECTED NOT DETECTED Final   Neisseria meningitidis NOT DETECTED NOT DETECTED Final   Pseudomonas aeruginosa NOT DETECTED NOT DETECTED Final   Candida albicans NOT DETECTED NOT DETECTED Final   Candida glabrata NOT DETECTED NOT DETECTED Final   Candida krusei NOT DETECTED NOT DETECTED Final   Candida parapsilosis NOT DETECTED NOT DETECTED Final   Candida tropicalis NOT DETECTED NOT DETECTED Final    Comment: Performed at [facility], [place] [address], [place], [place] [address]  MRSA PCR Screening     Status: Abnormal   Collection Time: 06/11/18 11:55 PM  Result Value Ref Range Status   MRSA by PCR POSITIVE (A) NEGATIVE Final    Comment:        The GeneXpert MRSA Assay (FDA approved for NASAL specimens only), is one component of a comprehensive MRSA colonization surveillance program. It is not intended to diagnose MRSA infection nor to guide or monitor treatment for MRSA infections. RESULT CALLED TO, READ BACK BY AND VERIFIED WITH: L. Lanier Clam [id number] 06/12/2018 Mena Goes Performed at [facility], [place] [address] [place], [place] [address]   Culture, blood (routine x 2)     Status: None   Collection Time: 06/13/18  2:56 PM  Result Value Ref Range Status   Specimen Description BLOOD RIGHT HAND  Final   Special Requests   Final    BOTTLES DRAWN AEROBIC AND ANAEROBIC Blood Culture adequate volume   Culture   Final    NO GROWTH 5 DAYS Performed at [facility], [place] [address], [place], [place] [address]    Report Status 06/18/2018 FINAL  Final  Culture, blood (routine x 2)     Status: None   Collection Time: 06/13/18  3:06 PM  Result Value Ref Range Status   Specimen Description BLOOD LEFT HAND  Final   Special Requests    Final    BOTTLES DRAWN AEROBIC AND ANAEROBIC Blood Culture adequate volume   Culture   Final    NO GROWTH 5 DAYS Performed at [facility], [place] [address] [place], [place] [address]    Report Status 06/18/2018 FINAL  Final  Surgical pcr screen     Status: Abnormal   Collection Time: 06/14/18  1:25 AM  Result Value Ref Range Status   MRSA, PCR POSITIVE (A) NEGATIVE Final    Comment: CRITICAL RESULT CALLED TO, READ BACK BY AND VERIFIED WITH: RN Audry Pili [phone] @0322  THANEY     Staphylococcus aureus POSITIVE (A) NEGATIVE Final    Comment: CRITICAL RESULT CALLED TO, READ BACK BY AND VERIFIED WITH: RN Audry Pili [phone] @[id number]  THANEY Performed at [facility] [address], [place], [place] [address]      Labs: BNP (last 3 results) Recent Labs    01/17/18 1647  BNP 244.0*   Basic Metabolic Panel: Recent Labs  Lab 06/14/18 0223 06/15/18 0443 06/16/18 0716 06/17/18 0843 06/19/18 0557 06/20/18 0353  NA  --  140 144 141 143 140  K  --  3.8 3.4* 4.0 3.9 4.0  CL  --  106 110 110 110 107  CO2  --  22 23 23 24 24   GLUCOSE  --  121* 124* 126* 87 81  BUN  --  17 9 8 10 10   CREATININE  --  0.70 0.60 0.73 0.66 0.71  CALCIUM  --  8.6* 8.6* 8.5* 8.6* 8.3*  MG 2.0  --   --   --  1.9 1.9   Liver Function Tests: No results for input(s): AST, ALT, ALKPHOS, BILITOT, PROT, ALBUMIN in the last 168 hours. No results for input(s): LIPASE, AMYLASE in the last 168 hours. No results for input(s): AMMONIA in the last 168 hours. CBC: Recent Labs  Lab 06/15/18 0444 06/16/18 0716 06/17/18 0843 06/18/18 0507 06/19/18 0557  WBC 8.8 9.4 8.6 9.6 7.2  HGB 12.0 16.0* 12.4 11.4* 11.3*  HCT 38.5 48.9* 38.4 35.7* 36.2  MCV 81.7 80.7 81.4 80.8 82.6  PLT 146* PLATELET CLUMPS NOTED ON SMEAR, UNABLE TO ESTIMATE 134* 146* 147*   Cardiac Enzymes: No results for input(s): CKTOTAL, CKMB, CKMBINDEX, TROPONINI in the last 168 hours. BNP: Invalid input(s): POCBNP CBG: Recent Labs   Lab 06/14/18 0541 06/14/18 1014 06/15/18 0839  GLUCAP 88 89 136*   D-Dimer No results for input(s): DDIMER in the last 72 hours. Hgb A1c No results for input(s): HGBA1C in the last 72 hours. Lipid Profile No results for input(s): CHOL, HDL, LDLCALC, TRIG, CHOLHDL, LDLDIRECT in the last 72 hours. Thyroid function studies No results for input(s): TSH, T4TOTAL, T3FREE, THYROIDAB in the last 72 hours.  Invalid input(s): FREET3 Anemia work up No results for input(s): VITAMINB12, FOLATE, FERRITIN, TIBC, IRON, RETICCTPCT in the last 72 hours. Urinalysis    Component Value Date/Time   COLORURINE YELLOW 06/11/2018 0918   APPEARANCEUR CLEAR 06/11/2018 0918   LABSPEC 1.010 06/11/2018 0918   PHURINE 7.0 06/11/2018 0918   GLUCOSEU NEGATIVE 06/11/2018 0918   HGBUR NEGATIVE 06/11/2018 0918   BILIRUBINUR NEGATIVE 06/11/2018 0918   KETONESUR NEGATIVE 06/11/2018 0918   PROTEINUR NEGATIVE 06/11/2018 0918   NITRITE NEGATIVE 06/11/2018 0918   LEUKOCYTESUR NEGATIVE 06/11/2018 0918   Sepsis Labs Invalid input(s): PROCALCITONIN,  WBC,  LACTICIDVEN   Time coordinating discharge: 45 minutes  SIGNED:  Mercy Riding, MD  [facility] 06/20/2018, 8:51 AM Pager [phone]  If 7PM-7AM, please contact night-coverage [email or web address] Password [email or web address]

## 2018-06-19 NOTE — Evaluation (Signed)
Physical Therapy Evaluation & Discharge  Patient Details Name: Margaret Collier MRN: [id number] DOB: January 21, 1932 Today's Date: 06/19/2018   History of Present Illness  Pt is an 85 y.o. admitted 06/11/18 with worsening confusion and speech problem. Head CT without acute abnormality; previous area of stroke. CT scan of L femur with L medial thigh abscess; this started draining spontaneously. Pt also with bradycardia. PMH includes dementia, afib, CVA (residual L-side hemiplegia), chronic respiratory failure (2L O2 baseline).    Clinical Impression  Patient evaluated by Physical Therapy with no further acute PT needs identified. PTA, pt dependent for mobility with hoyer lift and assist for ADLs. Today, pt with confusion and residual L-side hemiplegia; difficult to reason with pt on importance of mobilization, especially L-side ROM, as she is very distracted by her pain and difficult to redirect. Pt at baseline level of functioning; recommend OOB mobility with maxisky lift if able to tolerate. All education has been completed and the patient has no further questions. Acute PT is signing off. Thank you for this referral.    Follow Up Recommendations (return to SNF/ALF)    Equipment Recommendations  None recommended by PT    Recommendations for Other Services       Precautions / Restrictions Precautions Precautions: Fall Precaution Comments: L medial thigh abscess; pt sensitive to be touched anywhere, especially L-side      Mobility  Bed Mobility Overal bed mobility: Needs Assistance Bed Mobility: Rolling Rolling: Max assist;+2 for physical assistance         General bed mobility comments: MaxA+2 for rolling and sliding up in bed; pt c/o significant pain when laying flat, also unable to tolerate chair position with HOB elevated too high. Pt with R-lateral lean; pillow positioned to encourage midline position and also under BLEs to float heels  Transfers                 General  transfer comment: Baseline hoyer lift for OOB transfers; maxisky lift pad ordered for use with nursing staff, but doubt pt will be able to tolerate this  Ambulation/Gait                Stairs            Wheelchair Mobility    Modified Rankin (Stroke Patients Only)       Balance                                             Pertinent Vitals/Pain Pain Assessment: Faces Faces Pain Scale: Hurts even more Pain Location: When touched anywhere on L-side, especially LLE and L hand Pain Descriptors / Indicators: Grimacing;Guarding;Moaning Pain Intervention(s): Limited activity within patient's tolerance;Repositioned    Home Living Family/patient expects to be discharged to:: Skilled nursing facility(Pt reports has been at [facility] for 7 months (?))                      Prior Function           Comments: H/o L hemiplegia, unable to actively move L side. Dependent for all mobility with hoyer lift; assist for ADLs     Hand Dominance        Extremity/Trunk Assessment   Upper Extremity Assessment Upper Extremity Assessment: LUE deficits/detail;RUE deficits/detail;Generalized weakness RUE Deficits / Details: Functionally at least 3/5 throughout LUE Deficits / Details: Pt unwilling to let  PT touch L arm; noted finger/wrist/elbow flexion and shoulder IR/ADD contractures; encouraged self-mobilization of fingers using R hand but pt ultimately unwilling to try due to pain LUE: Unable to fully assess due to pain    Lower Extremity Assessment Lower Extremity Assessment: RLE deficits/detail;LLE deficits/detail;Generalized weakness RLE Deficits / Details: Ankle and knee observed grossly 3/5  RLE: Unable to fully assess due to pain LLE Deficits / Details: No active movement observed in LLE, PF contracture with heel cord tightness; pt reports unable to move LLE but feels pain LLE: Unable to fully assess due to pain       Communication       Cognition Arousal/Alertness: Awake/alert Behavior During Therapy: WFL for tasks assessed/performed Overall Cognitive Status: No family/caregiver present to determine baseline cognitive functioning Area of Impairment: Attention;Memory                   Current Attention Level: Sustained Memory: Decreased short-term memory         General Comments: Very distracted complaining about pain and discomfort throughout session, difficult to redirect from this or reason through importance of movement/mobilization. Pt stating she wanted to walk to bathroom but no one will help her, then saying she needs lift to get out of bed; poor historian      General Comments      Exercises     Assessment/Plan    PT Assessment All further PT needs can be met in the next venue of care  PT Problem List Decreased strength;Decreased range of motion;Decreased activity tolerance;Decreased balance;Decreased mobility;Decreased cognition;Decreased knowledge of use of DME;Decreased safety awareness;Cardiopulmonary status limiting activity;Pain;Impaired sensation;Decreased skin integrity;Obesity;Impaired tone       PT Treatment Interventions      PT Goals (Current goals can be found in the Care Plan section)  Acute Rehab PT Goals PT Goal Formulation: All assessment and education complete, DC therapy    Frequency     Barriers to discharge        Co-evaluation               AM-PAC PT "6 Clicks" Mobility  Outcome Measure Help needed turning from your back to your side while in a flat bed without using bedrails?: Total Help needed moving from lying on your back to sitting on the side of a flat bed without using bedrails?: Total Help needed moving to and from a bed to a chair (including a wheelchair)?: Total Help needed standing up from a chair using your arms (e.g., wheelchair or bedside chair)?: Total Help needed to walk in hospital room?: Total Help needed climbing 3-5 steps with a  railing? : Total 6 Click Score: 6    End of Session   Activity Tolerance: Patient limited by pain Patient left: in bed;with call bell/phone within reach;Other (comment)(attempted chair position but pt unable to tolerate trunk upright; pillows to encourage midline alignment and float heels) Nurse Communication: Mobility status;Need for lift equipment PT Visit Diagnosis: Other abnormalities of gait and mobility (R26.89);Muscle weakness (generalized) (M62.81);Hemiplegia and hemiparesis Hemiplegia - Right/Left: Left Hemiplegia - caused by: Cerebral infarction    Time: 7829-5621 PT Time Calculation (min) (ACUTE ONLY): 22 min   Charges:   PT Evaluation $PT Eval Moderate Complexity: 1 Mod     Mabeline Caras, PT, DPT Acute Rehabilitation Services  Pager [phone] Office [place] 06/19/2018, 10:06 AM

## 2018-06-19 NOTE — Progress Notes (Signed)
PROGRESS NOTE  Margaret Collier ZOX:[id number] DOB: August 19, 1931 DOA: 06/11/2018 PCP: System, Pcp Not In   LOS: 7 days   Patient is from: Facility  Brief Narrative / Interim history: 85 year old female with history of hypertension, hyperlipidemia, chronic A. fib on Eliquis, chronic respiratory failure on 2 L by nasal cannula, CVA with residual left hemiparesis, OSA and dementia who was admitted with worsening confusion and speech problem.  At baseline, bedbound.  Facility uses [employer] lift to get her up in the chair.  CT head without acute finding but previous area of stroke.  CT scan of left femur showed 3.5 cm abscess in left medial thigh.  She was also noted to be bradycardic with heart rate in the 40s. Patient was deemed to have no capacity by psychiatry.  Started on Ancef and vancomycin.  Initially the plan was I&D by general surgery, but since the abscess started draining spontaneously, I&D was on hold.  Blood cultures negative.  Transition to p.o. doxycycline  In regards to her bradycardia, she is followed by cardiology.  She is not on nodal blocking agents.  Cardiology (EP) consulted and signed off.  Subjective: No major events overnight of this morning.  Continues to complain pain in her legs and arms which appears to be chronic for her.  She is also not reliable historian.  She denies chest pain or dyspnea.  Assessment & Plan: Left thigh abscess/cellulitis -Blood cultures grew coag negative staph in 1 out of 4 bottles likely contaminant. -Repeat blood culture on 5/8 without any growth. -No I&D for now per surgery's the abscess is spontaneously draining. -On Ancef and vancomycin 5/6-5/13 -Narrowed to doxycycline 5/13-5/16  Acute metabolic encephalopathy in patient with underlying dementia: reportedly back to baseline per patient's daughter.  -CT head without acute finding. -Deemed to lack capacity per psychiatry  Bradycardia. fib: presumably asymptomatic.  Not on nodal blocking  agents. -Cardiology signed off.  Permanent A. Fib: Not on rate or rhythm control.  On Eliquis at home. -On Lovenox for anticoagulation  Chronic thrombocytopenia: Stable  AKI: Resolved.  Baseline 0.8.  Thought to be prerenal. -We will resume home diuretics at low-dose. 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  Chronic diastolic CHF-appears euvolemic.  On high-dose Lasix at home. -Home diuretics on hold due to AKI -Fluid restriction to 1500 cc -We will resume low-dose Lasix -Daily weight, intake output and renal function.  History of other nonhemorrhagic CVA with residual left hemiparesis -CT head here negative for acute finding.  Chronic pain -On oxycodone, Lyrica and Voltaren gel  Goal of care:  -Appreciate palliative care input  Morbid obesity: BMI 36.77 -Appreciate nutrition input  Stage I pressure skin injury over back and buttocks: present on admission -Air mattress to offload pressure -Frequent turning  Scheduled Meds: . atorvastatin  40 mg Oral q1800  . calcium carbonate  1 tablet Oral TID AC  . chlorhexidine  15 mL Mouth/Throat BID  . diclofenac sodium  2 g Topical QID  . doxycycline  100 mg Oral Q12H  . enoxaparin (LOVENOX) injection  85 mg Subcutaneous BID  . feeding supplement (ENSURE ENLIVE)  237 mL Oral TID BM  . multivitamin with minerals  1 tablet Oral Daily  . pantoprazole  40 mg Oral Q0600  . pregabalin  25 mg Oral Q8H  . sodium chloride flush  3 mL Intravenous Q12H  . sodium chloride flush  3 mL Intravenous Q12H   Continuous  Infusions: . sodium chloride 250 mL (06/14/18 0402)  . sodium chloride Stopped (06/18/18 0745)  . dextrose 50 mL/hr at 06/19/18 0648   PRN Meds:.sodium chloride, acetaminophen **OR** acetaminophen, albuterol, atropine, ondansetron **OR** ondansetron (ZOFRAN) IV, oxyCODONE, phenol, sodium chloride flush  DVT prophylaxis: On Lovenox for  anticoagulation Code Status: Full code Family Communication: Updated patient's daughter over the phone. Disposition Plan: Medically ready for discharge to SNF when bed available.  Consultants:   General surgery  Cardiology  Procedures:   None  Microbiology: . Blood culture 5/6 with coag negative staph in 1 out of 4 . MRSA PCR positive . Repeat blood culture on 5/8- negative so far  Antimicrobials: Anti-infectives (From admission, onward)   Start     Dose/Rate Route Frequency Ordered Stop   06/18/18 2200  doxycycline (VIBRA-TABS) tablet 100 mg     100 mg Oral Every 12 hours 06/18/18 1753     06/18/18 0800  gentamicin (GARAMYCIN) 80 mg in sodium chloride 0.9 % 500 mL irrigation  Status:  Discontinued     80 mg Irrigation To Short Stay 06/18/18 0744 06/18/18 1253   06/18/18 0800  ceFAZolin (ANCEF) IVPB 2g/100 mL premix  Status:  Discontinued     2 g 200 mL/hr over 30 Minutes Intravenous To Short Stay 06/18/18 0744 06/18/18 1250   06/17/18 1400  vancomycin (VANCOCIN) IVPB 750 mg/150 ml premix  Status:  Discontinued     750 mg 150 mL/hr over 60 Minutes Intravenous Every 24 hours 06/17/18 1311 06/18/18 1753   06/13/18 1200  vancomycin (VANCOCIN) 1,500 mg in sodium chloride 0.9 % 500 mL IVPB  Status:  Discontinued     1,500 mg 250 mL/hr over 120 Minutes Intravenous Every 48 hours 06/11/18 1233 06/17/18 1311   06/11/18 2200  ceFAZolin (ANCEF) IVPB 1 g/50 mL premix  Status:  Discontinued     1 g 100 mL/hr over 30 Minutes Intravenous Every 8 hours 06/11/18 1335 06/18/18 1753   06/11/18 1300  vancomycin (VANCOCIN) 1,750 mg in sodium chloride 0.9 % 500 mL IVPB     1,750 mg 250 mL/hr over 120 Minutes Intravenous  Once 06/11/18 1233 06/11/18 1512   06/11/18 1215  ceFAZolin (ANCEF) IVPB 1 g/50 mL premix     1 g 100 mL/hr over 30 Minutes Intravenous  Once 06/11/18 1212 06/11/18 1300   06/11/18 1215  vancomycin (VANCOCIN) IVPB 1000 mg/200 mL premix  Status:  Discontinued     1,000 mg  200 mL/hr over 60 Minutes Intravenous  Once 06/11/18 1212 06/11/18 1233      Objective: Vitals:   06/18/18 1203 06/18/18 1948 06/19/18 0209 06/19/18 0420  BP: (!) 142/59 (!) 132/57  (!) 134/53  Pulse: (!) 48 (!) 44  (!) 44  Resp: 20 18  18   Temp: 98 F (36.7 C) 97.6 F (36.4 C)  (!) 97.5 F (36.4 C)  TempSrc: Oral Oral  Oral  SpO2: 97% 99%  94%  Weight:   85.6 kg   Height:        Intake/Output Summary (Last 24 hours) at 06/19/2018 0657 Last data filed at 06/19/2018 0648 Gross per 24 hour  Intake 876.97 ml  Output 2900 ml  Net -2023.03 ml   Filed Weights   06/17/18 0227 06/18/18 0431 06/19/18 0209  Weight: 87 kg 85.4 kg 85.6 kg    Examination:  GENERAL: No acute distress.  Appears well.  HEENT: MMM.  Vision and hearing grossly intact.  NECK: Supple.  No apparent JVD LUNGS:  No IWOB.  Fair air movement bilaterally HEART: Irregular rhythm.  Bradycardic to 40s.  S1 and S2 audible. ABD: Bowel sounds present. Soft. Non tender.  MSK/EXT: Left hemiparesis.  Moves right upper and lower extremities.  SKIN: Erythema with central opening over medial aspect of left eye proximally.  See picture below for more NEURO: Awake, alert and oriented to self and place only.  Left hemiparesis. PSYCH: Calm. Normal affect.     Data Reviewed: I have independently reviewed following labs and imaging studies  CBC: Recent Labs  Lab 06/14/18 0217 06/15/18 0444 06/16/18 0716 06/17/18 0843 06/18/18 0507  WBC 6.4 8.8 9.4 8.6 9.6  HGB 12.5 12.0 16.0* 12.4 11.4*  HCT 39.5 38.5 48.9* 38.4 35.7*  MCV 81.6 81.7 80.7 81.4 80.8  PLT 149* 146* PLATELET CLUMPS NOTED ON SMEAR, UNABLE TO ESTIMATE 134* 146*   Basic Metabolic Panel: Recent Labs  Lab 06/13/18 0717 06/14/18 0217 06/14/18 0223 06/15/18 0443 06/16/18 0716 06/17/18 0843  NA 145 146*  --  140 144 141  K 4.0 3.6  --  3.8 3.4* 4.0  CL 107 110  --  106 110 110  CO2 22 24  --  GLUCOSE 81 76  --  121* 124* 126*  BUN 27*  19  --  CREATININE 0.93 0.76  --  0.70 0.60 0.73  CALCIUM 9.2 8.8*  --  8.6* 8.6* 8.5*  MG  --   --  2.0  --   --   --    GFR: Estimated Creatinine Clearance: 50.8 mL/min (by C-G formula based on SCr of 0.73 mg/dL). Liver Function Tests: No results for input(s): AST, ALT, ALKPHOS, BILITOT, PROT, ALBUMIN in the last 168 hours. No results for input(s): LIPASE, AMYLASE in the last 168 hours. No results for input(s): AMMONIA in the last 168 hours. Coagulation Profile: Recent Labs  Lab 06/15/18 0443 06/16/18 0716  INR 1.3* 1.3*   Cardiac Enzymes: No results for input(s): CKTOTAL, CKMB, CKMBINDEX, TROPONINI in the last 168 hours. BNP (last 3 results) No results for input(s): PROBNP in the last 8760 hours. HbA1C: No results for input(s): HGBA1C in the last 72 hours. CBG: Recent Labs  Lab 06/14/18 0541 06/14/18 1014 06/15/18 0839  GLUCAP 88 89 136*   Lipid Profile: No results for input(s): CHOL, HDL, LDLCALC, TRIG, CHOLHDL, LDLDIRECT in the last 72 hours. Thyroid Function Tests: No results for input(s): TSH, T4TOTAL, FREET4, T3FREE, THYROIDAB in the last 72 hours. Anemia Panel: No results for input(s): VITAMINB12, FOLATE, FERRITIN, TIBC, IRON, RETICCTPCT in the last 72 hours. Urine analysis:    Component Value Date/Time   COLORURINE YELLOW 06/11/2018 0918   APPEARANCEUR CLEAR 06/11/2018 0918   LABSPEC 1.010 06/11/2018 0918   PHURINE 7.0 06/11/2018 0918   GLUCOSEU NEGATIVE 06/11/2018 0918   HGBUR NEGATIVE 06/11/2018 0918   BILIRUBINUR NEGATIVE 06/11/2018 0918   KETONESUR NEGATIVE 06/11/2018 0918   PROTEINUR NEGATIVE 06/11/2018 0918   NITRITE NEGATIVE 06/11/2018 0918   LEUKOCYTESUR NEGATIVE 06/11/2018 0918   Sepsis Labs: Invalid input(s): PROCALCITONIN, LACTICIDVEN  Recent Results (from the past 240 hour(s))  Culture, blood (routine x 2)     Status: Abnormal   Collection Time: 06/11/18  9:18 AM  Result Value Ref Range Status   Specimen Description BLOOD RIGHT  ANTECUBITAL  Final   Special Requests   Final    BOTTLES DRAWN AEROBIC AND ANAEROBIC Blood Culture adequate volume   Culture  Setup Time  Final    GRAM POSITIVE COCCI ANAEROBIC BOTTLE ONLY CRITICAL RESULT CALLED TO, READ BACK BY AND VERIFIED WITH: R. FANNING PHARMD, AT 1334 06/12/18 BY D. VANHOOK    Culture (A)  Final    STAPHYLOCOCCUS SPECIES (COAGULASE NEGATIVE) THE SIGNIFICANCE OF ISOLATING THIS ORGANISM FROM A SINGLE SET OF BLOOD CULTURES WHEN MULTIPLE SETS ARE DRAWN IS UNCERTAIN. PLEASE NOTIFY THE MICROBIOLOGY DEPARTMENT WITHIN ONE WEEK IF SPECIATION AND SENSITIVITIES ARE REQUIRED. Performed at [facility], [address] [place], [place] [address]    Report Status 06/14/2018 FINAL  Final  Culture, blood (routine x 2)     Status: None   Collection Time: 06/11/18  9:18 AM  Result Value Ref Range Status   Specimen Description BLOOD BLOOD RIGHT FOREARM  Final   Special Requests   Final    BOTTLES DRAWN AEROBIC AND ANAEROBIC Blood Culture adequate volume   Culture   Final    NO GROWTH 5 DAYS Performed at [facility], [address] [place], [place] [address]    Report Status 06/16/2018 FINAL  Final  SARS Coronavirus 2 (CEPHEID - Performed in [facility] hospital lab), Hosp Order     Status: None   Collection Time: 06/11/18  9:18 AM  Result Value Ref Range Status   SARS Coronavirus 2 NEGATIVE NEGATIVE Final    Comment: (NOTE) If result is NEGATIVE SARS-CoV-2 target nucleic acids are NOT DETECTED. The SARS-CoV-2 RNA is generally detectable in upper and lower  respiratory specimens during the acute phase of infection. The lowest  concentration of SARS-CoV-2 viral copies this assay can detect is 250  copies / mL. A negative result does not preclude SARS-CoV-2 infection  and should not be used as the sole basis for treatment or other  patient management decisions.  A negative result may occur with  improper specimen collection / handling, submission of specimen other   than nasopharyngeal swab, presence of viral mutation(s) within the  areas targeted by this assay, and inadequate number of viral copies  (<250 copies / mL). A negative result must be combined with clinical  observations, patient history, and epidemiological information. If result is POSITIVE SARS-CoV-2 target nucleic acids are DETECTED. The SARS-CoV-2 RNA is generally detectable in upper and lower  respiratory specimens dur ing the acute phase of infection.  Positive  results are indicative of active infection with SARS-CoV-2.  Clinical  correlation with patient history and other diagnostic information is  necessary to determine patient infection status.  Positive results do  not rule out bacterial infection or co-infection with other viruses. If result is PRESUMPTIVE POSTIVE SARS-CoV-2 nucleic acids MAY BE PRESENT.   A presumptive positive result was obtained on the submitted specimen  and confirmed on repeat testing.  While 2019 novel coronavirus  (SARS-CoV-2) nucleic acids may be present in the submitted sample  additional confirmatory testing may be necessary for epidemiological  and / or clinical management purposes  to differentiate between  SARS-CoV-2 and other Sarbecovirus currently known to infect humans.  If clinically indicated additional testing with an alternate test  methodology [phone]) is advised. The SARS-CoV-2 RNA is generally  detectable in upper and lower respiratory sp ecimens during the acute  phase of infection. The expected result is Negative. Fact Sheet for Patients:  BoilerBrush.com.cyhttps://www.fda.gov/media/136312/download Fact Sheet for Healthcare Providers: https://pope.com/https://www.fda.gov/media/136313/download This test is not yet approved or cleared by the [place] FDA and has been authorized for detection and/or diagnosis of SARS-CoV-2 by FDA under an  Emergency Use Authorization (EUA).  This EUA will remain in effect (meaning this test can be used) for the duration of the  COVID-19 declaration under Section 564(b)(1) of the Act, 21 U.S.C. section 360bbb-3(b)(1), unless the authorization is terminated or revoked sooner. Performed at [facility], [address], [place], [place] [address]   Blood Culture ID Panel (Reflexed)     Status: Abnormal   Collection Time: 06/11/18  9:18 AM  Result Value Ref Range Status   Enterococcus species NOT DETECTED NOT DETECTED Final   Listeria monocytogenes NOT DETECTED NOT DETECTED Final   Staphylococcus species DETECTED (A) NOT DETECTED Final    Comment: Methicillin (oxacillin) susceptible coagulase negative staphylococcus. Possible blood culture contaminant (unless isolated from more than one blood culture draw or clinical case suggests pathogenicity). No antibiotic treatment is indicated for blood  culture contaminants. CRITICAL RESULT CALLED TO, READ BACK BY AND VERIFIED WITH: R. FANNING PHARMD, AT 1334 06/12/18 BY D. VANHOOK    Staphylococcus aureus (BCID) NOT DETECTED NOT DETECTED Final   Methicillin resistance NOT DETECTED NOT DETECTED Final   Streptococcus species NOT DETECTED NOT DETECTED Final   Streptococcus agalactiae NOT DETECTED NOT DETECTED Final   Streptococcus pneumoniae NOT DETECTED NOT DETECTED Final   Streptococcus pyogenes NOT DETECTED NOT DETECTED Final   Acinetobacter baumannii NOT DETECTED NOT DETECTED Final   Enterobacteriaceae species NOT DETECTED NOT DETECTED Final   Enterobacter cloacae complex NOT DETECTED NOT DETECTED Final   Escherichia coli NOT DETECTED NOT DETECTED Final   Klebsiella oxytoca NOT DETECTED NOT DETECTED Final   Klebsiella pneumoniae NOT DETECTED NOT DETECTED Final   Proteus species NOT DETECTED NOT DETECTED Final   Serratia marcescens NOT DETECTED NOT DETECTED Final   Haemophilus influenzae NOT DETECTED NOT DETECTED Final   Neisseria meningitidis NOT DETECTED NOT DETECTED Final   Pseudomonas aeruginosa NOT DETECTED NOT DETECTED Final   Candida albicans NOT DETECTED  NOT DETECTED Final   Candida glabrata NOT DETECTED NOT DETECTED Final   Candida krusei NOT DETECTED NOT DETECTED Final   Candida parapsilosis NOT DETECTED NOT DETECTED Final   Candida tropicalis NOT DETECTED NOT DETECTED Final    Comment: Performed at [facility], [address], [place], [place] [address]  MRSA PCR Screening     Status: Abnormal   Collection Time: 06/11/18 11:55 PM  Result Value Ref Range Status   MRSA by PCR POSITIVE (A) NEGATIVE Final    Comment:        The GeneXpert MRSA Assay (FDA approved for NASAL specimens only), is one component of a comprehensive MRSA colonization surveillance program. It is not intended to diagnose MRSA infection nor to guide or monitor treatment for MRSA infections. RESULT CALLED TO, READ BACK BY AND VERIFIED WITH: L. Sheliah Plane [phone] 06/12/2018 Girtha Hake Performed at [facility], [address] [place], [place] [address]   Culture, blood (routine x 2)     Status: None   Collection Time: 06/13/18  2:56 PM  Result Value Ref Range Status   Specimen Description BLOOD RIGHT HAND  Final   Special Requests   Final    BOTTLES DRAWN AEROBIC AND ANAEROBIC Blood Culture adequate volume   Culture   Final    NO GROWTH 5 DAYS Performed at [facility], [address], [place], [place] [address]    Report Status 06/18/2018 FINAL  Final  Culture, blood (routine x 2)     Status: None   Collection Time:  06/13/18  3:06 PM  Result Value Ref Range Status   Specimen Description BLOOD LEFT HAND  Final   Special Requests   Final    BOTTLES DRAWN AEROBIC AND ANAEROBIC Blood Culture adequate volume   Culture   Final    NO GROWTH 5 DAYS Performed at [facility], [address] [place], [place] [address]    Report Status 06/18/2018 FINAL  Final  Surgical pcr screen     Status: Abnormal   Collection Time: 06/14/18  1:25 AM  Result Value Ref Range Status   MRSA, PCR POSITIVE (A) NEGATIVE Final    Comment: CRITICAL RESULT  CALLED TO, READ BACK BY AND VERIFIED WITH: RN Lucrezia Europe [phone]  THANEY     Staphylococcus aureus POSITIVE (A) NEGATIVE Final    Comment: CRITICAL RESULT CALLED TO, READ BACK BY AND VERIFIED WITH: RN Lucrezia Europe [id number]  THANEY Performed at [facility], [address] [place], [place] [address]     Radiology Studies: No results found.  Pattrick Bady T. [facility] Pager [phone]  If 7PM-7AM, please contact night-coverage [email or web address] Password [facility] 06/19/2018, 6:57 AM

## 2018-06-19 NOTE — Progress Notes (Addendum)
Telemetry reviewed with Dr. Elberta Fortis, AFib 30's-50's, rare pause 4seconds BP stable  Patient is bed-bound with no appreciable symptoms associated/attributable to bradycardia No pacer implant unless patient develops clear symptoms of bradycardia   Avoid potential AV nodal/rate slowing drugs. No particular EP follow up needed.  EP Ruari Mudgett sign off though remain available, please recall if needed.  Francis Dowse, PA-C  Agree with above. Patient bradycardic without symptoms. Continue to monitor.  Loman Brooklyn, MD

## 2018-06-20 LAB — BASIC METABOLIC PANEL WITH GFR
Anion gap: 9 (ref 5–15)
BUN: 10 mg/dL (ref 8–23)
CO2: 24 mmol/L (ref 22–32)
Calcium: 8.3 mg/dL — ABNORMAL LOW (ref 8.9–10.3)
Chloride: 107 mmol/L (ref 98–111)
Creatinine, Ser: 0.71 mg/dL (ref 0.44–1.00)
GFR calc Af Amer: >60 mL/min (ref >60)
GFR calc non Af Amer: >60 mL/min (ref >60)
Glucose, Bld: 81 mg/dL (ref 70–99)
Potassium: 4 mmol/L (ref 3.5–5.1)
Sodium: 140 mmol/L (ref 135–145)

## 2018-06-20 LAB — MAGNESIUM: Magnesium: 1.9 mg/dL (ref 1.7–2.4)

## 2018-06-20 MED ORDER — DOXYCYCLINE HYCLATE 100 MG PO TABS: 100.0000 mg | ORAL_TABLET | Freq: Two times a day (BID) | ORAL | 0 refills | Status: AC

## 2018-06-20 MED ORDER — ENSURE ENLIVE PO LIQD: 237.0000 mL | Freq: Three times a day (TID) | ORAL | 0 refills | Status: AC

## 2018-06-20 MED ORDER — ACETAMINOPHEN 500 MG PO TABS: 1000.0000 mg | ORAL_TABLET | Freq: Three times a day (TID) | ORAL | 2 refills | Status: AC

## 2018-06-20 MED ORDER — OXYCODONE HCL 5 MG PO TABS: 5.0000 mg | ORAL_TABLET | Freq: Four times a day (QID) | ORAL | 0 refills | Status: AC | PRN

## 2018-06-20 NOTE — Progress Notes (Signed)
Pt had a 3.09 sec pause, no s/s, MD notified, will continue to monitor, Thanks Lavonda Jumbo RN.

## 2018-06-20 NOTE — Progress Notes (Signed)
Palliative Medicine RN Note: Facilitated Skype call between niece Judeth Cornfield and patient.  Margret Chance Leba Tibbitts, RN, BSN, [facility] Palliative Medicine Team 06/20/2018 10:56 AM Office [phone]

## 2018-06-20 NOTE — Clinical Social Work Note (Signed)
[facility] cannot accept patient back until COVID results are back. Spoke to Secondary school teacher. She will see if RN can expedite test.  Charlynn Court, CSW [phone]

## 2018-06-20 NOTE — Progress Notes (Signed)
Pt had a pause of 3.63 sec, no s/s, MD has been aware of these, Thanks Glenna Fellows.

## 2018-06-21 LAB — NOVEL CORONAVIRUS, NAA (HOSP ORDER, SEND-OUT TO REF LAB; TAT 18-24 HRS): SARS-CoV-2, NAA: NOT DETECTED

## 2018-06-21 NOTE — TOC Transition Note (Signed)
Transition of Care [facility]) - CM/SW Discharge Note   Patient Details  Name: Margaret Collier MRN: [id number] Date of Birth: Dec 11, 1931  [facility] CM/SW Contact:  Althea Charon, LCSW Phone Number: 06/21/2018, 2:08 PM   Clinical Narrative:   Patients going back to [facility] via [facility]. CSW notified pt's niece of discharge back to [facility]. RN to call [phone](going in room 506)  for report.    Final next level of care: Skilled Nursing Facility Barriers to Discharge: No Barriers Identified   Patient Goals and CMS Choice        Discharge Placement PASRR number recieved: 06/21/18            Patient chooses bed at: [facility] Patient to be transferred to facility by: ptar Name of family member notified: Spoke with Judeth Cornfield patient's niece Judeth Cornfield Patient and family notified of of transfer: 06/21/18  Discharge Plan and Services                                     Social Determinants of Health (SDOH) Interventions     Readmission Risk Interventions Readmission Risk Prevention Plan 06/20/2018  Transportation Screening Complete  PCP or Specialist Appt within 3-5 Days Complete  HRI or Home Care Consult Complete  Social Work Consult for Recovery Care Planning/Counseling Complete  Palliative Care Screening Complete

## 2018-06-21 NOTE — Progress Notes (Addendum)
Called [facility] - Spoke to [place] to give report.   Sent AVS with [facility] as well as prescription for medications.    Called Niece to let her know patient's test came back neg and she is being transported to [facility].

## 2018-06-21 NOTE — Progress Notes (Signed)
Patient discharged yesterday but stayed for repeat COVID-19 screening as required by accepting SNF.  Repeat COVID-19 negative.  Patient remained stable.  Plan per discharge summary from 06/20/2018.

## 2019-11-30 ENCOUNTER — Non-Acute Institutional Stay: Payer: Medicare Other | Admitting: Internal Medicine

## 2020-02-12 NOTE — Progress Notes (Signed)
[employer] Palliative Care Consult Note Telephone: [phone]  Fax: [phone]  PATIENT NAME: Margaret Collier DOB: Jul 08, 1931 MRN: [id number]  PRIMARY CARE PROVIDER:   Pcp, No  REFERRING PROVIDER:  No referring provider defined for this encounter.  RESPONSIBLE PARTY:     ASSESSMENT:        RECOMMENDATIONS and PLAN:   Palliative care encounter  Z51.5  1.  Advance care planning:  Explanation of palliative and hospice care services.  Pt's goals are to continue residence at current SNF to receive assistance with all ADLs.  She would also like to avoid hospitalizations.  Advanced directives reviewed with delegations of  DNAR/DNI, comfort care without return to hospital, antibiotics if indicated, trial IV fluids and no feeding tube.  MOST and DNAR forms were completed by patient,  placed in pt's chart and uploaded into [employer]. Palliative care will f/u with patient as needed.   2.  Cellulitis L leg:  Chronic re-occurence:  Complete antibiotics. Elevate legs and increase mobility.  Turn and reposition every 2 hours. Continue to monitor.  3.  Immobility:  Chronic as related to previous CVA.  Encouraged pt.(resistant) to get OOB with assistance of staff via [facility] lift.   I spent 60 minutes providing this consultation,  from 1400 to 1500. More than 50% of the time in this consultation was spent coordinating communication with patient and clinical staff.  Communication with niece/POA Margaret Collier update her related to patient status.   HISTORY OF PRESENT ILLNESS:  Margaret Collier is a 87 y.o. year old female with multiple medical problems including CHF, previous CVA and immobility. Staff reports current cellulitis episode of the LLE.  Palliative Care was asked to help address goals of care.   CODE STATUS: DNAR/DNI  PPS: 30%  Bedridden HOSPICE ELIGIBILITY/DIAGNOSIS: TBD  PAST MEDICAL HISTORY:  Past Medical History:  Diagnosis Date  . Atrial fibrillation ([place])    . Hyperlipidemia   . Hypertension   . OSA (obstructive sleep apnea)     SOCIAL HX: Long term resident of SNF Social History   Tobacco Use  . Smoking status: Never Smoker  . Smokeless tobacco: Never Used  Substance Use Topics  . Alcohol use: Not on file    ALLERGIES:  Allergies  Allergen Reactions  . Zanaflex [Tizanidine Hcl] Shortness Of Breath, Other (See Comments) and Cough    Dehydration - reported by [facility] 10/07/13  . Chlorthalidone Other (See Comments)    syncope  . Dilaudid [Hydromorphone Hcl] Hives and Swelling  . Fosamax [Alendronate Sodium] Nausea And Vomiting and Other (See Comments)    Back and hip pain  . Lovastatin Other (See Comments)    Weakness - reported by [facility] 05/13/14   . Statins Other (See Comments)    Myalgia, weakness - reported by [facility] 05/13/14  . Alprazolam Other (See Comments)    Dry mouth - reported by [facility] 05/13/14   . Carisoprodol Other (See Comments)    Dizziness - reported by [facility] 05/13/14   . Colesevelam Nausea And Vomiting and Other (See Comments)    GI upset and stool change - reported by [facility] 05/13/14   . Duloxetine Other (See Comments)    Dizziness - reported by [facility] 05/13/14   . Felodipine Other (See Comments)    Dizziness, groin pain - reported by [facility] 05/13/14   . Furosemide Other (See Comments)    Dry  mouth/dizziness - reported by [facility] 05/13/14   . Gabapentin Other (See Comments)    Insomnia - reported by [facility] 05/13/14   . Hydrocodone-Acetaminophen Nausea And Vomiting    reported by [facility] 05/13/14   . Ibuprofen Other (See Comments)    GI pain - reported by [facility] 05/13/14   . Meclizine Other (See Comments)    Sleeplessness - reported by [facility] 05/13/14   . Meperidine Other (See Comments)    Jitteriness - reported by [facility] 05/13/14   . Methadone Other (See Comments)    Stool impaction - reported  by [facility] 05/13/14   . Morphine Other (See Comments)    Dizziness - reported by [facility] 05/13/14   . Omeprazole Other (See Comments)    Sleeplessness - reported by [facility] 05/13/14   . Oxycodone Other (See Comments)    Groin pain - reported by [facility] 05/13/14   . Pravastatin Other (See Comments)    Weakness - reported by [facility] 05/13/14   . Simvastatin Other (See Comments)    Weakness - reported by [facility] 05/13/14   . Tramadol Anxiety and Other (See Comments)    Nervous - reported by [facility] 05/13/14   . Valsartan Other (See Comments)    Dizziness - reported by [facility] 05/13/14      PERTINENT MEDICATIONS:  Outpatient Encounter Medications as of 11/30/2019  Medication Sig  . albuterol (VENTOLIN HFA) 108 (90 Base) MCG/ACT inhaler Inhale 2 puffs into the lungs every 4 (four) hours as needed for wheezing or shortness of breath.  Marland Kitchen atorvastatin (LIPITOR) 40 MG tablet Take 40 mg by mouth daily at 6 PM.   . b complex vitamins capsule Take 1 capsule by mouth daily.  . calcium carbonate (TUMS - DOSED IN MG ELEMENTAL CALCIUM) 500 MG chewable tablet Chew 1 tablet by mouth 3 (three) times daily before meals.  . chlorhexidine (PERIDEX) 0.12 % solution Use as directed 15 mLs in the mouth or throat 2 (two) times daily.  . cholecalciferol (VITAMIN D3) 25 MCG (1000 UT) tablet Take 1,000 Units by mouth daily.  Marland Kitchen doxycycline (VIBRA-TABS) 100 MG tablet Take 1 tablet (100 mg total) by mouth every 12 (twelve) hours.  . feeding supplement, ENSURE ENLIVE, (ENSURE ENLIVE) LIQD Take 237 mLs by mouth 3 (three) times daily between meals.  . furosemide (LASIX) 40 MG tablet Take 40 mg by mouth daily.   . OXYGEN Inhale 2 L/min into the lungs See admin instructions. Apply via nasal canula daily at bedtime and remove every morning  . pantoprazole (PROTONIX) 40 MG tablet Take 40 mg by mouth daily at 6 (six) AM.   . phenol (CHLORASEPTIC) 1.4 % LIQD Use as  directed 2 sprays in the mouth or throat every 2 (two) hours as needed for throat irritation / pain. x48  . polyethylene glycol (MIRALAX / GLYCOLAX) packet Take 17 g by mouth daily as needed for moderate constipation.  . potassium chloride SA (K-DUR,KLOR-CON) 20 MEQ tablet Take 20 mEq by mouth daily.  . pregabalin (LYRICA) 25 MG capsule Take 25 mg by mouth every 8 (eight) hours.    No facility-administered encounter medications on file as of 11/30/2019.    PHYSICAL EXAM:   General: NAD, chronically ill appearing obese elderly female reclining in bed Cardiovascular: regular rate and rhythm Pulmonary: clear ant fields Abdomen: soft, nontender, +  bowel sounds Extremities:2+ edema BLE Skin: erythema and tenderness of distal LLE Neurological: Alert and oriented x3,  right hemiparesis Psych:  Calm and cooperative  Margaretha Sheffield, NP-C

## 2020-02-16 ENCOUNTER — Telehealth: Payer: Self-pay | Admitting: Internal Medicine

## 2020-02-16 NOTE — Telephone Encounter (Signed)
Late entry: 01/29/20- Received call from charge nurse at [facility] related to pt who states that patient has pulmonary rales with some shortness of breath, L leg is also erythematous as it has been with previous episodes of cellulitis.  Current lab study findings: WBC 12,000 and temperature of 99.6.  Orders given to begin Doxycycline 100mg  BID for 5 days and have facility provider follow-up with patient on the next business day. Nurse agreed with plan. , NP-C

## 2020-03-08 DEATH — deceased

## 2020-09-27 IMAGING — DX DG CHEST 2V
2 series · 2 of 2 positions shown · non-contrast
Comparison: None.

CLINICAL DATA: Shortness of breath.

EXAM:
CHEST - 2 VIEW

[chest lat]
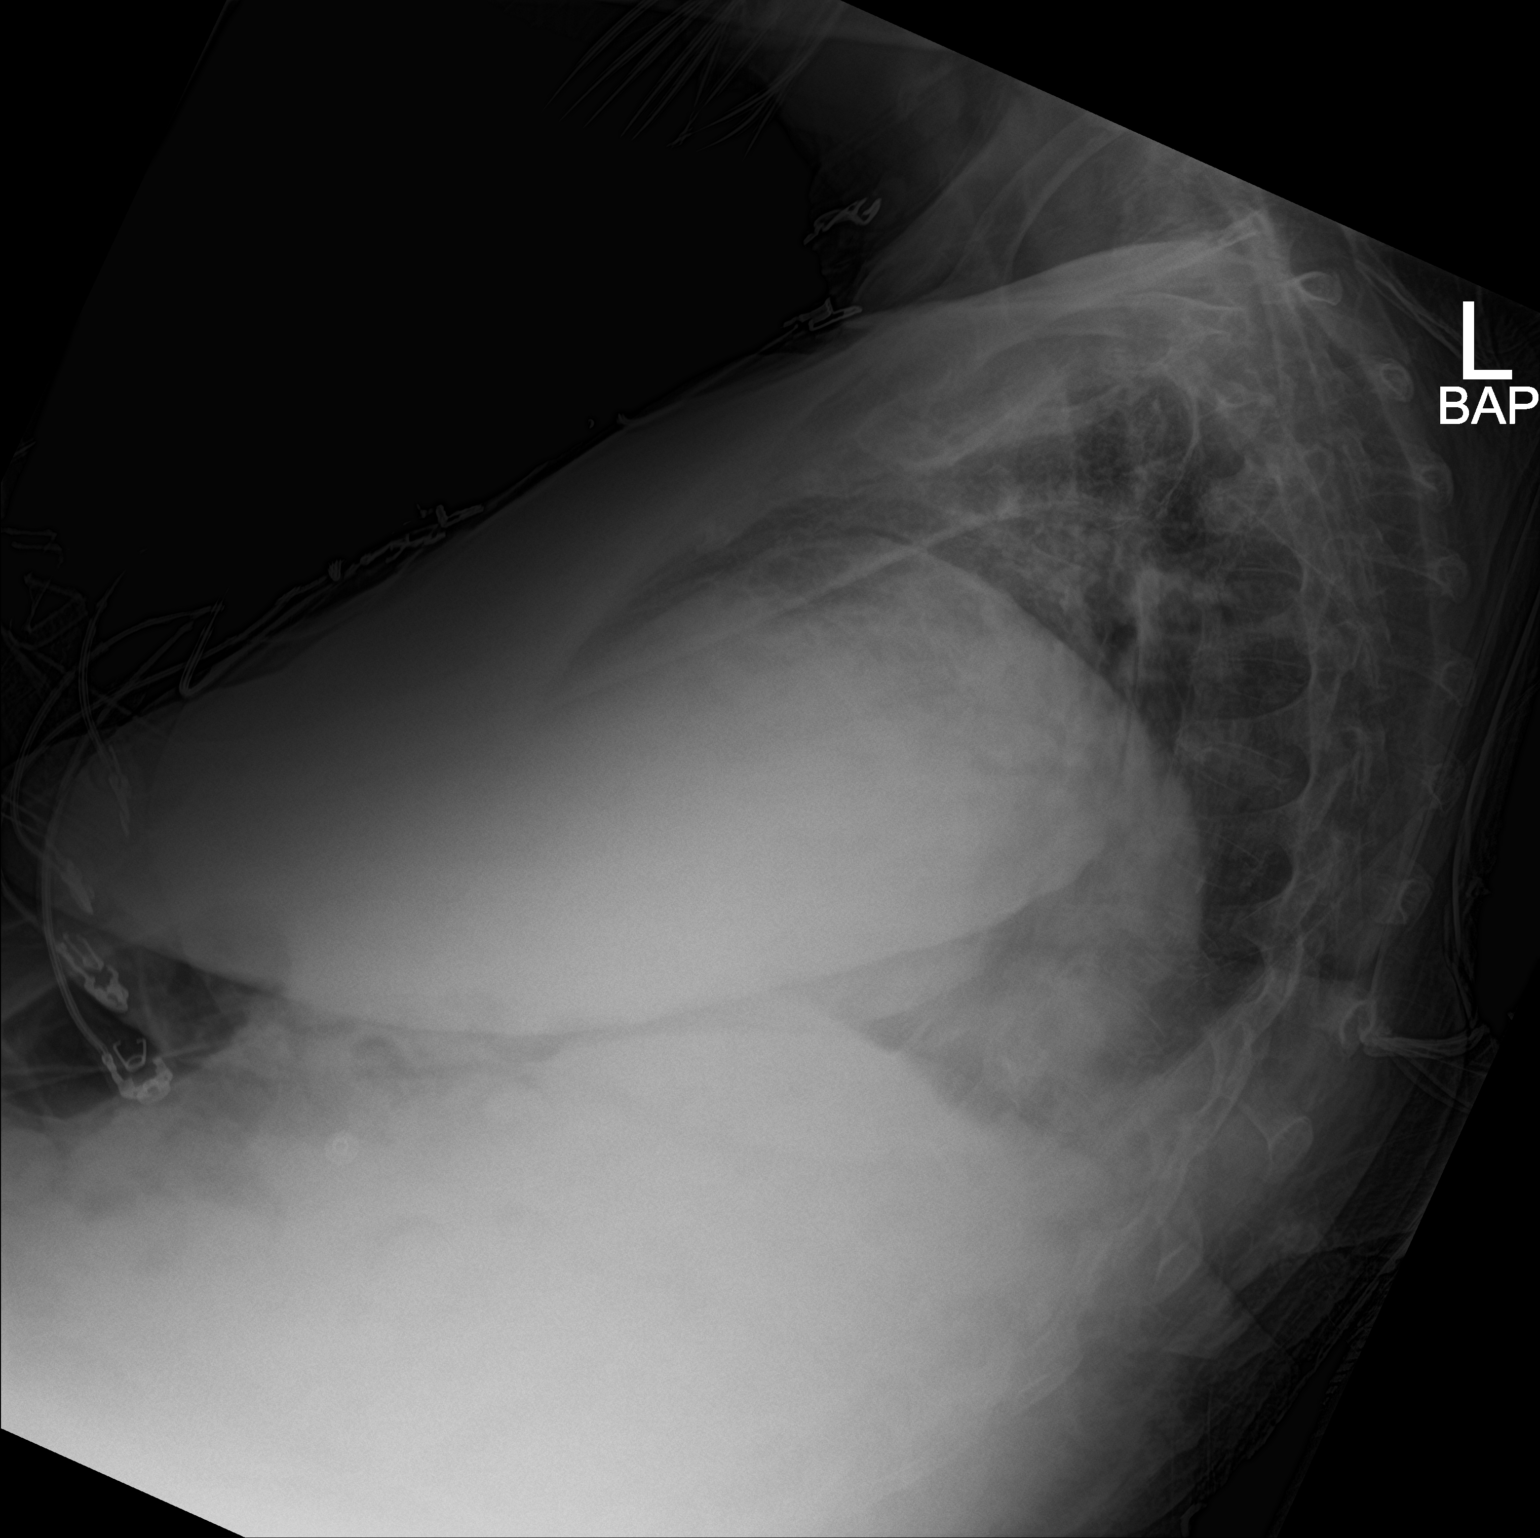

[chest ap]
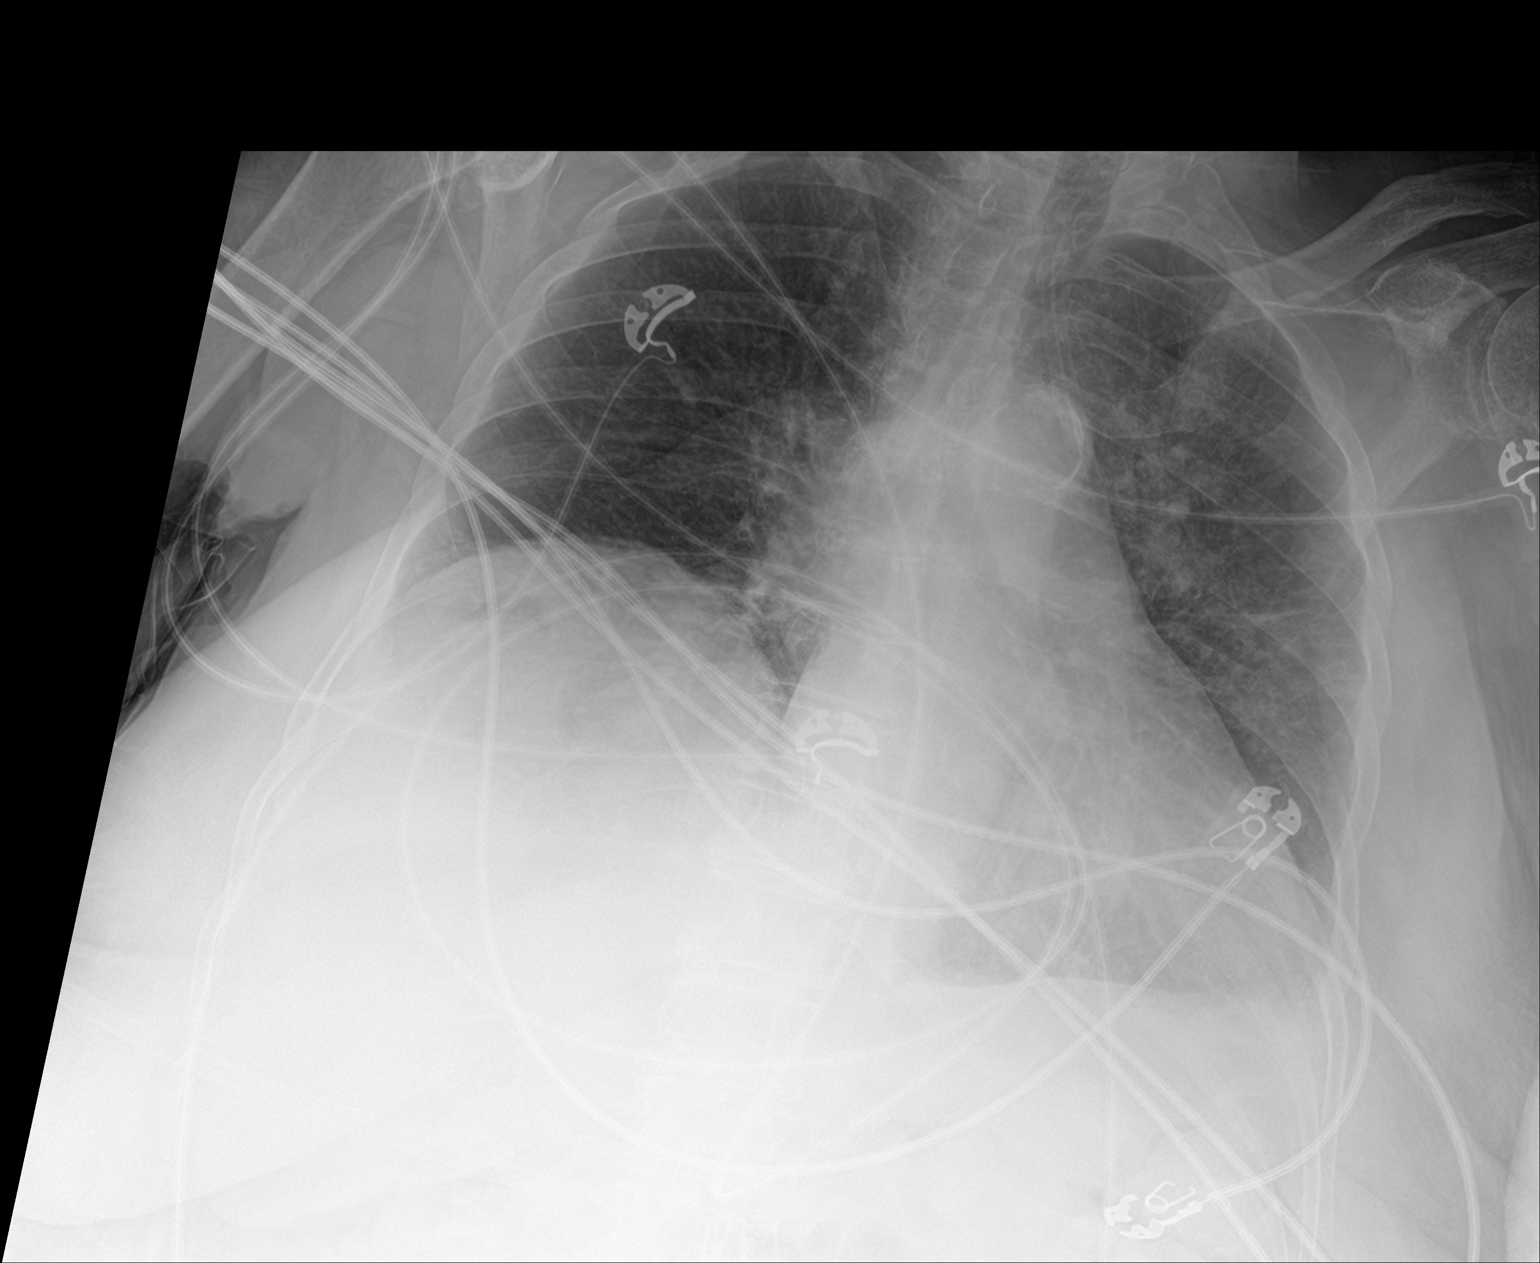

[2 of 2 positions shown; findings below may reference images not displayed]

FINDINGS: Cardiomegaly. Normal pulmonary vascularity. Atherosclerotic
calcification of the aortic arch. Possible small left pleural
effusion. No consolidation or pneumothorax. Elevation of the right
hemidiaphragm. No acute osseous abnormality.
IMPRESSION: 1. Cardiomegaly.  Possible small left pleural effusion.

## 2021-02-19 IMAGING — CT CT HEAD WITHOUT CONTRAST
4 series · 16 of 47 positions shown, 18 images · non-contrast
Comparison: 10/05/2017

CLINICAL DATA: Left-sided weakness. Left-sided facial droop.
Worsened confusion.

EXAM:
CT HEAD WITHOUT CONTRAST
TECHNIQUE: Contiguous axial images were obtained from the base of the skull
through the vertex without intravenous contrast.

[Series 3: head wo · axial · 0.40mm/px · z∈[-114,+2]mm · 7 of 31 slices shown, 9 images]
[im 4/31  brain]
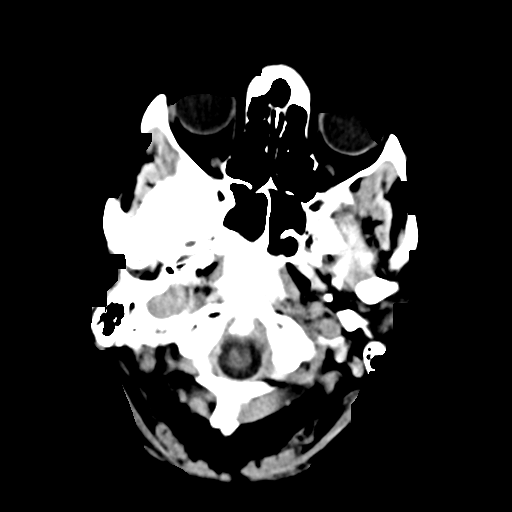
[im 4/31  bone]
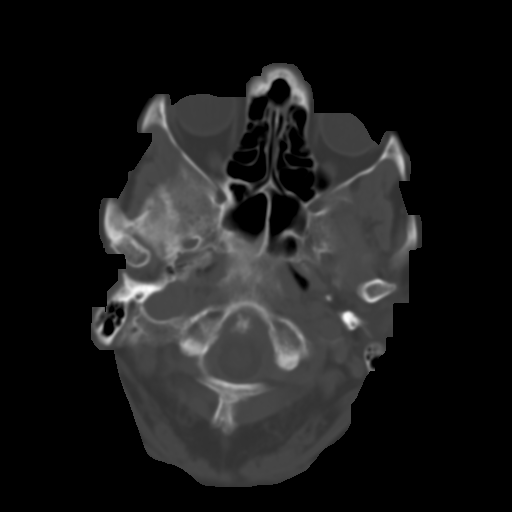
[im 8/31  brain]
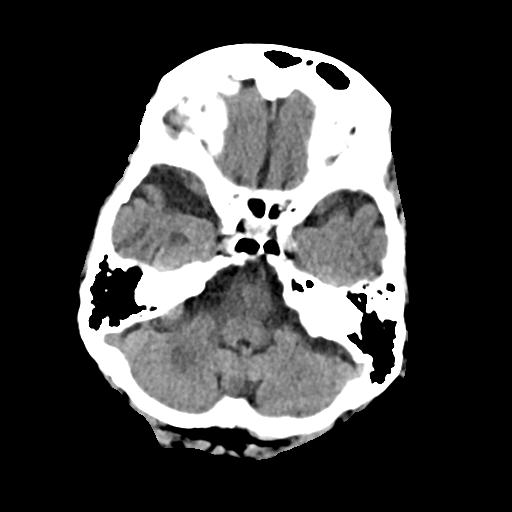
[im 12/31  brain]
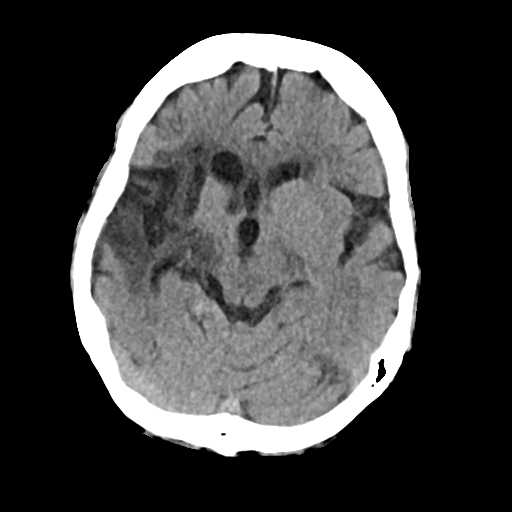
[im 16/31  brain]
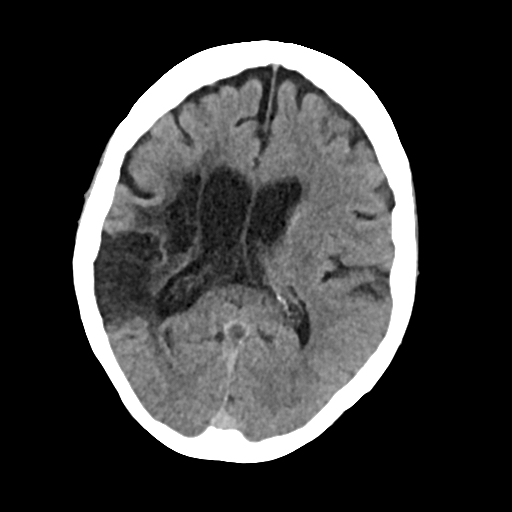
[im 19/31  brain]
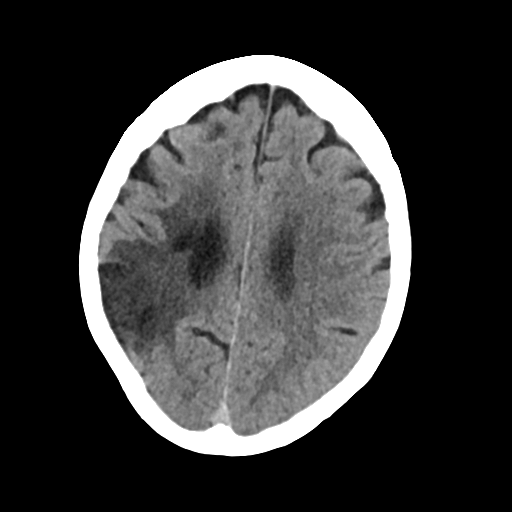
[im 19/31  bone]
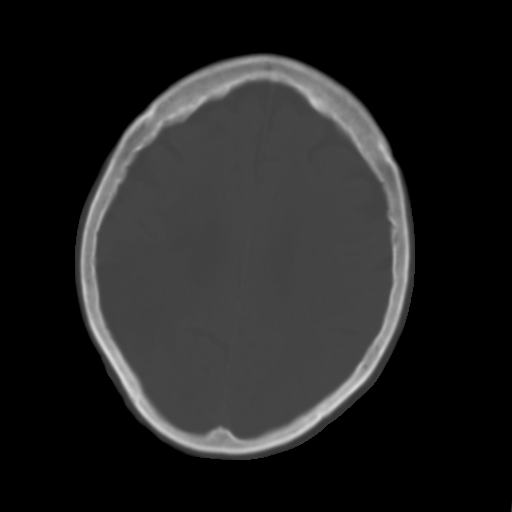
[im 23/31  brain]
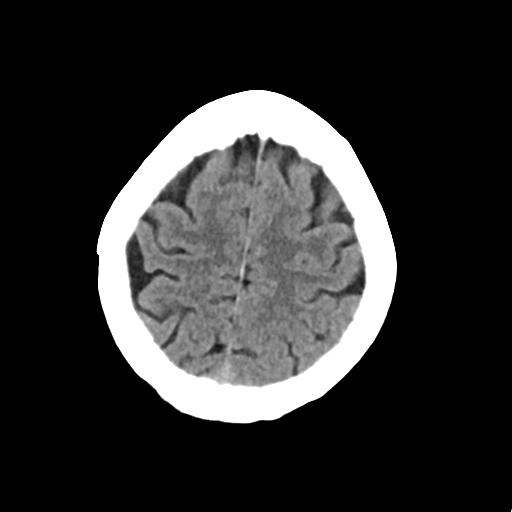
[im 27/31  brain]
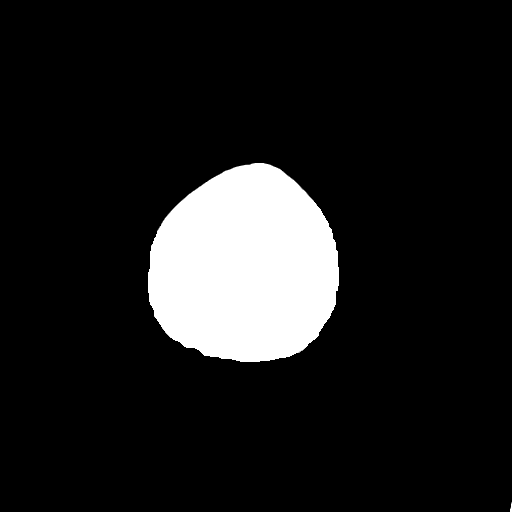

[Series 4: head bone · axial · 0.40mm/px · z∈[-114,-84]mm · 3 of 76 slices shown]
[im 8/76  bone]
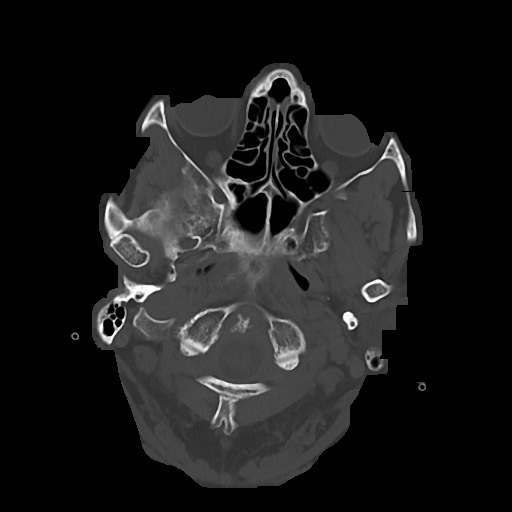
[im 16/76  bone]
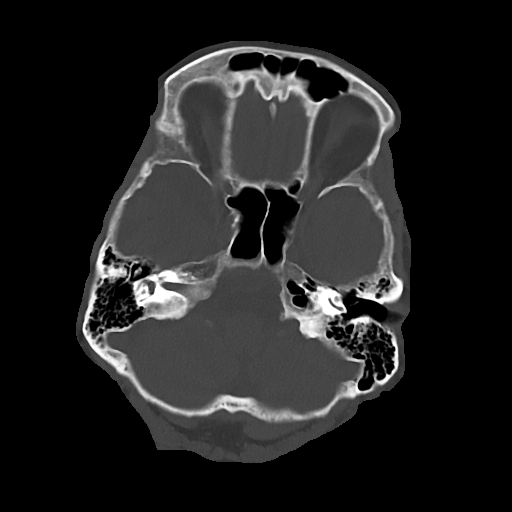
[im 23/76  bone]
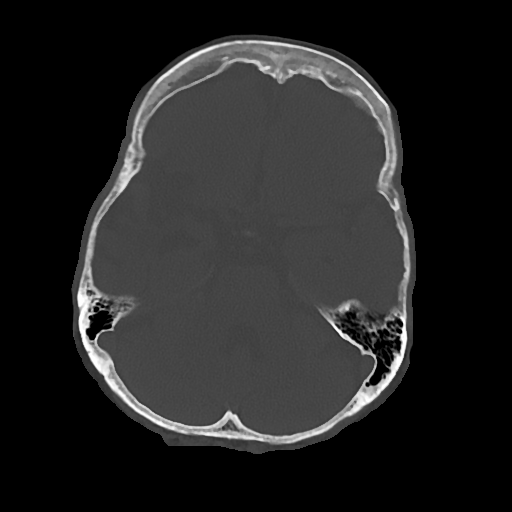

[Series 5: cor soft · coronal · 0.29mm/px · 3 of 65 slices shown]
[im 22/65  brain]
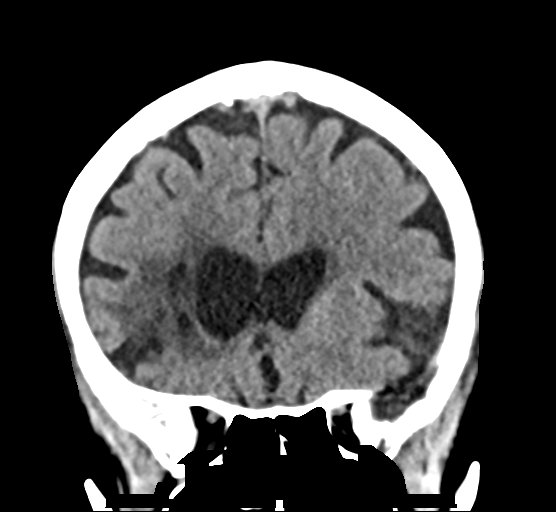
[im 29/65  brain]
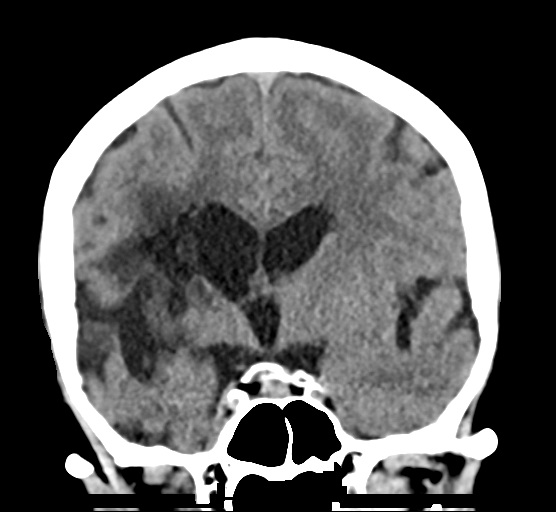
[im 36/65  brain]
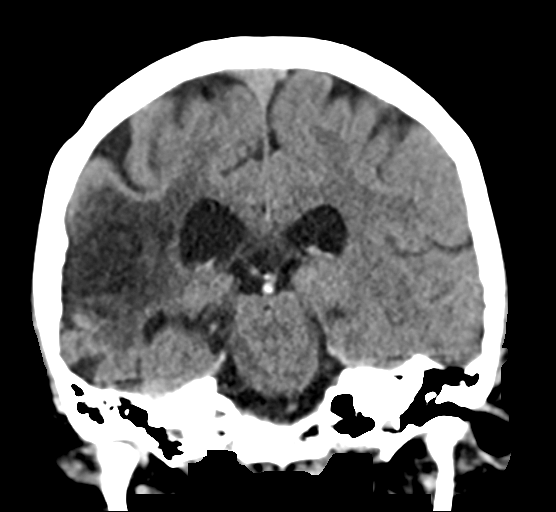

[Series 6: sag soft · sagittal · 0.29mm/px · 3 of 55 slices shown]
[im 19/55  brain]
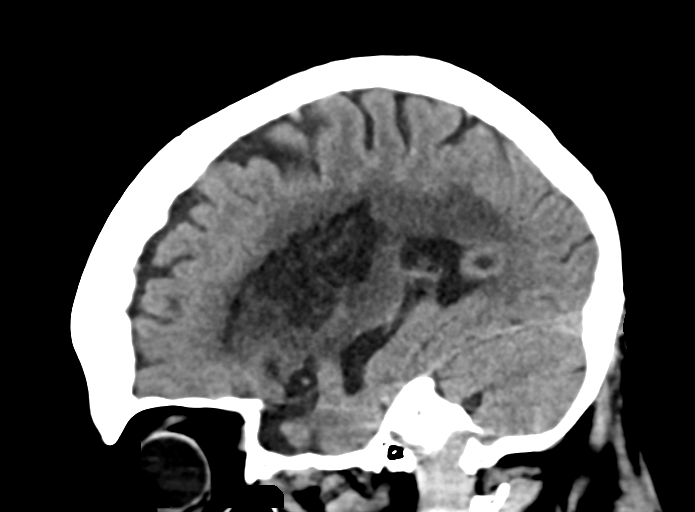
[im 28/55  brain]
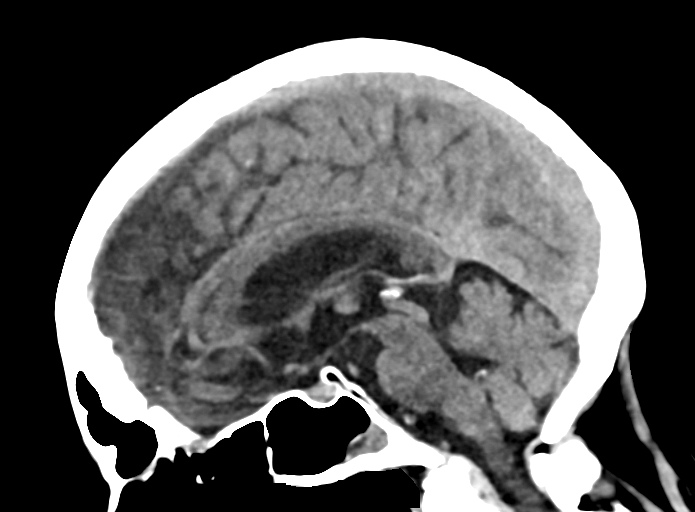
[im 37/55  brain]
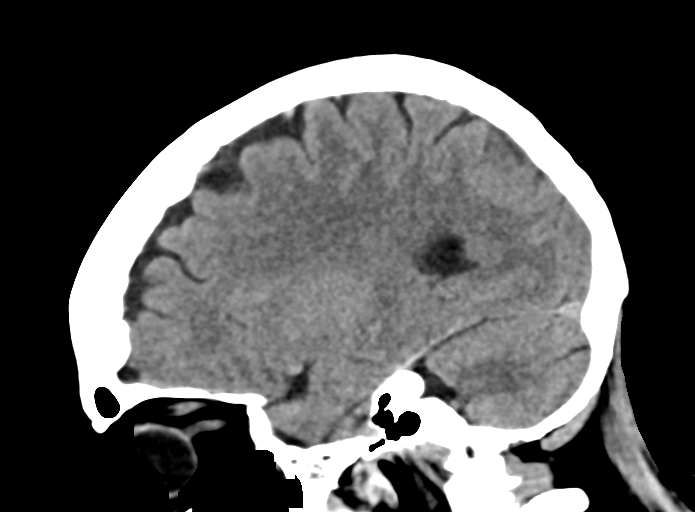

[16 of 47 positions shown; findings below may reference images not displayed]

FINDINGS: Brain: The brainstem and cerebellum are unremarkable. Left cerebral
hemisphere shows minimal small vessel change of the white matter. On
the right, there has been old infarction in the right basal ganglia
and insula which has progressed to atrophy, encephalomalacia and
gliosis. There is old appearing infarction in the right parietal
lobe and temporoparietal junction, which was not visibly affected in
[REDACTED] of last year. This does not appear acute however. There is no
identifiable acute infarction. No mass lesion, hemorrhage,
hydrocephalus or extra-axial collection.

Vascular: There is atherosclerotic calcification of the major
vessels at the base of the brain.

Skull: Negative

Sinuses/Orbits: Some fluid layering in the right division of the
sphenoid sinus. Other sinuses are clear. Orbits are negative.

Other: None
IMPRESSION: Old infarction in the right basal ganglia, insula, temporal lobe and
parietal lobe. The area of involvement is more extensive than was
seen in [REDACTED] of last year, with more involvement of the
temporoparietal junction and posterior parietal lobe, but the
findings do not look acute today. No hemorrhage or mass effect.

## 2021-02-22 IMAGING — US LEFT LOWER EXTREMITY SOFT TISSUE ULTRASOUND LIMITED
1 series · 14 of 15 positions shown · non-contrast
Comparison: 06/11/2018 CT

CLINICAL DATA: Superficial medial left thigh abscess by CT

EXAM:
ULTRASOUND LEFT LOWER EXTREMITY LIMITED
TECHNIQUE: Ultrasound examination of the lower extremity soft tissues was
performed in the area of clinical concern.

[Series 1: left lower extremity soft tissue ultrasound limite · 15 acquisitions, 14 frames shown]
[im 1/15]
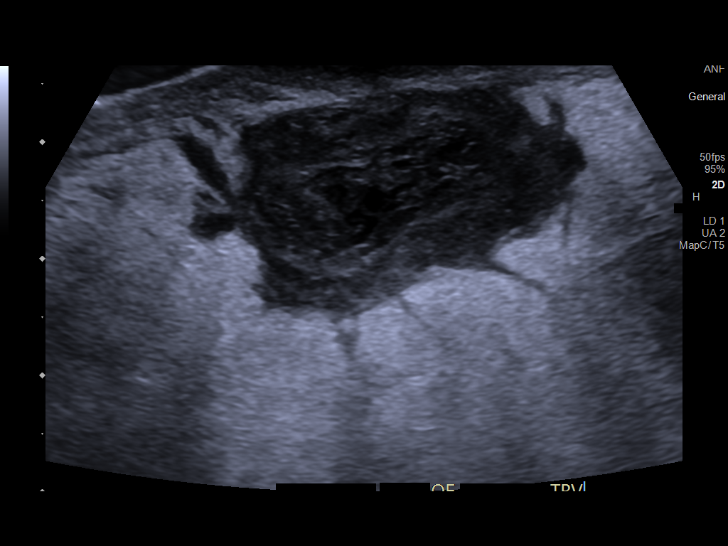
[im 2/15]
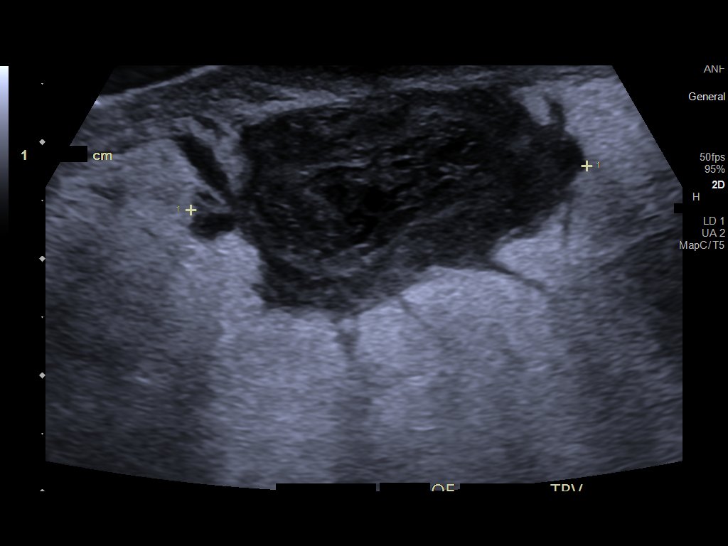
[im 3/15]
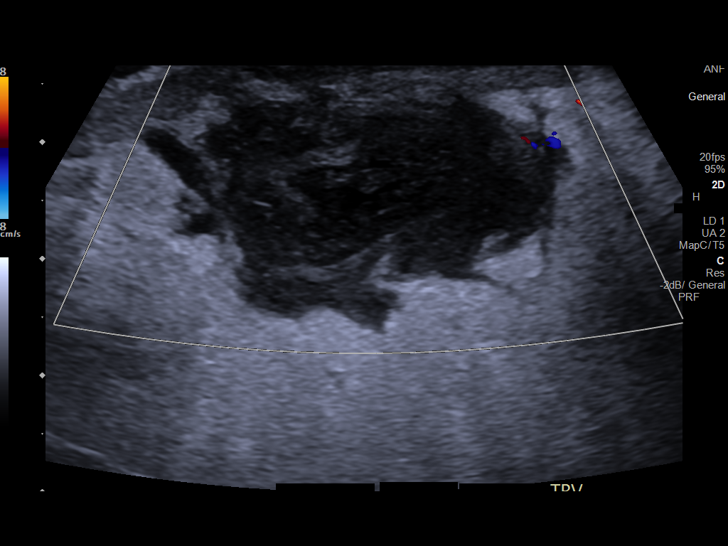
[im 4/15]
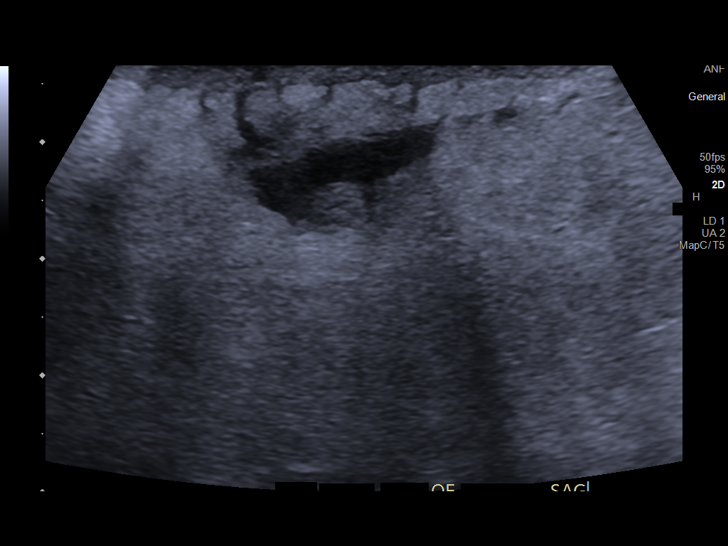
[im 5/15]
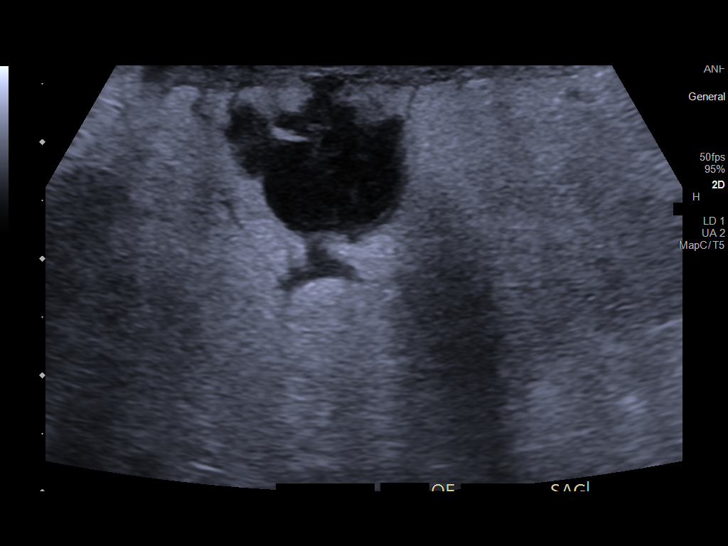
[im 6/15]
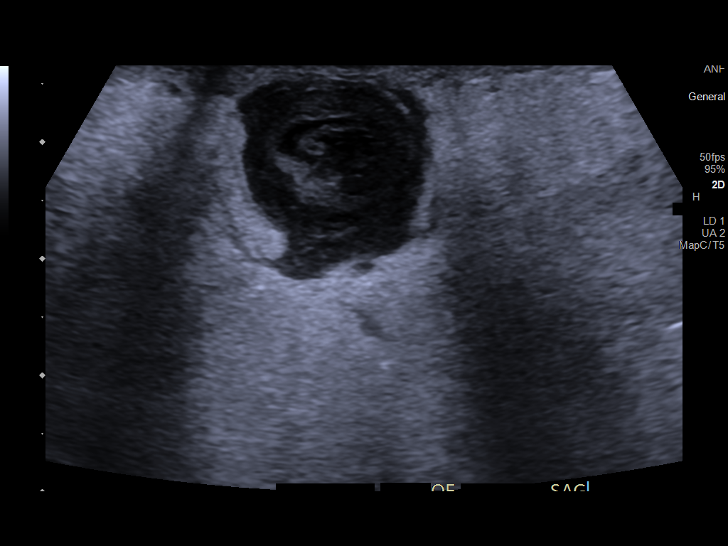
[im 7/15]
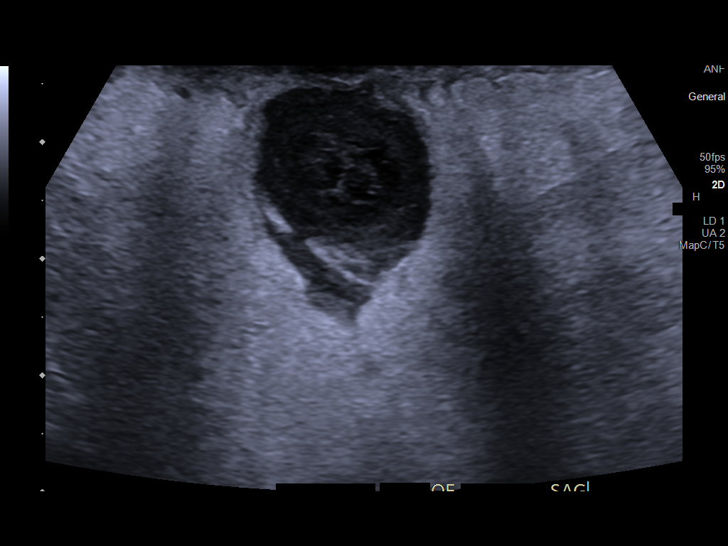
[im 9/15]
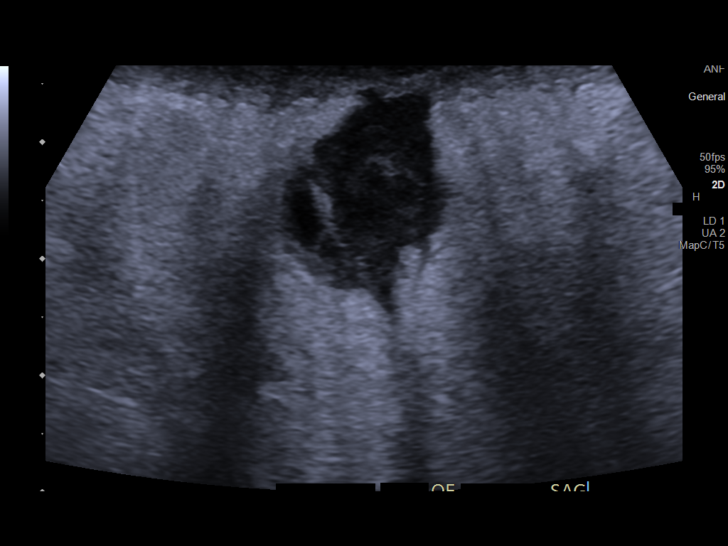
[im 10/15]
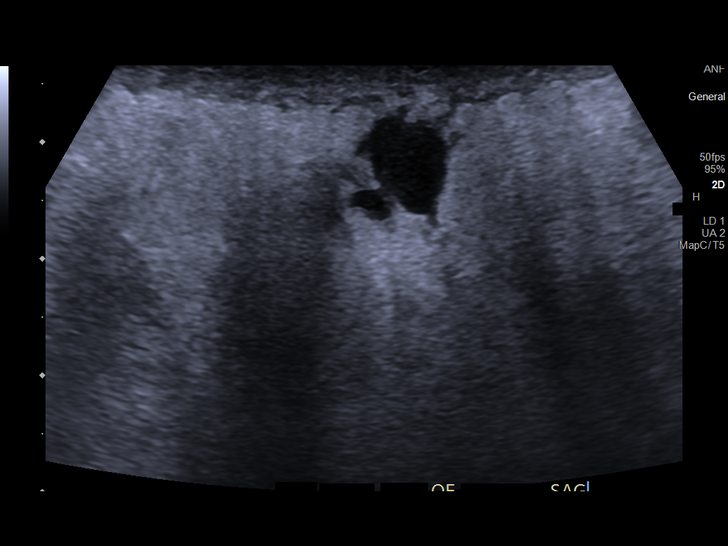
[im 11/15]
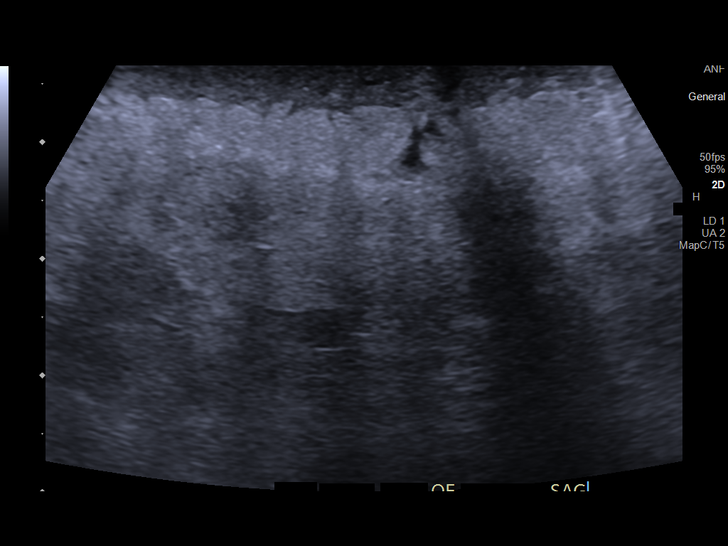
[im 12/15]
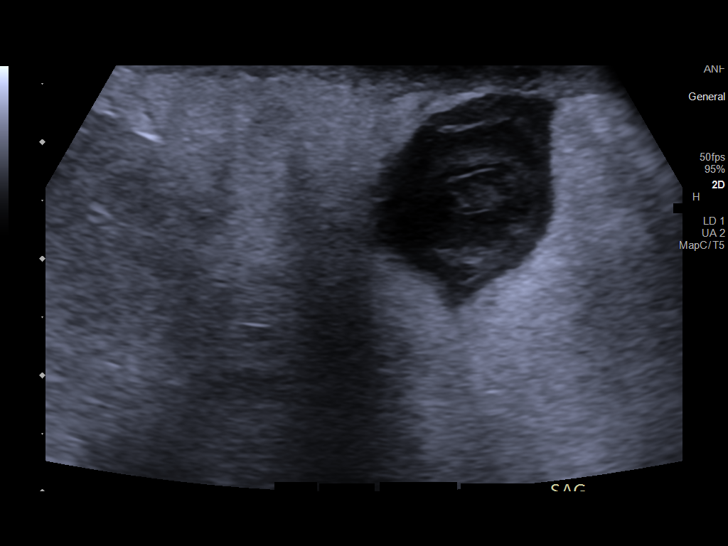
[im 13/15]
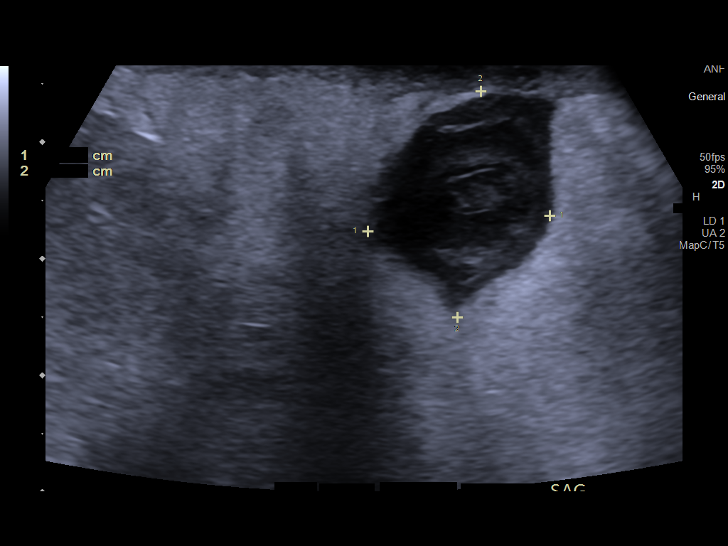
[im 14/15]
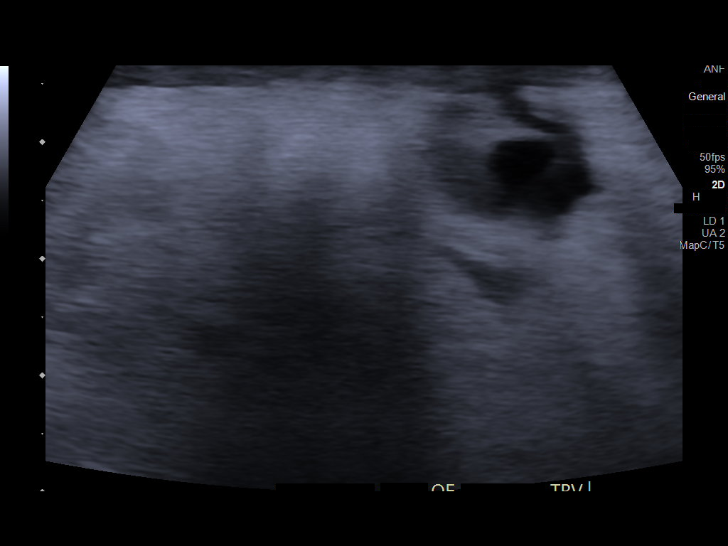
[im 15/15]
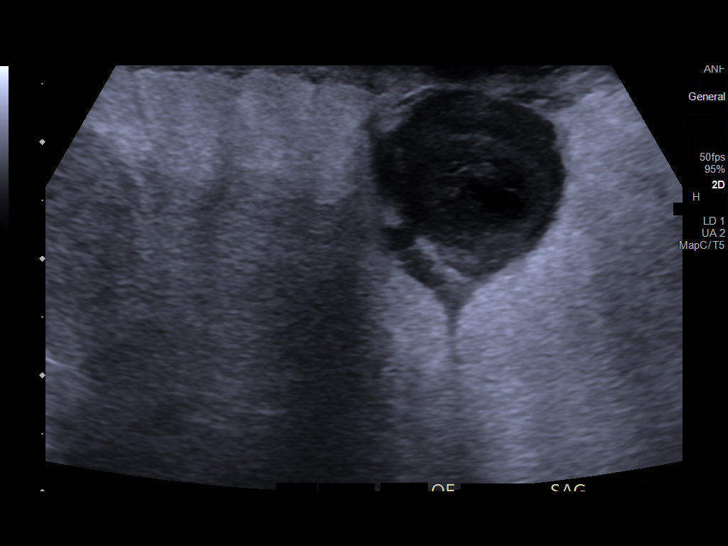

[14 of 15 positions shown; findings below may reference images not displayed]

FINDINGS: Medial left thigh area of concern demonstrates localized skin
thickening. Deep to the skin, there is a superficial subcutaneous
complex fluid collection measuring 1.6 x 2.0 x 3.4 cm. Internal
contents are heterogeneous and hypoechoic. This correlates with the
CT finding.
IMPRESSION: 3.4 cm left medial thigh subcutaneous complex fluid collection
compatible with abscess.
